# Patient Record
Sex: Female | Born: 1995 | Hispanic: No | Marital: Single | Smoking: Former smoker
Health system: Southern US, Community
[De-identification: ages and names within clinical notes are randomized; demographics above are authoritative.]

## PROBLEM LIST (undated history)

## (undated) DIAGNOSIS — M419 Scoliosis, unspecified: Secondary | ICD-10-CM

## (undated) DIAGNOSIS — J45909 Unspecified asthma, uncomplicated: Secondary | ICD-10-CM

## (undated) DIAGNOSIS — N390 Urinary tract infection, site not specified: Secondary | ICD-10-CM

## (undated) DIAGNOSIS — R569 Unspecified convulsions: Secondary | ICD-10-CM

## (undated) HISTORY — PX: ABDOMINAL SURGERY: SHX537

## (undated) HISTORY — DX: Scoliosis, unspecified: M41.9

---
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
## 2014-05-19 ENCOUNTER — Emergency Department (HOSPITAL_COMMUNITY): Payer: Medicaid Other

## 2014-05-19 ENCOUNTER — Encounter (HOSPITAL_COMMUNITY): Payer: Self-pay | Admitting: Emergency Medicine

## 2014-05-19 ENCOUNTER — Inpatient Hospital Stay (HOSPITAL_COMMUNITY)
Admission: EM | Admit: 2014-05-19 | Discharge: 2014-05-24 | DRG: 956 | Disposition: A | Discharge status: Rehabilitation Facility | Payer: Medicaid Other | Attending: Orthopaedic Surgery | Admitting: Orthopaedic Surgery

## 2014-05-19 DIAGNOSIS — Y92488 Other paved roadways as the place of occurrence of the external cause: Secondary | ICD-10-CM

## 2014-05-19 DIAGNOSIS — T148XXA Other injury of unspecified body region, initial encounter: Secondary | ICD-10-CM

## 2014-05-19 DIAGNOSIS — S72392A Other fracture of shaft of left femur, initial encounter for closed fracture: Secondary | ICD-10-CM | POA: Diagnosis present

## 2014-05-19 DIAGNOSIS — D62 Acute posthemorrhagic anemia: Secondary | ICD-10-CM | POA: Diagnosis not present

## 2014-05-19 DIAGNOSIS — R52 Pain, unspecified: Secondary | ICD-10-CM

## 2014-05-19 DIAGNOSIS — S299XXA Unspecified injury of thorax, initial encounter: Secondary | ICD-10-CM

## 2014-05-19 DIAGNOSIS — J9811 Atelectasis: Secondary | ICD-10-CM | POA: Diagnosis not present

## 2014-05-19 DIAGNOSIS — S060X9A Concussion with loss of consciousness of unspecified duration, initial encounter: Secondary | ICD-10-CM | POA: Diagnosis present

## 2014-05-19 DIAGNOSIS — I2699 Other pulmonary embolism without acute cor pulmonale: Secondary | ICD-10-CM

## 2014-05-19 DIAGNOSIS — J9600 Acute respiratory failure, unspecified whether with hypoxia or hypercapnia: Secondary | ICD-10-CM

## 2014-05-19 DIAGNOSIS — S7291XA Unspecified fracture of right femur, initial encounter for closed fracture: Secondary | ICD-10-CM | POA: Diagnosis present

## 2014-05-19 DIAGNOSIS — R339 Retention of urine, unspecified: Secondary | ICD-10-CM | POA: Diagnosis not present

## 2014-05-19 DIAGNOSIS — E872 Acidosis: Secondary | ICD-10-CM | POA: Diagnosis present

## 2014-05-19 DIAGNOSIS — S060XAA Concussion with loss of consciousness status unknown, initial encounter: Secondary | ICD-10-CM | POA: Diagnosis present

## 2014-05-19 DIAGNOSIS — S72391A Other fracture of shaft of right femur, initial encounter for closed fracture: Principal | ICD-10-CM | POA: Diagnosis present

## 2014-05-19 DIAGNOSIS — I959 Hypotension, unspecified: Secondary | ICD-10-CM | POA: Diagnosis present

## 2014-05-19 DIAGNOSIS — M419 Scoliosis, unspecified: Secondary | ICD-10-CM | POA: Diagnosis present

## 2014-05-19 DIAGNOSIS — R609 Edema, unspecified: Secondary | ICD-10-CM | POA: Diagnosis present

## 2014-05-19 DIAGNOSIS — S27322A Contusion of lung, bilateral, initial encounter: Secondary | ICD-10-CM | POA: Diagnosis present

## 2014-05-19 DIAGNOSIS — Z419 Encounter for procedure for purposes other than remedying health state, unspecified: Secondary | ICD-10-CM

## 2014-05-19 DIAGNOSIS — T07XXXA Unspecified multiple injuries, initial encounter: Secondary | ICD-10-CM

## 2014-05-19 DIAGNOSIS — S270XXA Traumatic pneumothorax, initial encounter: Secondary | ICD-10-CM | POA: Diagnosis present

## 2014-05-19 DIAGNOSIS — S7292XA Unspecified fracture of left femur, initial encounter for closed fracture: Secondary | ICD-10-CM

## 2014-05-19 LAB — I-STAT BETA HCG BLOOD, ED (MC, WL, AP ONLY)

## 2014-05-19 LAB — I-STAT CHEM 8, ED
BUN: 7 mg/dL (ref 6–20)
CALCIUM ION: 1.07 mmol/L — AB (ref 1.12–1.23)
CHLORIDE: 108 mmol/L (ref 101–111)
Creatinine, Ser: 0.4 mg/dL — ABNORMAL LOW (ref 0.44–1.00)
Glucose, Bld: 111 mg/dL — ABNORMAL HIGH (ref 65–99)
HEMATOCRIT: 34 % — AB (ref 36.0–46.0)
Hemoglobin: 11.6 g/dL — ABNORMAL LOW (ref 12.0–15.0)
Potassium: 3.5 mmol/L (ref 3.5–5.1)
Sodium: 142 mmol/L (ref 135–145)
TCO2: 16 mmol/L (ref 0–100)

## 2014-05-19 LAB — PROTIME-INR
INR: 1.26 (ref 0.00–1.49)
PROTHROMBIN TIME: 15.9 s — AB (ref 11.6–15.2)

## 2014-05-19 LAB — COMPREHENSIVE METABOLIC PANEL
ALK PHOS: 39 U/L (ref 38–126)
ALT: 30 U/L (ref 14–54)
ANION GAP: 12 (ref 5–15)
AST: 65 U/L — ABNORMAL HIGH (ref 15–41)
Albumin: 2.9 g/dL — ABNORMAL LOW (ref 3.5–5.0)
BUN: 7 mg/dL (ref 6–20)
CALCIUM: 7.4 mg/dL — AB (ref 8.9–10.3)
CO2: 18 mmol/L — ABNORMAL LOW (ref 22–32)
CREATININE: 0.56 mg/dL (ref 0.44–1.00)
Chloride: 108 mmol/L (ref 101–111)
GFR calc Af Amer: >60 mL/min (ref >60)
Glucose, Bld: 108 mg/dL — ABNORMAL HIGH (ref 65–99)
Potassium: 3.4 mmol/L — ABNORMAL LOW (ref 3.5–5.1)
SODIUM: 138 mmol/L (ref 135–145)
Total Bilirubin: 0.3 mg/dL (ref 0.3–1.2)
Total Protein: 5.9 g/dL — ABNORMAL LOW (ref 6.5–8.1)

## 2014-05-19 LAB — CBC
HEMATOCRIT: 32 % — AB (ref 36.0–46.0)
Hemoglobin: 10.5 g/dL — ABNORMAL LOW (ref 12.0–15.0)
MCH: 28.4 pg (ref 26.0–34.0)
MCHC: 32.8 g/dL (ref 30.0–36.0)
MCV: 86.5 fL (ref 78.0–100.0)
Platelets: 191 10*3/uL (ref 150–400)
RBC: 3.7 MIL/uL — ABNORMAL LOW (ref 3.87–5.11)
RDW: 11.6 % (ref 11.5–15.5)
WBC: 8.3 10*3/uL (ref 4.0–10.5)

## 2014-05-19 LAB — I-STAT CG4 LACTIC ACID, ED: Lactic Acid, Venous: 3.78 mmol/L (ref 0.5–2.0)

## 2014-05-19 LAB — CDS SEROLOGY

## 2014-05-19 LAB — ETHANOL: Alcohol, Ethyl (B): <5 mg/dL (ref <5)

## 2014-05-19 MED ORDER — ONDANSETRON HCL 4 MG/2ML IJ SOLN
4.0000 mg | Freq: Once | INTRAMUSCULAR | Status: AC
  Administered 2014-05-19: 4 mg via INTRAVENOUS

## 2014-05-19 MED ORDER — SODIUM CHLORIDE 0.9 % IV SOLN
Freq: Once | INTRAVENOUS | Status: AC
  Administered 2014-05-19: 23:00:00 via INTRAVENOUS

## 2014-05-19 MED ORDER — HYDROMORPHONE HCL 1 MG/ML IJ SOLN
1.0000 mg | Freq: Once | INTRAMUSCULAR | Status: AC
  Administered 2014-05-19: 1 mg via INTRAVENOUS

## 2014-05-19 MED ORDER — FENTANYL CITRATE (PF) 100 MCG/2ML IJ SOLN
50.0000 ug | Freq: Once | INTRAMUSCULAR | Status: AC
Start: 2014-05-19 — End: 2014-05-19
  Administered 2014-05-19: 50 ug via INTRAVENOUS

## 2014-05-19 MED ORDER — FENTANYL CITRATE (PF) 100 MCG/2ML IJ SOLN
50.0000 ug | Freq: Once | INTRAMUSCULAR | Status: AC
  Administered 2014-05-19: 50 ug via INTRAVENOUS

## 2014-05-19 MED ORDER — SODIUM CHLORIDE 0.9 % IV BOLUS (SEPSIS)
1000.0000 mL | Freq: Once | INTRAVENOUS | Status: AC
  Administered 2014-05-19: 1000 mL via INTRAVENOUS

## 2014-05-19 MED ORDER — HYDROMORPHONE HCL 1 MG/ML IJ SOLN
INTRAMUSCULAR | Status: AC
  Filled 2014-05-19: qty 1

## 2014-05-19 NOTE — ED Notes (Signed)
Duplicate orders discontinued.  
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
## 2014-05-19 NOTE — Progress Notes (Signed)
Orthopedic Tech Progress Note Patient Details:  Theophilus BonesLena Howdeshell 03/03/1995 [id number]  Patient ID: Theophilus BonesLena Buckel, female   DOB: 03/03/1995, 19 y.o.   MRN: [id number]   Shawnie PonsCammer, Nel Stoneking Carol 05/19/2014, 10:47 PMlevel 1 trauma

## 2014-05-19 NOTE — ED Notes (Signed)
Ortho tech paged again

## 2014-05-19 NOTE — ED Notes (Signed)
Ortho tech paged  
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
## 2014-05-19 NOTE — ED Notes (Signed)
Dr Rhunette CroftNanavati given a copy of lactic acid results 3.78

## 2014-05-20 ENCOUNTER — Inpatient Hospital Stay (HOSPITAL_COMMUNITY): Payer: Medicaid Other

## 2014-05-20 ENCOUNTER — Encounter (HOSPITAL_COMMUNITY): Payer: Self-pay | Admitting: Certified Registered Nurse Anesthetist

## 2014-05-20 ENCOUNTER — Inpatient Hospital Stay (HOSPITAL_COMMUNITY): Payer: Medicaid Other | Admitting: Anesthesiology

## 2014-05-20 ENCOUNTER — Encounter (HOSPITAL_COMMUNITY): Admission: EM | Disposition: A | Discharge status: Rehabilitation Facility | Payer: Self-pay | Source: Home / Self Care

## 2014-05-20 DIAGNOSIS — T07XXXA Unspecified multiple injuries, initial encounter: Secondary | ICD-10-CM

## 2014-05-20 DIAGNOSIS — S72301S Unspecified fracture of shaft of right femur, sequela: Secondary | ICD-10-CM | POA: Diagnosis not present

## 2014-05-20 DIAGNOSIS — S069X1S Unspecified intracranial injury with loss of consciousness of 30 minutes or less, sequela: Secondary | ICD-10-CM | POA: Diagnosis not present

## 2014-05-20 DIAGNOSIS — S72392A Other fracture of shaft of left femur, initial encounter for closed fracture: Secondary | ICD-10-CM | POA: Diagnosis present

## 2014-05-20 DIAGNOSIS — I959 Hypotension, unspecified: Secondary | ICD-10-CM | POA: Diagnosis present

## 2014-05-20 DIAGNOSIS — D62 Acute posthemorrhagic anemia: Secondary | ICD-10-CM | POA: Diagnosis not present

## 2014-05-20 DIAGNOSIS — R339 Retention of urine, unspecified: Secondary | ICD-10-CM | POA: Diagnosis not present

## 2014-05-20 DIAGNOSIS — S060X9A Concussion with loss of consciousness of unspecified duration, initial encounter: Secondary | ICD-10-CM | POA: Diagnosis present

## 2014-05-20 DIAGNOSIS — S270XXA Traumatic pneumothorax, initial encounter: Secondary | ICD-10-CM | POA: Diagnosis present

## 2014-05-20 DIAGNOSIS — S7292XA Unspecified fracture of left femur, initial encounter for closed fracture: Secondary | ICD-10-CM

## 2014-05-20 DIAGNOSIS — S7291XA Unspecified fracture of right femur, initial encounter for closed fracture: Secondary | ICD-10-CM | POA: Diagnosis present

## 2014-05-20 DIAGNOSIS — S7291XS Unspecified fracture of right femur, sequela: Secondary | ICD-10-CM | POA: Diagnosis not present

## 2014-05-20 DIAGNOSIS — M419 Scoliosis, unspecified: Secondary | ICD-10-CM | POA: Diagnosis present

## 2014-05-20 DIAGNOSIS — Y92488 Other paved roadways as the place of occurrence of the external cause: Secondary | ICD-10-CM | POA: Diagnosis not present

## 2014-05-20 DIAGNOSIS — J9811 Atelectasis: Secondary | ICD-10-CM | POA: Diagnosis not present

## 2014-05-20 DIAGNOSIS — S060XAA Concussion with loss of consciousness status unknown, initial encounter: Secondary | ICD-10-CM

## 2014-05-20 DIAGNOSIS — S72302S Unspecified fracture of shaft of left femur, sequela: Secondary | ICD-10-CM | POA: Diagnosis not present

## 2014-05-20 DIAGNOSIS — S7291XF Unspecified fracture of right femur, subsequent encounter for open fracture type IIIA, IIIB, or IIIC with routine healing: Secondary | ICD-10-CM | POA: Diagnosis not present

## 2014-05-20 DIAGNOSIS — S7292XF Unspecified fracture of left femur, subsequent encounter for open fracture type IIIA, IIIB, or IIIC with routine healing: Secondary | ICD-10-CM | POA: Diagnosis not present

## 2014-05-20 DIAGNOSIS — M79606 Pain in leg, unspecified: Secondary | ICD-10-CM | POA: Diagnosis present

## 2014-05-20 DIAGNOSIS — S27322A Contusion of lung, bilateral, initial encounter: Secondary | ICD-10-CM

## 2014-05-20 DIAGNOSIS — S060X1S Concussion with loss of consciousness of 30 minutes or less, sequela: Secondary | ICD-10-CM | POA: Diagnosis not present

## 2014-05-20 DIAGNOSIS — S7292XS Unspecified fracture of left femur, sequela: Secondary | ICD-10-CM | POA: Diagnosis not present

## 2014-05-20 DIAGNOSIS — S062X1S Diffuse traumatic brain injury with loss of consciousness of 30 minutes or less, sequela: Secondary | ICD-10-CM | POA: Diagnosis not present

## 2014-05-20 DIAGNOSIS — S062X1D Diffuse traumatic brain injury with loss of consciousness of 30 minutes or less, subsequent encounter: Secondary | ICD-10-CM | POA: Diagnosis not present

## 2014-05-20 DIAGNOSIS — S72391A Other fracture of shaft of right femur, initial encounter for closed fracture: Secondary | ICD-10-CM | POA: Diagnosis present

## 2014-05-20 DIAGNOSIS — E872 Acidosis: Secondary | ICD-10-CM | POA: Diagnosis present

## 2014-05-20 DIAGNOSIS — R609 Edema, unspecified: Secondary | ICD-10-CM | POA: Diagnosis present

## 2014-05-20 HISTORY — DX: Unspecified fracture of right femur, initial encounter for closed fracture: S72.91XA

## 2014-05-20 HISTORY — DX: Contusion of lung, bilateral, initial encounter: S27.322A

## 2014-05-20 HISTORY — PX: FEMUR IM NAIL: SHX1597

## 2014-05-20 HISTORY — DX: Rider (driver) (passenger) of other motorcycle injured in unspecified traffic accident, initial encounter: V29.99XA

## 2014-05-20 HISTORY — DX: Concussion with loss of consciousness status unknown, initial encounter: S06.0XAA

## 2014-05-20 LAB — PREPARE FRESH FROZEN PLASMA
UNIT DIVISION: 0
Unit division: 0

## 2014-05-20 LAB — CBC
HCT: 32.2 % — ABNORMAL LOW (ref 36.0–46.0)
HEMOGLOBIN: 10.4 g/dL — AB (ref 12.0–15.0)
MCH: 28.4 pg (ref 26.0–34.0)
MCHC: 32.3 g/dL (ref 30.0–36.0)
MCV: 88 fL (ref 78.0–100.0)
Platelets: 175 10*3/uL (ref 150–400)
RBC: 3.66 MIL/uL — AB (ref 3.87–5.11)
RDW: 11.9 % (ref 11.5–15.5)
WBC: 10 10*3/uL (ref 4.0–10.5)

## 2014-05-20 LAB — PROTIME-INR
INR: 1.19 (ref 0.00–1.49)
Prothrombin Time: 15.2 seconds (ref 11.6–15.2)

## 2014-05-20 LAB — BASIC METABOLIC PANEL
Anion gap: 7 (ref 5–15)
CHLORIDE: 112 mmol/L — AB (ref 101–111)
CO2: 20 mmol/L — ABNORMAL LOW (ref 22–32)
CREATININE: 0.61 mg/dL (ref 0.44–1.00)
Calcium: 7.2 mg/dL — ABNORMAL LOW (ref 8.9–10.3)
GFR calc Af Amer: >60 mL/min (ref >60)
GFR calc non Af Amer: >60 mL/min (ref >60)
Glucose, Bld: 111 mg/dL — ABNORMAL HIGH (ref 65–99)
Potassium: 4.1 mmol/L (ref 3.5–5.1)
Sodium: 139 mmol/L (ref 135–145)

## 2014-05-20 LAB — MRSA PCR SCREENING: MRSA BY PCR: NEGATIVE

## 2014-05-20 LAB — ABO/RH: ABO/RH(D): O POS

## 2014-05-20 LAB — TSH: TSH: 2.656 u[IU]/mL (ref 0.350–4.500)

## 2014-05-20 SURGERY — INSERTION, INTRAMEDULLARY ROD, FEMUR, RETROGRADE
Anesthesia: General | Site: Leg Upper | Laterality: Bilateral

## 2014-05-20 MED ORDER — LIDOCAINE HCL (CARDIAC) 20 MG/ML IV SOLN
INTRAVENOUS | Status: AC
  Filled 2014-05-20: qty 5

## 2014-05-20 MED ORDER — LIDOCAINE HCL (CARDIAC) 20 MG/ML IV SOLN
INTRAVENOUS | Status: DC | PRN
  Administered 2014-05-20: 40 mg via INTRAVENOUS

## 2014-05-20 MED ORDER — HYDROMORPHONE HCL 1 MG/ML IJ SOLN
1.0000 mg | Freq: Once | INTRAMUSCULAR | Status: AC
  Administered 2014-05-20: 1 mg via INTRAVENOUS

## 2014-05-20 MED ORDER — BUPIVACAINE HCL (PF) 0.25 % IJ SOLN
INTRAMUSCULAR | Status: AC
  Filled 2014-05-20: qty 60

## 2014-05-20 MED ORDER — ACETAMINOPHEN 10 MG/ML IV SOLN: 650.0000 mg | Freq: Once | INTRAVENOUS | Status: DC

## 2014-05-20 MED ORDER — POTASSIUM CHLORIDE IN NACL 20-0.9 MEQ/L-% IV SOLN
INTRAVENOUS | Status: DC
  Administered 2014-05-20: 03:00:00 via INTRAVENOUS
  Filled 2014-05-20 (×3): qty 1000

## 2014-05-20 MED ORDER — ONDANSETRON HCL 4 MG/2ML IJ SOLN
INTRAMUSCULAR | Status: DC | PRN
  Administered 2014-05-20: 4 mg via INTRAVENOUS

## 2014-05-20 MED ORDER — OXYCODONE HCL 5 MG PO TABS: 5.0000 mg | ORAL_TABLET | ORAL | Status: DC | PRN

## 2014-05-20 MED ORDER — METHOCARBAMOL 500 MG PO TABS
500.0000 mg | ORAL_TABLET | Freq: Four times a day (QID) | ORAL | Status: DC | PRN
  Administered 2014-05-20: 500 mg via ORAL
  Filled 2014-05-20 (×2): qty 1

## 2014-05-20 MED ORDER — OXYCODONE HCL 5 MG/5ML PO SOLN: 5.0000 mg | Freq: Once | ORAL | Status: AC | PRN

## 2014-05-20 MED ORDER — PHENYLEPHRINE HCL 10 MG/ML IJ SOLN
10.0000 mg | INTRAVENOUS | Status: DC | PRN
  Administered 2014-05-20: 10 ug/min via INTRAVENOUS

## 2014-05-20 MED ORDER — HYDROMORPHONE HCL 1 MG/ML IJ SOLN
0.5000 mg | INTRAMUSCULAR | Status: DC | PRN
  Administered 2014-05-20 – 2014-05-22 (×6): 0.5 mg via INTRAVENOUS
  Filled 2014-05-20 (×6): qty 1

## 2014-05-20 MED ORDER — ACETAMINOPHEN 650 MG RE SUPP: 650.0000 mg | Freq: Four times a day (QID) | RECTAL | Status: DC | PRN

## 2014-05-20 MED ORDER — HYDROCODONE-ACETAMINOPHEN 7.5-325 MG PO TABS: 1.0000 | ORAL_TABLET | Freq: Once | ORAL | Status: DC | PRN

## 2014-05-20 MED ORDER — ONDANSETRON HCL 4 MG PO TABS: 4.0000 mg | ORAL_TABLET | Freq: Four times a day (QID) | ORAL | Status: DC | PRN

## 2014-05-20 MED ORDER — ENOXAPARIN SODIUM 30 MG/0.3ML ~~LOC~~ SOLN
30.0000 mg | SUBCUTANEOUS | Status: DC
  Administered 2014-05-21 – 2014-05-24 (×4): 30 mg via SUBCUTANEOUS
  Filled 2014-05-20 (×5): qty 0.3

## 2014-05-20 MED ORDER — VECURONIUM BROMIDE 10 MG IV SOLR
INTRAVENOUS | Status: DC | PRN
  Administered 2014-05-20: 2 mg via INTRAVENOUS

## 2014-05-20 MED ORDER — OXYCODONE HCL 5 MG PO TABS
5.0000 mg | ORAL_TABLET | Freq: Once | ORAL | Status: AC | PRN
  Administered 2014-05-20: 5 mg via ORAL

## 2014-05-20 MED ORDER — HYDROMORPHONE HCL 1 MG/ML IJ SOLN
INTRAMUSCULAR | Status: AC
  Filled 2014-05-20: qty 1

## 2014-05-20 MED ORDER — CEFAZOLIN SODIUM-DEXTROSE 2-3 GM-% IV SOLR
INTRAVENOUS | Status: DC | PRN
  Administered 2014-05-20: 2 g via INTRAVENOUS

## 2014-05-20 MED ORDER — HYDROCODONE-ACETAMINOPHEN 10-325 MG PO TABS
0.5000 | ORAL_TABLET | ORAL | Status: DC | PRN
  Administered 2014-05-20 (×2): 2 via ORAL
  Administered 2014-05-21 – 2014-05-23 (×5): 1 via ORAL
  Administered 2014-05-24 (×2): 2 via ORAL
  Filled 2014-05-20 (×3): qty 1
  Filled 2014-05-20 (×2): qty 2
  Filled 2014-05-20: qty 1
  Filled 2014-05-20 (×3): qty 2

## 2014-05-20 MED ORDER — HEPARIN SODIUM (PORCINE) 5000 UNIT/ML IJ SOLN
5000.0000 [IU] | Freq: Once | INTRAMUSCULAR | Status: AC
  Administered 2014-05-20: 5000 [IU] via SUBCUTANEOUS
  Filled 2014-05-20: qty 1

## 2014-05-20 MED ORDER — PANTOPRAZOLE SODIUM 40 MG PO TBEC: 40.0000 mg | DELAYED_RELEASE_TABLET | Freq: Every day | ORAL | Status: DC

## 2014-05-20 MED ORDER — PROPOFOL 10 MG/ML IV BOLUS
INTRAVENOUS | Status: AC
  Filled 2014-05-20: qty 20

## 2014-05-20 MED ORDER — DIPHENHYDRAMINE HCL 12.5 MG/5ML PO ELIX
25.0000 mg | ORAL_SOLUTION | ORAL | Status: DC | PRN
  Filled 2014-05-20: qty 10

## 2014-05-20 MED ORDER — ALBUMIN HUMAN 5 % IV SOLN
INTRAVENOUS | Status: DC | PRN
  Administered 2014-05-20: 08:00:00 via INTRAVENOUS

## 2014-05-20 MED ORDER — DOCUSATE SODIUM 100 MG PO CAPS
100.0000 mg | ORAL_CAPSULE | Freq: Two times a day (BID) | ORAL | Status: DC
  Administered 2014-05-20 – 2014-05-24 (×6): 100 mg via ORAL
  Filled 2014-05-20 (×5): qty 1

## 2014-05-20 MED ORDER — 0.9 % SODIUM CHLORIDE (POUR BTL) OPTIME
TOPICAL | Status: DC | PRN
  Administered 2014-05-20: 1000 mL

## 2014-05-20 MED ORDER — PANTOPRAZOLE SODIUM 40 MG IV SOLR
40.0000 mg | Freq: Every day | INTRAVENOUS | Status: DC
  Filled 2014-05-20: qty 40

## 2014-05-20 MED ORDER — POLYETHYLENE GLYCOL 3350 17 G PO PACK
17.0000 g | PACK | Freq: Every day | ORAL | Status: DC
  Administered 2014-05-20 – 2014-05-24 (×3): 17 g via ORAL
  Filled 2014-05-20 (×5): qty 1

## 2014-05-20 MED ORDER — KETOROLAC TROMETHAMINE 30 MG/ML IJ SOLN
30.0000 mg | Freq: Once | INTRAMUSCULAR | Status: AC | PRN
  Administered 2014-05-20: 30 mg via INTRAVENOUS

## 2014-05-20 MED ORDER — WHITE PETROLATUM GEL
Status: AC
  Administered 2014-05-20: 02:00:00
  Filled 2014-05-20: qty 1

## 2014-05-20 MED ORDER — FENTANYL CITRATE (PF) 250 MCG/5ML IJ SOLN
INTRAMUSCULAR | Status: AC
  Filled 2014-05-20: qty 5

## 2014-05-20 MED ORDER — NEOSTIGMINE METHYLSULFATE 10 MG/10ML IV SOLN
INTRAVENOUS | Status: DC | PRN
  Administered 2014-05-20: 2 mg via INTRAVENOUS

## 2014-05-20 MED ORDER — ONDANSETRON HCL 4 MG/2ML IJ SOLN
4.0000 mg | Freq: Four times a day (QID) | INTRAMUSCULAR | Status: DC | PRN
  Administered 2014-05-20 – 2014-05-22 (×4): 4 mg via INTRAVENOUS
  Filled 2014-05-20 (×5): qty 2

## 2014-05-20 MED ORDER — PROPOFOL 10 MG/ML IV BOLUS
INTRAVENOUS | Status: DC | PRN
  Administered 2014-05-20: 100 mg via INTRAVENOUS

## 2014-05-20 MED ORDER — MEPERIDINE HCL 25 MG/ML IJ SOLN: 6.2500 mg | INTRAMUSCULAR | Status: DC | PRN

## 2014-05-20 MED ORDER — ENOXAPARIN SODIUM 30 MG/0.3ML ~~LOC~~ SOLN
30.0000 mg | SUBCUTANEOUS | Status: DC
  Filled 2014-05-20: qty 0.3

## 2014-05-20 MED ORDER — MIDAZOLAM HCL 5 MG/5ML IJ SOLN
INTRAMUSCULAR | Status: DC | PRN
  Administered 2014-05-20 (×2): 1 mg via INTRAVENOUS

## 2014-05-20 MED ORDER — OXYCODONE HCL 5 MG PO TABS
ORAL_TABLET | ORAL | Status: AC
  Filled 2014-05-20: qty 1

## 2014-05-20 MED ORDER — GLYCOPYRROLATE 0.2 MG/ML IJ SOLN
INTRAMUSCULAR | Status: DC | PRN
  Administered 2014-05-20: 0.2 mg via INTRAVENOUS

## 2014-05-20 MED ORDER — MIDAZOLAM HCL 2 MG/2ML IJ SOLN
INTRAMUSCULAR | Status: AC
  Filled 2014-05-20: qty 2

## 2014-05-20 MED ORDER — ACETAMINOPHEN 325 MG PO TABS
650.0000 mg | ORAL_TABLET | Freq: Once | ORAL | Status: AC
  Administered 2014-05-20: 650 mg via ORAL
  Filled 2014-05-20: qty 2

## 2014-05-20 MED ORDER — ONDANSETRON HCL 4 MG/2ML IJ SOLN: 4.0000 mg | Freq: Four times a day (QID) | INTRAMUSCULAR | Status: DC | PRN

## 2014-05-20 MED ORDER — PHENYLEPHRINE HCL 10 MG/ML IJ SOLN
INTRAMUSCULAR | Status: DC | PRN
  Administered 2014-05-20 (×2): 80 ug via INTRAVENOUS

## 2014-05-20 MED ORDER — ENOXAPARIN SODIUM 40 MG/0.4ML ~~LOC~~ SOLN
40.0000 mg | SUBCUTANEOUS | Status: DC
  Filled 2014-05-20: qty 0.4

## 2014-05-20 MED ORDER — HYDROMORPHONE HCL 1 MG/ML IJ SOLN
INTRAMUSCULAR | Status: AC
  Administered 2014-05-20: 02:00:00
  Filled 2014-05-20: qty 1

## 2014-05-20 MED ORDER — FENTANYL CITRATE (PF) 100 MCG/2ML IJ SOLN
INTRAMUSCULAR | Status: DC | PRN
  Administered 2014-05-20: 50 ug via INTRAVENOUS
  Administered 2014-05-20: 25 ug via INTRAVENOUS
  Administered 2014-05-20 (×2): 50 ug via INTRAVENOUS
  Administered 2014-05-20: 25 ug via INTRAVENOUS
  Administered 2014-05-20: 100 ug via INTRAVENOUS

## 2014-05-20 MED ORDER — KETOROLAC TROMETHAMINE 30 MG/ML IJ SOLN
INTRAMUSCULAR | Status: AC
  Filled 2014-05-20: qty 1

## 2014-05-20 MED ORDER — ROCURONIUM BROMIDE 100 MG/10ML IV SOLN
INTRAVENOUS | Status: DC | PRN
Start: 2014-05-20 — End: 2014-05-20
  Administered 2014-05-20: 40 mg via INTRAVENOUS
  Administered 2014-05-20: 10 mg via INTRAVENOUS

## 2014-05-20 MED ORDER — METOCLOPRAMIDE HCL 5 MG/ML IJ SOLN
5.0000 mg | Freq: Three times a day (TID) | INTRAMUSCULAR | Status: DC | PRN
  Administered 2014-05-21: 10 mg via INTRAVENOUS
  Filled 2014-05-20 (×2): qty 2

## 2014-05-20 MED ORDER — ACETAMINOPHEN 325 MG PO TABS
650.0000 mg | ORAL_TABLET | Freq: Four times a day (QID) | ORAL | Status: DC | PRN
  Administered 2014-05-21: 475 mg via ORAL
  Administered 2014-05-21 – 2014-05-22 (×2): 650 mg via ORAL
  Filled 2014-05-20 (×4): qty 2

## 2014-05-20 MED ORDER — ONDANSETRON HCL 4 MG/2ML IJ SOLN: 4.0000 mg | Freq: Once | INTRAMUSCULAR | Status: DC | PRN

## 2014-05-20 MED ORDER — ROCURONIUM BROMIDE 50 MG/5ML IV SOLN
INTRAVENOUS | Status: AC
  Filled 2014-05-20: qty 1

## 2014-05-20 MED ORDER — METHOCARBAMOL 500 MG PO TABS
ORAL_TABLET | ORAL | Status: AC
  Filled 2014-05-20: qty 1

## 2014-05-20 MED ORDER — METOCLOPRAMIDE HCL 5 MG PO TABS
5.0000 mg | ORAL_TABLET | Freq: Three times a day (TID) | ORAL | Status: DC | PRN
  Filled 2014-05-20: qty 2

## 2014-05-20 MED ORDER — HYDROMORPHONE HCL 1 MG/ML IJ SOLN
0.2500 mg | INTRAMUSCULAR | Status: DC | PRN
  Administered 2014-05-20: 0.5 mg via INTRAVENOUS

## 2014-05-20 MED ORDER — LACTATED RINGERS IV SOLN
INTRAVENOUS | Status: DC | PRN
  Administered 2014-05-20 (×2): via INTRAVENOUS

## 2014-05-20 MED ORDER — SODIUM CHLORIDE 0.9 % IV SOLN
INTRAVENOUS | Status: DC
  Administered 2014-05-20: 21:00:00 via INTRAVENOUS
  Administered 2014-05-21: 125 mL/h via INTRAVENOUS
  Administered 2014-05-22: 03:00:00 via INTRAVENOUS

## 2014-05-20 MED ORDER — ONDANSETRON HCL 4 MG/2ML IJ SOLN
INTRAMUSCULAR | Status: AC
  Filled 2014-05-20: qty 2

## 2014-05-20 MED ORDER — CEFAZOLIN SODIUM-DEXTROSE 2-3 GM-% IV SOLR
2.0000 g | Freq: Four times a day (QID) | INTRAVENOUS | Status: AC
  Administered 2014-05-20 – 2014-05-21 (×3): 2 g via INTRAVENOUS
  Filled 2014-05-20 (×3): qty 50

## 2014-05-20 MED ORDER — METHOCARBAMOL 1000 MG/10ML IJ SOLN
500.0000 mg | Freq: Four times a day (QID) | INTRAVENOUS | Status: DC | PRN
  Administered 2014-05-22 – 2014-05-23 (×2): 500 mg via INTRAVENOUS
  Filled 2014-05-20 (×4): qty 5

## 2014-05-20 MED ORDER — HYDROMORPHONE HCL 1 MG/ML IJ SOLN
1.0000 mg | INTRAMUSCULAR | Status: DC | PRN
  Administered 2014-05-20: 1 mg via INTRAVENOUS
  Filled 2014-05-20: qty 1

## 2014-05-20 SURGICAL SUPPLY — 68 items
BIT DRILL LONG 4.0 (BIT) ×1 IMPLANT
BIT DRILL SHORT 4.0 (BIT) ×3 IMPLANT
BLADE SURG 15 STRL LF DISP TIS (BLADE) ×1 IMPLANT
BLADE SURG 15 STRL SS (BLADE) ×1
BNDG COHESIVE 6X5 TAN STRL LF (GAUZE/BANDAGES/DRESSINGS) ×4 IMPLANT
COVER SURGICAL LIGHT HANDLE (MISCELLANEOUS) ×4 IMPLANT
DRAPE C-ARM 42X72 X-RAY (DRAPES) ×4 IMPLANT
DRAPE C-ARMOR (DRAPES) ×4 IMPLANT
DRAPE EXTREMITY BILATERAL (DRAPE) ×2 IMPLANT
DRAPE IMP U-DRAPE 54X76 (DRAPES) ×2 IMPLANT
DRAPE INCISE IOBAN 66X45 STRL (DRAPES) ×2 IMPLANT
DRAPE ORTHO SPLIT 77X108 STRL (DRAPES) ×2
DRAPE PROXIMA HALF (DRAPES) ×4 IMPLANT
DRAPE SURG ORHT 6 SPLT 77X108 (DRAPES) ×2 IMPLANT
DRAPE U-SHAPE 47X51 STRL (DRAPES) ×4 IMPLANT
DRILL BIT LONG 4.0 (BIT) ×2
DRILL BIT SHORT 4.0 (BIT) ×3
DRSG MEPILEX BORDER 4X4 (GAUZE/BANDAGES/DRESSINGS) ×6 IMPLANT
DRSG MEPILEX BORDER 4X8 (GAUZE/BANDAGES/DRESSINGS) ×6 IMPLANT
DURAPREP 26ML APPLICATOR (WOUND CARE) ×4 IMPLANT
ELECT REM PT RETURN 9FT ADLT (ELECTROSURGICAL) ×2
ELECTRODE REM PT RTRN 9FT ADLT (ELECTROSURGICAL) ×1 IMPLANT
FACESHIELD STD STERILE (MASK) ×6 IMPLANT
GAUZE XEROFORM 5X9 LF (GAUZE/BANDAGES/DRESSINGS) ×2 IMPLANT
GLOVE BIO SURGEON STRL SZ7 (GLOVE) ×2 IMPLANT
GLOVE BIOGEL PI IND STRL 6.5 (GLOVE) ×1 IMPLANT
GLOVE BIOGEL PI IND STRL 7.0 (GLOVE) ×2 IMPLANT
GLOVE BIOGEL PI INDICATOR 6.5 (GLOVE) ×1
GLOVE BIOGEL PI INDICATOR 7.0 (GLOVE) ×2
GLOVE ECLIPSE 6.5 STRL STRAW (GLOVE) ×2 IMPLANT
GLOVE NEODERM STRL 7.5 LF PF (GLOVE) ×1 IMPLANT
GLOVE SURG NEODERM 7.5  LF PF (GLOVE) ×1
GLOVE SURG SS PI 7.0 STRL IVOR (GLOVE) ×2 IMPLANT
GLOVE SURG SS PI 8.0 STRL IVOR (GLOVE) ×4 IMPLANT
GOWN STRL REIN XL XLG (GOWN DISPOSABLE) ×2 IMPLANT
GOWN STRL REUS W/ TWL LRG LVL3 (GOWN DISPOSABLE) ×2 IMPLANT
GOWN STRL REUS W/ TWL XL LVL3 (GOWN DISPOSABLE) ×2 IMPLANT
GOWN STRL REUS W/TWL LRG LVL3 (GOWN DISPOSABLE) ×2
GOWN STRL REUS W/TWL XL LVL3 (GOWN DISPOSABLE) ×2
GUIDE PIN 3.2MM (MISCELLANEOUS) ×1
GUIDE PIN ORTH 343X3.2XBRAD (MISCELLANEOUS) ×1 IMPLANT
GUIDE ROD 3.0 (MISCELLANEOUS) ×2
KIT BASIN OR (CUSTOM PROCEDURE TRAY) ×2 IMPLANT
KIT ROOM TURNOVER OR (KITS) ×2 IMPLANT
MANIFOLD NEPTUNE II (INSTRUMENTS) ×2 IMPLANT
NAIL RETROGRADE 10X34 (Nail) ×4 IMPLANT
NS IRRIG 1000ML POUR BTL (IV SOLUTION) ×4 IMPLANT
PACK GENERAL/GYN (CUSTOM PROCEDURE TRAY) ×2 IMPLANT
PAD ARMBOARD 7.5X6 YLW CONV (MISCELLANEOUS) ×4 IMPLANT
PADDING CAST COTTON 6X4 STRL (CAST SUPPLIES) ×2 IMPLANT
ROD GUIDE 3.0 (MISCELLANEOUS) ×1 IMPLANT
SCREW TRIGEN LOW PROF 5.0X30 (Screw) ×4 IMPLANT
SCREW TRIGEN LOW PROF 5.0X32.5 (Screw) ×4 IMPLANT
SCREW TRIGEN LOW PROF 5.0X42.5 (Screw) ×2 IMPLANT
SCREW TRIGEN LOW PROF 5.0X45 (Screw) ×2 IMPLANT
SCREW TRIGEN LOW PROF 5.0X60 (Screw) ×4 IMPLANT
STAPLER VISISTAT 35W (STAPLE) ×2 IMPLANT
STOCKINETTE 6  STRL (DRAPES) ×1
STOCKINETTE 6 STRL (DRAPES) ×1 IMPLANT
STOCKINETTE IMPERVIOUS LG (DRAPES) ×4 IMPLANT
SUT VIC AB 0 CTB1 27 (SUTURE) ×4 IMPLANT
SUT VIC AB 2-0 CT1 27 (SUTURE) ×2
SUT VIC AB 2-0 CT1 TAPERPNT 27 (SUTURE) ×2 IMPLANT
SUT VIC AB 2-0 CTB1 (SUTURE) ×2 IMPLANT
SYR 20ML ECCENTRIC (SYRINGE) ×2 IMPLANT
TOWEL OR 17X24 6PK STRL BLUE (TOWEL DISPOSABLE) ×2 IMPLANT
TOWEL OR 17X26 10 PK STRL BLUE (TOWEL DISPOSABLE) ×2 IMPLANT
WATER STERILE IRR 1000ML POUR (IV SOLUTION) ×2 IMPLANT

## 2014-05-20 NOTE — Progress Notes (Signed)
UR completed.  Await surgical repair to know exact d/c needs.  Carlyle LipaMichelle Trista Ciocca, RN BSN MHA CCM Trauma/Neuro ICU Case Manager [phone]

## 2014-05-20 NOTE — Transfer of Care (Signed)
Immediate Anesthesia Transfer of Care Note  Patient: Tracy BonesLena Sandiford  Procedure(s) Performed: Procedure(s): INTRAMEDULLARY (IM) RETROGRADE FEMORAL NAILING (Bilateral)  Patient Location: PACU  Anesthesia Type:General  Level of Consciousness: awake, alert  and oriented  Airway & Oxygen Therapy: Patient Spontanous Breathing and Patient connected to face mask oxygen  Post-op Assessment: Report given to RN and Post -op Vital signs reviewed and stable  Post vital signs: Reviewed and stable  Last Vitals:  Filed Vitals:   05/20/14 0504  BP:   Pulse:   Temp: 38 C  Resp:     Complications: No apparent anesthesia complications

## 2014-05-20 NOTE — Discharge Instructions (Signed)
Postoperative instructions:  Weightbearing: as tolerated  Keep your dressing and/or splint clean and dry at all times.  You can remove your dressing on post-operative day #3 and change with a dry/sterile dressing as needed thereafter.    Incision instructions:  Do not soak your incision for 3 weeks after surgery.  If the incision gets wet, pat dry and do not scrub the incision.  Pain control:  You have been given a prescription to be taken as directed for post-operative pain control.  In addition, elevate the operative extremity above the heart at all times to prevent swelling and throbbing pain.  Take over-the-counter Colace, 100mg  by mouth twice a day while taking narcotic pain medications to help prevent constipation.  Follow up appointments: 1) 10-14 days for suture removal and wound check. 2) Dr. Roda ShuttersXu as scheduled.   -------------------------------------------------------------------------------------------------------------  After Surgery Pain Control:  After your surgery, post-surgical discomfort or pain is likely. This discomfort can last several days to a few weeks. At certain times of the day your discomfort may be more intense.  Did you receive a nerve block?  A nerve block can provide pain relief for one hour to two days after your surgery. As long as the nerve block is working, you will experience little or no sensation in the area the surgeon operated on.  As the nerve block wears off, you will begin to experience pain or discomfort. It is very important that you begin taking your prescribed pain medication before the nerve block fully wears off. Treating your pain at the first sign of the block wearing off will ensure your pain is better controlled and more tolerable when full-sensation returns. Do not wait until the pain is intolerable, as the medicine will be less effective. It is better to treat pain in advance than to try and catch up.  General Anesthesia:  If you did not  receive a nerve block during your surgery, you will need to start taking your pain medication shortly after your surgery and should continue to do so as prescribed by your surgeon.  Pain Medication:  Most commonly we prescribe Vicodin and Percocet for post-operative pain. Both of these medications contain a combination of acetaminophen (Tylenol) and a narcotic to help control pain.   It takes between 30 and 45 minutes before pain medication starts to work. It is important to take your medication before your pain level gets too intense.   Nausea is a common side effect of many pain medications. You will want to eat something before taking your pain medicine to help prevent nausea.   If you are taking a prescription pain medication that contains acetaminophen, we recommend that you do not take additional over the counter acetaminophen (Tylenol).  Other pain relieving options:   Using a cold pack to ice the affected area a few times a day (15 to 20 minutes at a time) can help to relieve pain, reduce swelling and bruising.   Elevation of the affected area can also help to reduce pain and swelling.
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
## 2014-05-20 NOTE — ED Notes (Signed)
Attempted to give report 

## 2014-05-20 NOTE — Progress Notes (Signed)
Orthopedic Tech Progress Note Patient Details:  Tracy BonesLena Galan 05-29-1995 [id number]  Ortho Devices Ortho Device/Splint Location: trapeze bar patient helper Ortho Device/Splint Interventions: Application   Nikki Domrawford, Jolonda Gomm 05/20/2014, 5:30 PM

## 2014-05-20 NOTE — ED Notes (Signed)
3L 02 applied in CT to pt.
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
## 2014-05-20 NOTE — ED Notes (Signed)
Admitting MD paged 

## 2014-05-20 NOTE — Anesthesia Procedure Notes (Signed)
Procedure Name: Intubation Date/Time: 05/20/2014 7:43 AM Performed by: Dairl PonderJIANG, Hadriel Northup Pre-anesthesia Checklist: Patient identified, Timeout performed, Emergency Drugs available, Suction available and Patient being monitored Patient Re-evaluated:Patient Re-evaluated prior to inductionOxygen Delivery Method: Circle system utilized Preoxygenation: Pre-oxygenation with 100% oxygen Intubation Type: IV induction Laryngoscope Size: Mac and 3 Grade View: Grade I Tube type: Oral Tube size: 7.0 mm Number of attempts: 1 Placement Confirmation: ETT inserted through vocal cords under direct vision,  breath sounds checked- equal and bilateral and positive ETCO2 Secured at: 21 cm Tube secured with: Tape Dental Injury: Teeth and Oropharynx as per pre-operative assessment

## 2014-05-20 NOTE — ED Notes (Signed)
No neuro consult.

## 2014-05-20 NOTE — Anesthesia Preprocedure Evaluation (Addendum)
Anesthesia Evaluation  Patient identified by MRN, date of birth, ID band Patient awake    Reviewed: Allergy & Precautions, NPO status , Patient's Chart, lab work & pertinent test results  Airway Mallampati: II  TM Distance: >3 FB Neck ROM: Limited    Dental   Pulmonary neg pulmonary ROS,  breath sounds clear to auscultation        Cardiovascular Rhythm:Regular Rate:Tachycardia     Neuro/Psych    GI/Hepatic   Endo/Other    Renal/GU      Musculoskeletal   Abdominal   Peds  Hematology   Anesthesia Other Findings   Reproductive/Obstetrics                            Anesthesia Physical Anesthesia Plan  ASA: II  Anesthesia Plan: General   Post-op Pain Management:    Induction: Intravenous  Airway Management Planned: Oral ETT  Additional Equipment:   Intra-op Plan:   Post-operative Plan: Extubation in OR  Informed Consent: I have reviewed the patients History and Physical, chart, labs and discussed the procedure including the risks, benefits and alternatives for the proposed anesthesia with the patient or authorized representative who has indicated his/her understanding and acceptance.   Dental advisory given  Plan Discussed with: Anesthesiologist and Surgeon  Anesthesia Plan Comments:         Anesthesia Quick Evaluation

## 2014-05-20 NOTE — Progress Notes (Signed)
Lunch relief by MA Delicia Berens RN 
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
## 2014-05-20 NOTE — ED Notes (Signed)
Ortho at BS

## 2014-05-20 NOTE — Anesthesia Postprocedure Evaluation (Signed)
  Anesthesia Post-op Note  Patient: Tracy Foley  Procedure(s) Performed: Procedure(s): INTRAMEDULLARY (IM) RETROGRADE FEMORAL NAILING (Bilateral)  Patient Location: PACU  Anesthesia Type:General  Level of Consciousness: awake, alert  and oriented  Airway and Oxygen Therapy: Patient Spontanous Breathing and Patient connected to nasal cannula oxygen  Post-op Pain: mild  Post-op Assessment: Post-op Vital signs reviewed, Patient's Cardiovascular Status Stable, Respiratory Function Stable, Patent Airway and Pain level controlled  Post-op Vital Signs: stable  Last Vitals:  Filed Vitals:   05/20/14 1441  BP: 100/73  Pulse: 100  Temp: 37.9 C  Resp: 22    Complications: No apparent anesthesia complications
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
## 2014-05-20 NOTE — Progress Notes (Signed)
Pt has not voided since foley removal @ 1330 pt denies urge to void pt bladder scanned  And showed no more than 360 cc of urine per protocol will monitor and recheck in 2 hours

## 2014-05-21 ENCOUNTER — Inpatient Hospital Stay (HOSPITAL_COMMUNITY): Payer: Medicaid Other

## 2014-05-21 ENCOUNTER — Encounter (HOSPITAL_COMMUNITY): Payer: Self-pay | Admitting: Radiology

## 2014-05-21 DIAGNOSIS — D62 Acute posthemorrhagic anemia: Secondary | ICD-10-CM

## 2014-05-21 HISTORY — DX: Acute posthemorrhagic anemia: D62

## 2014-05-21 LAB — CBC WITH DIFFERENTIAL/PLATELET
BASOS PCT: 0 % (ref 0–1)
Basophils Absolute: 0 10*3/uL (ref 0.0–0.1)
EOS ABS: 0.1 10*3/uL (ref 0.0–0.7)
EOS PCT: 1 % (ref 0–5)
HEMATOCRIT: 27.4 % — AB (ref 36.0–46.0)
HEMOGLOBIN: 9.5 g/dL — AB (ref 12.0–15.0)
Lymphocytes Relative: 12 % (ref 12–46)
Lymphs Abs: 1.3 10*3/uL (ref 0.7–4.0)
MCH: 29 pg (ref 26.0–34.0)
MCHC: 34.7 g/dL (ref 30.0–36.0)
MCV: 83.5 fL (ref 78.0–100.0)
MONOS PCT: 7 % (ref 3–12)
Monocytes Absolute: 0.8 10*3/uL (ref 0.1–1.0)
NEUTROS PCT: 80 % — AB (ref 43–77)
Neutro Abs: 9 10*3/uL — ABNORMAL HIGH (ref 1.7–7.7)
PLATELETS: 120 10*3/uL — AB (ref 150–400)
RBC: 3.28 MIL/uL — ABNORMAL LOW (ref 3.87–5.11)
RDW: 12.3 % (ref 11.5–15.5)
WBC: 11.2 10*3/uL — ABNORMAL HIGH (ref 4.0–10.5)

## 2014-05-21 LAB — CBC
HEMATOCRIT: 20.2 % — AB (ref 36.0–46.0)
HEMATOCRIT: 20.8 % — AB (ref 36.0–46.0)
HEMOGLOBIN: 6.9 g/dL — AB (ref 12.0–15.0)
HEMOGLOBIN: 7 g/dL — AB (ref 12.0–15.0)
MCH: 28.7 pg (ref 26.0–34.0)
MCH: 29.2 pg (ref 26.0–34.0)
MCHC: 33.7 g/dL (ref 30.0–36.0)
MCHC: 34.2 g/dL (ref 30.0–36.0)
MCV: 85.6 fL (ref 78.0–100.0)
MCV: 85.2 fL (ref 78.0–100.0)
Platelets: 103 10*3/uL — ABNORMAL LOW (ref 150–400)
Platelets: 106 10*3/uL — ABNORMAL LOW (ref 150–400)
RBC: 2.36 MIL/uL — AB (ref 3.87–5.11)
RBC: 2.44 MIL/uL — AB (ref 3.87–5.11)
RDW: 11.7 % (ref 11.5–15.5)
RDW: 11.6 % (ref 11.5–15.5)
WBC: 8.7 10*3/uL (ref 4.0–10.5)
WBC: 7.8 10*3/uL (ref 4.0–10.5)

## 2014-05-21 LAB — PREPARE RBC (CROSSMATCH)

## 2014-05-21 MED ORDER — SODIUM CHLORIDE 0.9 % IV SOLN: Freq: Once | INTRAVENOUS | Status: DC

## 2014-05-21 MED ORDER — IOHEXOL 350 MG/ML SOLN
75.0000 mL | Freq: Once | INTRAVENOUS | Status: AC | PRN
  Administered 2014-05-21: 75 mL via INTRAVENOUS

## 2014-05-21 MED ORDER — CHLORHEXIDINE GLUCONATE 0.12 % MT SOLN
15.0000 mL | Freq: Two times a day (BID) | OROMUCOSAL | Status: DC
  Administered 2014-05-21 – 2014-05-22 (×3): 15 mL via OROMUCOSAL
  Filled 2014-05-21 (×6): qty 15

## 2014-05-21 MED ORDER — CETYLPYRIDINIUM CHLORIDE 0.05 % MT LIQD
7.0000 mL | Freq: Two times a day (BID) | OROMUCOSAL | Status: DC
  Administered 2014-05-21 – 2014-05-22 (×3): 7 mL via OROMUCOSAL

## 2014-05-21 NOTE — Progress Notes (Signed)
PT Cancellation Note  Patient Details Name: Theophilus BonesLena Harbeson MRN: [id number] DOB: August 30, 1995   Cancelled Treatment:    Reason Eval/Treat Not Completed: Medical issues which prohibited therapy.  Pt's resting HR tachy up to the 150's.  Will see 5/18 as able. 05/21/2014  [place] BingKen Gentle Hoge, PT [phone]  (pager)    Exie Chrismer, Eliseo GumKenneth V 05/21/2014, 5:39 PM

## 2014-05-21 NOTE — Progress Notes (Signed)
OT Cancellation Note  Patient Details Name: Theophilus BonesLena Police MRN: [id number] DOB: 1995/06/08   Cancelled Treatment:    Reason Eval/Treat Not Completed: Patient not medically ready HR in 140s. Nsg plans to give pt blood. Will attempt to see later this pm if appropriate.  [facility] Washington[facility],HILLARY  Chloe Flis, OTR/L  [phone] 05/21/2014 05/21/2014, 10:52 AM

## 2014-05-21 NOTE — Progress Notes (Signed)
Pt has not voided since removal of foley bladder scan revealed highest amount of 427 per protocol will recheck at 0200 pt denies discomfort or need to void

## 2014-05-21 NOTE — Progress Notes (Signed)
Made MD aware of pt's HR of 130-140's ST.  Chest Xray ordered. Will continue to monitor.
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
## 2014-05-21 NOTE — Progress Notes (Signed)
Lab tried twice  to get blood from patient, 3rd person came pt refused stick attempted to educate will continue to monitor.
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
## 2014-05-21 NOTE — Progress Notes (Signed)
Lab called with critical HGB of 6.9 which is a 3.5  Drop  Called for a stat redraw  Labs obtained awainting results

## 2014-05-21 NOTE — Progress Notes (Signed)
Pt had not voided since 1330 removal of foley, bladder scan showed 470, I$O cath performed per protocol 450 of yellow urine returned will continue to monitor.

## 2014-05-21 NOTE — Progress Notes (Signed)
Physician notified: Charma IgoMichael Jeffery, PA At: [phone]  Regarding: Pt Hbg dropped 7.0 this AM. Night shift stated pt had small amount coffee ground emesis. No obvious bleeding from surgical sites.  Awaiting return response.   Returned Response at: 0748  Order(s): Will address during rounding.

## 2014-05-21 NOTE — Progress Notes (Signed)
   Subjective:  Patient reports pain as moderate.    Objective:   VITALS:   Filed Vitals:   05/21/14 0800 05/21/14 1043 05/21/14 1124 05/21/14 1303  BP: 99/57 104/58 95/55 97/61   Pulse: 138 147 160 143  Temp: 98.6 F (37 C) 101.1 F (38.4 C) 99.5 F (37.5 C) 99.9 F (37.7 C)  TempSrc: Oral Oral Oral Oral  Resp: 20 23 28 23   Height:      Weight:      SpO2: 97% 91% 95% 93%    Neurologically intact Neurovascular intact Sensation intact distally Intact pulses distally Dorsiflexion/Plantar flexion intact Incision: dressing C/D/I and no drainage No cellulitis present Compartment soft   Lab Results  Component Value Date   WBC 7.8 05/21/2014   HGB 7.0* 05/21/2014   HCT 20.8* 05/21/2014   MCV 85.2 05/21/2014   PLT 103* 05/21/2014     Assessment/Plan:  1 Day Post-Op   - Expected postop acute blood loss anemia - will monitor for symptoms - Up with PT/OT - DVT ppx - SCDs, ambulation, lovenox - WBAT bilateral lower extremity - Pain control - Discharge planning  Cheral AlmasXu, Natividad Schlosser Michael 05/21/2014, 3:05 PM [phone]

## 2014-05-21 NOTE — Progress Notes (Signed)
Patient ID: Tracy BonesLena Foley, female   DOB: 09/26/1995, 19 y.o.   MRN: [id number] She appears comfortable but when I can get her to speak she states it hurts with deep breath. She is tachypneic and tachycardic.  This may very well from contusions but due to small right ptx and possibilty of vte.  I will get ct tonight.

## 2014-05-22 ENCOUNTER — Encounter (HOSPITAL_COMMUNITY): Payer: Self-pay | Admitting: Orthopaedic Surgery

## 2014-05-22 LAB — TYPE AND SCREEN
ABO/RH(D): O POS
Antibody Screen: NEGATIVE
UNIT DIVISION: 0
UNIT DIVISION: 0
Unit division: 0

## 2014-05-22 LAB — CBC WITH DIFFERENTIAL/PLATELET
Basophils Absolute: 0 10*3/uL (ref 0.0–0.1)
Basophils Relative: 0 % (ref 0–1)
Eosinophils Absolute: 0.1 10*3/uL (ref 0.0–0.7)
Eosinophils Relative: 1 % (ref 0–5)
HCT: 24.2 % — ABNORMAL LOW (ref 36.0–46.0)
HEMOGLOBIN: 8.5 g/dL — AB (ref 12.0–15.0)
LYMPHS ABS: 1.2 10*3/uL (ref 0.7–4.0)
LYMPHS PCT: 11 % — AB (ref 12–46)
MCH: 29 pg (ref 26.0–34.0)
MCHC: 35.1 g/dL (ref 30.0–36.0)
MCV: 82.6 fL (ref 78.0–100.0)
MONOS PCT: 7 % (ref 3–12)
Monocytes Absolute: 0.8 10*3/uL (ref 0.1–1.0)
NEUTROS PCT: 80 % — AB (ref 43–77)
Neutro Abs: 8.4 10*3/uL — ABNORMAL HIGH (ref 1.7–7.7)
Platelets: 113 10*3/uL — ABNORMAL LOW (ref 150–400)
RBC: 2.93 MIL/uL — ABNORMAL LOW (ref 3.87–5.11)
RDW: 12.7 % (ref 11.5–15.5)
WBC: 10.4 10*3/uL (ref 4.0–10.5)

## 2014-05-22 MED ORDER — FUROSEMIDE 10 MG/ML IJ SOLN
20.0000 mg | Freq: Once | INTRAMUSCULAR | Status: AC
  Administered 2014-05-22: 20 mg via INTRAVENOUS
  Filled 2014-05-22: qty 2

## 2014-05-22 NOTE — Progress Notes (Signed)
CSW Intern Shane CrutchJenyea McLean's assessment note reviewed.  CSW agrees with its content.
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
## 2014-05-22 NOTE — Clinical Social Work Note (Cosign Needed)
Clinical Social Work Assessment  Patient Details  Name: Tracy Foley MRN: [id number] Date of Birth: [birth year]  Date of referral:  05/22/14               Reason for consult:  Rule-out Psychosocial                Permission sought to share information with:    Permission granted to share information::     Name::        Agency::     Relationship::     Contact Information:     Housing/Transportation Living arrangements for the past 2 months:  Apartment Source of Information:  Patient Patient Interpreter Needed:  None Criminal Activity/Legal Involvement Pertinent to Current Situation/Hospitalization:  No - Comment as needed Significant Relationships:  Siblings Emergency planning/management officer(Mark Digeronimo) Lives with:  Siblings Do you feel safe going back to the place where you live?  Yes Need for family participation in patient care:  Yes (Comment)  Care giving concerns:  Patient will discharge to CIR.    Social Worker assessment / plan:  CSW intern discussed with patient plan for discharge. Patient states she lives with her brother and sister-in-law in an apartment. Patient states sister-in-law is available for 24 hours support if needed. Patient is in agreement with going to CIR.  Employment status:  Unemployed Health and safety inspectornsurance information:  Self Pay (Medicaid Pending) PT Recommendations:  Inpatient Rehab Consult Information / Referral to community resources:  SBIRT  Patient/Family's Response to care:  Patient alert and oriented x4, in bed watching television. Patient very quiet and soft-spoken.   Patient/Family's Understanding of and Emotional Response to Diagnosis, Current Treatment, and Prognosis:  No family currently at bedside, but patient stated family does visit her regularly. Patient's sister-in-law is available for 24 hour support in the home, if needed. Patient understanding of Social Worker's role and appreciative of support.   Emotional Assessment Appearance:  Appears stated  age Attitude/Demeanor/Rapport:   (Quiet and soft-spoken) Affect (typically observed):  Sad, Quiet Orientation:  Oriented to Self, Oriented to Place, Oriented to  Time, Oriented to Situation Alcohol / Substance use:  Never Used Psych involvement (Current and /or in the community):  No (Comment)  Discharge Needs  Concerns to be addressed:  No discharge needs identified Readmission within the last 30 days:  No Current discharge risk:  None Barriers to Discharge:  No Barriers Identified   Dominic PeaMcLean, Maribella Kuna G, Student-SW 05/22/2014, 10:16 PM

## 2014-05-22 NOTE — Progress Notes (Signed)
SBIRT assessment completed. Patient denies alcohol use. No further intervention needed.  
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
## 2014-05-22 NOTE — Progress Notes (Signed)
Rehab Admissions Coordinator Note:  Patient was screened by [name], [name] for appropriateness for an Inpatient Acute Rehab Consult per OT recommendation. At this time, we are recommending Inpatient Rehab consult. I will contact Trauma PA for order.  [name], [name] 05/22/2014, 4:18 PM  I can be reached at [phone].

## 2014-05-23 ENCOUNTER — Inpatient Hospital Stay (HOSPITAL_COMMUNITY): Payer: Medicaid Other

## 2014-05-23 DIAGNOSIS — S7291XS Unspecified fracture of right femur, sequela: Secondary | ICD-10-CM

## 2014-05-23 DIAGNOSIS — S060X1S Concussion with loss of consciousness of 30 minutes or less, sequela: Secondary | ICD-10-CM

## 2014-05-23 DIAGNOSIS — S7292XS Unspecified fracture of left femur, sequela: Secondary | ICD-10-CM

## 2014-05-23 LAB — CBC WITH DIFFERENTIAL/PLATELET
Basophils Absolute: 0 10*3/uL (ref 0.0–0.1)
Basophils Relative: 0 % (ref 0–1)
Eosinophils Absolute: 0.3 10*3/uL (ref 0.0–0.7)
Eosinophils Relative: 3 % (ref 0–5)
HCT: 24.4 % — ABNORMAL LOW (ref 36.0–46.0)
Hemoglobin: 8.3 g/dL — ABNORMAL LOW (ref 12.0–15.0)
Lymphocytes Relative: 13 % (ref 12–46)
Lymphs Abs: 1.2 10*3/uL (ref 0.7–4.0)
MCH: 28.3 pg (ref 26.0–34.0)
MCHC: 34 g/dL (ref 30.0–36.0)
MCV: 83.3 fL (ref 78.0–100.0)
Monocytes Absolute: 0.6 10*3/uL (ref 0.1–1.0)
Monocytes Relative: 7 % (ref 3–12)
Neutro Abs: 7.1 10*3/uL (ref 1.7–7.7)
Neutrophils Relative %: 77 % (ref 43–77)
Platelets: 154 10*3/uL (ref 150–400)
RBC: 2.93 MIL/uL — ABNORMAL LOW (ref 3.87–5.11)
RDW: 12.7 % (ref 11.5–15.5)
WBC: 9.2 10*3/uL (ref 4.0–10.5)

## 2014-05-23 LAB — BASIC METABOLIC PANEL
Anion gap: 7 (ref 5–15)
CHLORIDE: 103 mmol/L (ref 101–111)
CO2: 26 mmol/L (ref 22–32)
CREATININE: 0.45 mg/dL (ref 0.44–1.00)
Calcium: 7.9 mg/dL — ABNORMAL LOW (ref 8.9–10.3)
GFR calc non Af Amer: >60 mL/min (ref >60)
GLUCOSE: 97 mg/dL (ref 65–99)
Potassium: 3.5 mmol/L (ref 3.5–5.1)
Sodium: 136 mmol/L (ref 135–145)
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
## 2014-05-23 NOTE — Care Management Note (Signed)
Case Management Note  Patient Details  Name: Theophilus BonesLena Barman MRN: [id number] Date of Birth: [birth year]  Subjective/Objective:                    Action/Plan:  PT recommending CIR; noted poss dc to inpt rehab on 5/20.  Will follow progress.     Expected Discharge Date:                  Expected Discharge Plan:  IP Rehab Facility  In-House Referral:  Clinical Social Work  Discharge planning Services     Post Acute Care Choice:    Choice offered to:     DME Arranged:    DME Agency:     HH Arranged:    HH Agency:     Status of Service:  In process, will continue to follow  Medicare Important Message Given:  No Date Medicare IM Given:    Medicare IM give by:    Date Additional Medicare IM Given:    Additional Medicare Important Message give by:     If discussed at Long Length of Stay Meetings, dates discussed:    Additional Comments:  Glennon Macmerson, Caylie Sandquist M, RN 05/23/2014, 3:52 PM Phone #[phone]

## 2014-05-23 NOTE — Progress Notes (Signed)
I was contacted by pt's brother and sister in law. They prefer pt to receive inpt rehab prior to d/c home. Pt had spoken to them about our previous discussion. Noted pt ambulated well with therapy this afternoon. I will follow up tomorrow to possibly admit to inpt rehab tomorrow. [phone]

## 2014-05-23 NOTE — Progress Notes (Signed)
   Subjective:  Patient does not move around much.  CT scan - atelectasis, possibly pneumonia.  Objective:   VITALS:   Filed Vitals:   05/22/14 1935 05/22/14 2345 05/22/14 2350 05/23/14 0335  BP: 101/47  99/59 105/63  Pulse: 135  126 117  Temp: 98.4 F (36.9 C) 98.4 F (36.9 C)  98.2 F (36.8 C)  TempSrc: Oral Oral  Oral  Resp: 31  23 28   Height:      Weight:      SpO2: 98%  98% 100%    Incisions c/d/i Compartments soft   Lab Results  Component Value Date   WBC 9.2 05/23/2014   HGB 8.3* 05/23/2014   HCT 24.4* 05/23/2014   MCV 83.3 05/23/2014   PLT 154 05/23/2014     Assessment/Plan:  3 Days Post-Op   - WBAT BLE - Hgb stable - encouraged patient to be more mobile - patient has no motivation to get out of bed  Cheral AlmasXu, Naiping Michael 05/23/2014, 7:39 AM [phone]
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
## 2014-05-23 NOTE — Progress Notes (Signed)
Pt to TX to 5N-30, called report. Family present and aware of TX.

## 2014-05-23 NOTE — Progress Notes (Signed)
I met with pt at bedside. I discussed  A possible inpt rehab admission depending on her increased participation with therapy. Other rehab options would be home with [facility] and family or short term SNF. She has given me permission to contact her brother and discuss rehab. I will do today and alert team to plan for rehab. [phone]

## 2014-05-23 NOTE — Progress Notes (Signed)
[place] received from [place], [place] 5N30
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
## 2014-05-24 ENCOUNTER — Encounter (HOSPITAL_COMMUNITY): Payer: Self-pay | Admitting: Emergency Medicine

## 2014-05-24 ENCOUNTER — Inpatient Hospital Stay (HOSPITAL_COMMUNITY)
Admission: RE | Admit: 2014-05-24 | Discharge: 2014-05-31 | DRG: 560 | Disposition: A | Discharge status: Home Health | Payer: Medicaid Other | Source: Other Acute Inpatient Hospital | Attending: Physical Medicine & Rehabilitation | Admitting: Physical Medicine & Rehabilitation

## 2014-05-24 DIAGNOSIS — S062X1A Diffuse traumatic brain injury with loss of consciousness of 30 minutes or less, initial encounter: Secondary | ICD-10-CM | POA: Diagnosis present

## 2014-05-24 DIAGNOSIS — B9620 Unspecified Escherichia coli [E. coli] as the cause of diseases classified elsewhere: Secondary | ICD-10-CM | POA: Diagnosis not present

## 2014-05-24 DIAGNOSIS — S72301S Unspecified fracture of shaft of right femur, sequela: Secondary | ICD-10-CM

## 2014-05-24 DIAGNOSIS — K5900 Constipation, unspecified: Secondary | ICD-10-CM | POA: Diagnosis present

## 2014-05-24 DIAGNOSIS — S72401D Unspecified fracture of lower end of right femur, subsequent encounter for closed fracture with routine healing: Secondary | ICD-10-CM | POA: Diagnosis not present

## 2014-05-24 DIAGNOSIS — S72302S Unspecified fracture of shaft of left femur, sequela: Secondary | ICD-10-CM

## 2014-05-24 DIAGNOSIS — N390 Urinary tract infection, site not specified: Secondary | ICD-10-CM | POA: Diagnosis not present

## 2014-05-24 DIAGNOSIS — S069X9D Unspecified intracranial injury with loss of consciousness of unspecified duration, subsequent encounter: Secondary | ICD-10-CM | POA: Diagnosis not present

## 2014-05-24 DIAGNOSIS — S72402D Unspecified fracture of lower end of left femur, subsequent encounter for closed fracture with routine healing: Principal | ICD-10-CM

## 2014-05-24 DIAGNOSIS — S7292XA Unspecified fracture of left femur, initial encounter for closed fracture: Secondary | ICD-10-CM

## 2014-05-24 DIAGNOSIS — S069X1S Unspecified intracranial injury with loss of consciousness of 30 minutes or less, sequela: Secondary | ICD-10-CM

## 2014-05-24 DIAGNOSIS — D62 Acute posthemorrhagic anemia: Secondary | ICD-10-CM | POA: Diagnosis present

## 2014-05-24 DIAGNOSIS — S062X1D Diffuse traumatic brain injury with loss of consciousness of 30 minutes or less, subsequent encounter: Secondary | ICD-10-CM | POA: Diagnosis not present

## 2014-05-24 DIAGNOSIS — S7292XS Unspecified fracture of left femur, sequela: Secondary | ICD-10-CM | POA: Diagnosis not present

## 2014-05-24 DIAGNOSIS — S062X1S Diffuse traumatic brain injury with loss of consciousness of 30 minutes or less, sequela: Secondary | ICD-10-CM | POA: Diagnosis not present

## 2014-05-24 DIAGNOSIS — S7291XA Unspecified fracture of right femur, initial encounter for closed fracture: Secondary | ICD-10-CM | POA: Diagnosis present

## 2014-05-24 DIAGNOSIS — S7291XF Unspecified fracture of right femur, subsequent encounter for open fracture type IIIA, IIIB, or IIIC with routine healing: Secondary | ICD-10-CM | POA: Diagnosis not present

## 2014-05-24 DIAGNOSIS — S7291XS Unspecified fracture of right femur, sequela: Secondary | ICD-10-CM | POA: Diagnosis not present

## 2014-05-24 DIAGNOSIS — S7292XF Unspecified fracture of left femur, subsequent encounter for open fracture type IIIA, IIIB, or IIIC with routine healing: Secondary | ICD-10-CM | POA: Diagnosis not present

## 2014-05-24 DIAGNOSIS — M7989 Other specified soft tissue disorders: Secondary | ICD-10-CM | POA: Diagnosis not present

## 2014-05-24 HISTORY — DX: Diffuse traumatic brain injury with loss of consciousness of 30 minutes or less, initial encounter: S06.2X1A

## 2014-05-24 LAB — CREATININE, SERUM
CREATININE: 0.49 mg/dL (ref 0.44–1.00)
GFR calc non Af Amer: >60 mL/min (ref >60)

## 2014-05-24 LAB — CBC
HEMATOCRIT: 22.3 % — AB (ref 36.0–46.0)
HEMOGLOBIN: 7.6 g/dL — AB (ref 12.0–15.0)
MCH: 29.3 pg (ref 26.0–34.0)
MCHC: 34.1 g/dL (ref 30.0–36.0)
MCV: 86.1 fL (ref 78.0–100.0)
Platelets: 281 10*3/uL (ref 150–400)
RBC: 2.59 MIL/uL — ABNORMAL LOW (ref 3.87–5.11)
RDW: 12.9 % (ref 11.5–15.5)
WBC: 8.3 10*3/uL (ref 4.0–10.5)

## 2014-05-24 LAB — BLOOD PRODUCT ORDER (VERBAL) VERIFICATION

## 2014-05-24 MED ORDER — METHOCARBAMOL 500 MG PO TABS
500.0000 mg | ORAL_TABLET | Freq: Four times a day (QID) | ORAL | Status: DC | PRN
  Administered 2014-05-24 – 2014-05-29 (×5): 500 mg via ORAL
  Filled 2014-05-24 (×5): qty 1

## 2014-05-24 MED ORDER — POLYETHYLENE GLYCOL 3350 17 G PO PACK
17.0000 g | PACK | Freq: Every day | ORAL | Status: DC
  Administered 2014-05-25 – 2014-05-26 (×2): 17 g via ORAL
  Filled 2014-05-24 (×8): qty 1

## 2014-05-24 MED ORDER — HYDROCODONE-ACETAMINOPHEN 10-325 MG PO TABS
0.5000 | ORAL_TABLET | ORAL | Status: DC | PRN
  Administered 2014-05-24: 2 via ORAL
  Administered 2014-05-25: 1 via ORAL
  Administered 2014-05-25: 2 via ORAL
  Administered 2014-05-26 – 2014-05-27 (×7): 1 via ORAL
  Administered 2014-05-28: 2 via ORAL
  Administered 2014-05-28: 1 via ORAL
  Administered 2014-05-28 – 2014-05-29 (×4): 2 via ORAL
  Administered 2014-05-29: 1 via ORAL
  Administered 2014-05-30 – 2014-05-31 (×4): 2 via ORAL
  Filled 2014-05-24 (×3): qty 1
  Filled 2014-05-24: qty 2
  Filled 2014-05-24: qty 1
  Filled 2014-05-24 (×9): qty 2
  Filled 2014-05-24: qty 1
  Filled 2014-05-24: qty 2
  Filled 2014-05-24: qty 1
  Filled 2014-05-24: qty 2
  Filled 2014-05-24: qty 1
  Filled 2014-05-24: qty 2
  Filled 2014-05-24: qty 1

## 2014-05-24 MED ORDER — SORBITOL 70 % SOLN: 30.0000 mL | Freq: Every day | Status: DC | PRN

## 2014-05-24 MED ORDER — ONDANSETRON HCL 4 MG PO TABS: 4.0000 mg | ORAL_TABLET | Freq: Four times a day (QID) | ORAL | Status: DC | PRN

## 2014-05-24 MED ORDER — ONDANSETRON HCL 4 MG/2ML IJ SOLN: 4.0000 mg | Freq: Four times a day (QID) | INTRAMUSCULAR | Status: DC | PRN

## 2014-05-24 MED ORDER — ENOXAPARIN SODIUM 30 MG/0.3ML ~~LOC~~ SOLN
30.0000 mg | SUBCUTANEOUS | Status: DC
  Administered 2014-05-25 – 2014-05-29 (×5): 30 mg via SUBCUTANEOUS
  Filled 2014-05-24 (×8): qty 0.3

## 2014-05-24 MED ORDER — ENOXAPARIN SODIUM 30 MG/0.3ML ~~LOC~~ SOLN
30.0000 mg | SUBCUTANEOUS | Status: DC
  Administered 2014-05-25: 30 mg via SUBCUTANEOUS
  Filled 2014-05-24 (×3): qty 0.3

## 2014-05-24 MED ORDER — ACETAMINOPHEN 650 MG RE SUPP: 650.0000 mg | Freq: Four times a day (QID) | RECTAL | Status: DC | PRN

## 2014-05-24 MED ORDER — ACETAMINOPHEN 325 MG PO TABS: 650.0000 mg | ORAL_TABLET | Freq: Four times a day (QID) | ORAL | Status: DC | PRN

## 2014-05-24 MED ORDER — DOCUSATE SODIUM 100 MG PO CAPS
100.0000 mg | ORAL_CAPSULE | Freq: Two times a day (BID) | ORAL | Status: DC
  Administered 2014-05-24 – 2014-05-31 (×13): 100 mg via ORAL
  Filled 2014-05-24 (×16): qty 1

## 2014-05-24 NOTE — Progress Notes (Signed)
OT Cancellation Note  Patient Details Name: Tracy Foley MRN: [id number] DOB: [birth year]   Cancelled Treatment:    Reason Eval/Treat Not Completed: Other (comment)attempted skilled OT this am and pt. Politely declined stating "i just dont feel like it right now".  Requested i try to come back later.  Explained we would try to come back if time allowed.  Reviewed importance of getting oob, verbalized understanding but still declined at this time.  Robet LeuMorris, Verlee Pope Lorraine, COTA/L 05/24/2014, 10:12 AM
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
## 2014-05-24 NOTE — Progress Notes (Signed)
I met with pt at bedside alone and discussed events of overnight. She is in agreement to admission to inpt rehab today. I will contact Security to clarify issues surrounding XXX status. Pt has given me permission to again contact her brother and sister in law to discuss her admission to inpt rehab today. I will make the arrangements. I will contact Trauma PA, RN CM and SW. [phone]
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
## 2014-05-24 NOTE — Progress Notes (Signed)
Pt admitted to Rehab from [facility]. Alert and orientated x 4. Orientated to Rehab therapy and unit expectation. Diagnostic specific information provided. Safety concerns discussed. Pt to begin therapy in the a.m.
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
## 2014-05-24 NOTE — Progress Notes (Signed)
I contacted Merchant navy officer and Security, Ernie. He arrived to unit and met separately with pt to discuss XXX safety issues. I will discuss with rehab staff prior to admission to [facility] rehab. [phone]

## 2014-05-25 ENCOUNTER — Inpatient Hospital Stay (HOSPITAL_COMMUNITY): Payer: Medicaid Other | Admitting: Speech Pathology

## 2014-05-25 ENCOUNTER — Inpatient Hospital Stay (HOSPITAL_COMMUNITY): Payer: Medicaid Other

## 2014-05-25 ENCOUNTER — Inpatient Hospital Stay (HOSPITAL_COMMUNITY): Payer: Medicaid Other | Admitting: Physical Therapy

## 2014-05-25 DIAGNOSIS — S062X1D Diffuse traumatic brain injury with loss of consciousness of 30 minutes or less, subsequent encounter: Secondary | ICD-10-CM

## 2014-05-25 DIAGNOSIS — S7292XF Unspecified fracture of left femur, subsequent encounter for open fracture type IIIA, IIIB, or IIIC with routine healing: Secondary | ICD-10-CM

## 2014-05-25 DIAGNOSIS — S7291XF Unspecified fracture of right femur, subsequent encounter for open fracture type IIIA, IIIB, or IIIC with routine healing: Secondary | ICD-10-CM

## 2014-05-25 LAB — URINALYSIS, ROUTINE W REFLEX MICROSCOPIC
BILIRUBIN URINE: NEGATIVE
Bilirubin Urine: NEGATIVE
GLUCOSE, UA: NEGATIVE mg/dL
Glucose, UA: NEGATIVE mg/dL
HGB URINE DIPSTICK: NEGATIVE
Hgb urine dipstick: NEGATIVE
KETONES UR: NEGATIVE mg/dL
Ketones, ur: NEGATIVE mg/dL
Nitrite: POSITIVE — AB
Nitrite: NEGATIVE
PROTEIN: NEGATIVE mg/dL
PROTEIN: NEGATIVE mg/dL
Specific Gravity, Urine: 1.007 (ref 1.005–1.030)
Specific Gravity, Urine: 1.014 (ref 1.005–1.030)
UROBILINOGEN UA: 1 mg/dL (ref 0.0–1.0)
UROBILINOGEN UA: 1 mg/dL (ref 0.0–1.0)
pH: 7.5 (ref 5.0–8.0)
pH: 6.5 (ref 5.0–8.0)

## 2014-05-25 LAB — URINE MICROSCOPIC-ADD ON
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
## 2014-05-26 ENCOUNTER — Encounter (HOSPITAL_COMMUNITY): Payer: Self-pay

## 2014-05-26 ENCOUNTER — Inpatient Hospital Stay (HOSPITAL_COMMUNITY): Payer: Medicaid Other | Admitting: *Deleted

## 2014-05-26 NOTE — Progress Notes (Signed)
Physical Therapy Session Note  Patient Details  Name: Tracy Foley XXXSamreth MRN: [id number] Date of Birth: 10-01-1995  Today's Date: 05/26/2014 PT Individual Time: 1000-1100 PT Individual Time Calculation (min): 60 min   Short Term Goals: Week 1:  PT Short Term Goal 1 (Week 1): Pt will transfer supine to edge of bed, edge of bed to supine with min A.  PT Short Term Goal 2 (Week 1): Pt will transfer bed to chair, chair to bed with min A.  PT Short Term Goal 3 (Week 1): Pt will ambulate with LRAD about 50 feet with min A.  PT Short Term Goal 4 (Week 1): Pt will propel w/c about 150 feet with S.  PT Short Term Goal 5 (Week 1): Pt will ascend/descend 1 step with no rails and assistive device with min A.   Skilled Therapeutic Interventions/Progress Updates:    Patient received sitting in recliner. Session focused on functional transfers, wheelchair mobility, and increasing standing tolerance. Patient on room air entire session with SpO2 99-100% with continuous monitoring with activity. Wheelchair mobility >150' x2 with B UEs and supervision, verbal cues for improved efficiency. Repeated sit<>stands with prolonged standing between x10", 15", 60", minA overall for anterior weight shift as patient with posterior lean and reliant on UEs during sit>stand.   Gait training 3932' x1 with RW and minA, +2 for w/c follow due to patient anxiety. Patient demonstrates step through pattern, decreased step/stride length, and trunk flexion. Patient requires verbal cues for increased weight bearing through B UEs to decreased B LE pain, upright posture, and increased step/stride length. Patient returned to room and left sitting in recliner with B LEs elevated and all needs within reach.  SpO2 remains 99-100% on room air, HR fluctuating 122-148 resting and with activity. Patient requires significantly increased time for all activities due to pain and anxiety, but does very well when given time. RN notified of elevated  HRs.  Therapy Documentation Precautions:  Precautions Precautions: Fall Restrictions Weight Bearing Restrictions: Yes RUE Weight Bearing: Weight bearing as tolerated RLE Weight Bearing: Weight bearing as tolerated LLE Weight Bearing: Weight bearing as tolerated Vital Signs: Therapy Vitals Pulse Rate: (!) 128 Patient Position (if appropriate): Sitting Oxygen Therapy SpO2: 100 % O2 Device: Not Delivered Pain: Pain Assessment Pain Assessment: 0-10 Pain Score: 5  Pain Type: Acute pain Pain Location: Leg Pain Orientation: Right;Left Pain Descriptors / Indicators: Aching;Sore;Stabbing;Sharp Pain Frequency: Constant Pain Onset: With Activity Pain Intervention(s): Repositioned;RN made aware;Ambulation/increased activity;Rest;Therapeutic touch Multiple Pain Sites: No Locomotion : Ambulation Ambulation/Gait Assistance: 4: Min assist Wheelchair Mobility Distance: 150   See FIM for current functional status  Therapy/Group: Individual Therapy  Chipper HerbBridget S Izaha Shughart S. Kenslie Abbruzzese, PT, DPT 05/26/2014, 12:07 PM
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
## 2014-05-27 ENCOUNTER — Inpatient Hospital Stay (HOSPITAL_COMMUNITY): Payer: Medicaid Other

## 2014-05-27 ENCOUNTER — Inpatient Hospital Stay (HOSPITAL_COMMUNITY): Payer: Medicaid Other | Admitting: Physical Therapy

## 2014-05-27 ENCOUNTER — Inpatient Hospital Stay (HOSPITAL_COMMUNITY): Payer: Medicaid Other | Admitting: Speech Pathology

## 2014-05-27 DIAGNOSIS — M7989 Other specified soft tissue disorders: Secondary | ICD-10-CM

## 2014-05-27 DIAGNOSIS — S7291XS Unspecified fracture of right femur, sequela: Secondary | ICD-10-CM

## 2014-05-27 DIAGNOSIS — S7292XS Unspecified fracture of left femur, sequela: Secondary | ICD-10-CM

## 2014-05-27 DIAGNOSIS — S062X1S Diffuse traumatic brain injury with loss of consciousness of 30 minutes or less, sequela: Secondary | ICD-10-CM

## 2014-05-27 LAB — COMPREHENSIVE METABOLIC PANEL
ALBUMIN: 2.8 g/dL — AB (ref 3.5–5.0)
ALK PHOS: 61 U/L (ref 38–126)
ALT: 46 U/L (ref 14–54)
AST: 46 U/L — ABNORMAL HIGH (ref 15–41)
Anion gap: 6 (ref 5–15)
BILIRUBIN TOTAL: 0.8 mg/dL (ref 0.3–1.2)
CHLORIDE: 102 mmol/L (ref 101–111)
CO2: 27 mmol/L (ref 22–32)
CREATININE: 0.45 mg/dL (ref 0.44–1.00)
Calcium: 8.7 mg/dL — ABNORMAL LOW (ref 8.9–10.3)
GFR calc Af Amer: >60 mL/min (ref >60)
GLUCOSE: 96 mg/dL (ref 65–99)
Potassium: 4.1 mmol/L (ref 3.5–5.1)
SODIUM: 135 mmol/L (ref 135–145)
Total Protein: 5.9 g/dL — ABNORMAL LOW (ref 6.5–8.1)

## 2014-05-27 LAB — URINE CULTURE: Special Requests: NORMAL

## 2014-05-27 LAB — CBC WITH DIFFERENTIAL/PLATELET
Basophils Absolute: 0.1 10*3/uL (ref 0.0–0.1)
Basophils Relative: 1 % (ref 0–1)
EOS PCT: 3 % (ref 0–5)
Eosinophils Absolute: 0.3 10*3/uL (ref 0.0–0.7)
HCT: 22.9 % — ABNORMAL LOW (ref 36.0–46.0)
HEMOGLOBIN: 7.2 g/dL — AB (ref 12.0–15.0)
LYMPHS PCT: 19 % (ref 12–46)
Lymphs Abs: 1.9 10*3/uL (ref 0.7–4.0)
MCH: 28.6 pg (ref 26.0–34.0)
MCHC: 31.4 g/dL (ref 30.0–36.0)
MCV: 90.9 fL (ref 78.0–100.0)
MONOS PCT: 7 % (ref 3–12)
Monocytes Absolute: 0.7 10*3/uL (ref 0.1–1.0)
Neutro Abs: 7.2 10*3/uL (ref 1.7–7.7)
Neutrophils Relative %: 70 % (ref 43–77)
Platelets: 459 10*3/uL — ABNORMAL HIGH (ref 150–400)
RBC: 2.52 MIL/uL — ABNORMAL LOW (ref 3.87–5.11)
RDW: 15.4 % (ref 11.5–15.5)
WBC: 10.2 10*3/uL (ref 4.0–10.5)

## 2014-05-27 MED ORDER — FERROUS SULFATE 325 (65 FE) MG PO TABS
325.0000 mg | ORAL_TABLET | Freq: Two times a day (BID) | ORAL | Status: DC
  Administered 2014-05-27 – 2014-05-31 (×9): 325 mg via ORAL
  Filled 2014-05-27 (×14): qty 1
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
## 2014-05-27 NOTE — Care Management Note (Signed)
[facility]patient [facility] Individual Statement of Services  Patient Name:  Tracy Foley  Date:  05/27/2014  Welcome to the Inpatient Rehabilitation [facility].  Our goal is to provide you with an individualized program based on your diagnosis and situation, designed to meet your specific needs.  With this comprehensive rehabilitation program, you will be expected to participate in at least 3 hours of rehabilitation therapies Monday-Friday, with modified therapy programming on the weekends.  Your rehabilitation program will include the following services:  Physical Therapy (PT), Occupational Therapy (OT), Speech Therapy (ST), 24 hour per day rehabilitation nursing, Therapeutic Recreaction (TR), Neuropsychology, Case Management (Social Worker), Rehabilitation Medicine, Nutrition Services and Pharmacy Services  Weekly team conferences will be held on Tuesdays to discuss your progress.  Your Social Worker will talk with you frequently to get your input and to update you on team discussions.  Team conferences with you and your family in attendance may also be held.  Expected length of stay: 7-10 days  Overall anticipated outcome: modified independent  Depending on your progress and recovery, your program may change. Your Social Worker will coordinate services and will keep you informed of any changes. Your Social Worker's name and contact numbers are listed  below.  The following services may also be recommended but are not provided by the Inpatient Rehabilitation Center:   Driving Evaluations  Home Health Rehabiltiation Services  Outpatient Rehabilitation Services  Vocational Rehabilitation   Arrangements will be made to provide these services after discharge if needed.  Arrangements include referral to agencies that provide these services.  Your insurance has been verified to be:  Medicaid Your primary doctor is:  None Merchant navy officer(Social Worker will assist with finding primary  physician)  Pertinent information will be shared with your doctor and your insurance company.  Social Worker:  [place], [place] [phone] or ([phone]) [phone]   Information discussed with and copy given to patient by: Amada JupiterHOYLE, Saniyyah Elster, 05/27/2014, 4:04 PM

## 2014-05-27 NOTE — Progress Notes (Signed)
Bilateral lower extremity venous duplex completed:  No evidence of DVT, superficial thrombosis, or Baker's cyst.   
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
## 2014-05-27 NOTE — Progress Notes (Signed)
Physical Therapy Session Note  Patient Details  Name: Tracy Foley MRN: [id number] Date of Birth: 24-May-1995  Today's Date: 05/27/2014 PT Individual Time: 1000-1100 PT Individual Time Calculation (min): 60 min   Short Term Goals: Week 1:  PT Short Term Goal 1 (Week 1): Pt will transfer supine to edge of bed, edge of bed to supine with min Foley.  PT Short Term Goal 2 (Week 1): Pt will transfer bed to chair, chair to bed with min Foley.  PT Short Term Goal 3 (Week 1): Pt will ambulate with LRAD about 50 feet with min Foley.  PT Short Term Goal 4 (Week 1): Pt will propel w/c about 150 feet with S.  PT Short Term Goal 5 (Week 1): Pt will ascend/descend 1 step with no rails and assistive device with min Foley.   Skilled Therapeutic Interventions/Progress Updates:   Session focused on functional mobility training and activity tolerance. Patient's sister present for first half of session. Patient transferred to edge of bed with min Foley for LLE and performed stand pivot transfer using RW with supervision. Patient propelled wheelchair room <> therapy gym with mod I and efficient technique with supervision cues for leg rest management. Seated HR at rest = 127. Gait using RW x 150 ft + 130 ft with supervision and increased time, increased dependence on BUE with elbows locked out and min verbal cues for upright posture and forward gaze. Patient reported feeling like she was going to "black out," seated BP 112/65. Anticipate patient also with increased anxiety due to visualizing staples on B knees, encouraged patient to wear pants instead of shorts due to poor tolerance for seeing wounds/staples. Performed car transfer to sedan height using RW with supervision and extra time, patient picking up/lowering each LE in/out of car with BUE. Stair training up eight 3" steps and down four 6" steps using 2 rails with supervision and min verbal cues for step-to sequencing leading with RLE ascending and LLE descending due to  increased L > R knee pain. Patient with increased pain after stairs and began to cry. Discussed with RN switching patient from PRN to scheduled medication for improved pain management to allow increased participation in therapeutic tasks. Patient requesting to return to bed, required mod Foley to lift BLE onto bed. Patient left semi reclined in bed with bed alarm on and call bell within reach, CSW in room. Heart rate monitored throughout session between 127-160 and Sp02 >97% on room air.   Therapy Documentation Precautions:  Precautions Precautions: Fall Restrictions Weight Bearing Restrictions: Yes RUE Weight Bearing: Weight bearing as tolerated RLE Weight Bearing: Weight bearing as tolerated LLE Weight Bearing: Weight bearing as tolerated Vital Signs: Therapy Vitals Pulse Rate: (!) 160 BP: 112/65 mmHg Patient Position (if appropriate): Sitting (after ambulation) Oxygen Therapy SpO2: 97 % O2 Device: Not Delivered Pain: Pain Assessment Pain Assessment: 0-10 Pain Score: 6  Pain Type: Acute pain Pain Location: Knee Pain Orientation: Right;Left Pain Descriptors / Indicators: Aching Pain Frequency: Constant Pain Onset: On-going Patients Stated Pain Goal: 2 Pain Intervention(s): Repositioned;Ambulation/increased activity Locomotion : Ambulation Ambulation/Gait Assistance: 5: Supervision   See FIM for current functional status  Therapy/Group: Individual Therapy  Kerney ElbeVarner, Tracy Foley 05/27/2014, 12:29 PM

## 2014-05-27 NOTE — Progress Notes (Signed)
Patient information reviewed and entered into eRehab system by Victoriya Pol, RN, CRRN, PPS Coordinator.  Information including medical coding and functional independence measure will be reviewed and updated through discharge.    
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
## 2014-05-27 NOTE — Progress Notes (Signed)
Occupational Therapy Session Note  Patient Details  Name: Nori RiisChandanee XXXSamreth MRN: [id number] Date of Birth: 05-19-1995  Today's Date: 05/27/2014 OT Individual Time: 0700-0800 and 1300-1355 OT Individual Time Calculation (min): 60 min and 55 min     Short Term Goals: Week 1:  OT Short Term Goal 1 (Week 1): STG=LTG d/t short LOS  Skilled Therapeutic Interventions/Progress Updates:    Session 1: Pt seen for ADL retraining with focus on functional transfers, standing balance, functional mobility, and activity tolerance. Pt received supine in bed agreeable to shower. Therapist covered all dressings on BLEs. Completed supine>sit with min A for managing LLE due to pain. Pt ambulated bed>bathroom at CGA-SBA using RW with heavy reliance on BUEs. Completed bathing at shower level with min A sit<>stand and mod A to complete as she was fearful of releasing grab bar for buttocks hygiene. Completed dressing with increased time and min A for sit<>stand and standing balance. Therapist provided more assist with dressing due to time restrictions. Pt left sitting in w/c with all needs in reach. Pt continues to required increased time and cues of encouragement due to anxiety.  Session 2: Pt seen for 1:1 OT session with focus on functional mobility, dynamic standing balance, functional transfers, safety awareness, and activity tolerance. Pt received supine in bed requiring cues of encouragement for participation due to pain and fatigue. Pt completed supine>sit with min A and increased time for BLE management. Ambulated approx 5' bed>w/c with CGA using RW. Engaged in home management tasks of vacuuming and putting dishes away at supervision level. Pt demonstrated good safety with management of RW and vacuum. Pt reporting she will be sleeping on air mattress at home. Discussed concerns with this and recommendation for sleeping in regular bed or on couch. Pt reported she will speak to family. Pt also reporting bathroom is  on second level of home, therefore will like require BSC for downstairs. Engaged in dynamic standing balance task without UE support and pt requiring min A for balance. Engaged in functional mobility back to room with supervision while ambulating over carpet to simulate home environment. Pt left supine in bed with all needs in reach.   Therapy Documentation Precautions:  Precautions Precautions: Fall Restrictions Weight Bearing Restrictions: Yes RUE Weight Bearing: Weight bearing as tolerated RLE Weight Bearing: Weight bearing as tolerated LLE Weight Bearing: Weight bearing as tolerated General:   Vital Signs: Therapy Vitals Temp: 98.6 F (37 C) Temp Source: Oral Pulse Rate: 100 Resp: 18 BP: 98/60 mmHg Patient Position (if appropriate): Lying Oxygen Therapy SpO2: 98 % O2 Device: Not Delivered Pain: 7/10 pain B knees  See FIM for current functional status  Therapy/Group: Individual Therapy  Daneil Danerkinson, Leisa Gault N 05/27/2014, 7:47 AM

## 2014-05-28 ENCOUNTER — Inpatient Hospital Stay (HOSPITAL_COMMUNITY): Payer: Medicaid Other

## 2014-05-28 ENCOUNTER — Inpatient Hospital Stay (HOSPITAL_COMMUNITY): Payer: Medicaid Other | Admitting: Physical Therapy

## 2014-05-28 ENCOUNTER — Inpatient Hospital Stay (HOSPITAL_COMMUNITY): Payer: Medicaid Other | Admitting: Speech Pathology

## 2014-05-28 LAB — CBC
HCT: 26.7 % — ABNORMAL LOW (ref 36.0–46.0)
Hemoglobin: 8.5 g/dL — ABNORMAL LOW (ref 12.0–15.0)
MCH: 29.6 pg (ref 26.0–34.0)
MCHC: 31.8 g/dL (ref 30.0–36.0)
MCV: 93 fL (ref 78.0–100.0)
PLATELETS: 574 10*3/uL — AB (ref 150–400)
RBC: 2.87 MIL/uL — ABNORMAL LOW (ref 3.87–5.11)
RDW: 17.3 % — ABNORMAL HIGH (ref 11.5–15.5)
WBC: 10.5 10*3/uL (ref 4.0–10.5)

## 2014-05-28 MED ORDER — CIPROFLOXACIN HCL 250 MG PO TABS
250.0000 mg | ORAL_TABLET | Freq: Two times a day (BID) | ORAL | Status: DC
  Administered 2014-05-28 – 2014-05-31 (×7): 250 mg via ORAL
  Filled 2014-05-28 (×9): qty 1

## 2014-05-28 NOTE — Progress Notes (Signed)
Physical Therapy Session Note  Patient Details  Name: Tracy Foley XXXSamreth MRN: [id number] Date of Birth: 1995/01/27  Today's Date: 05/28/2014 PT Individual Time: 1130-1200 PT Individual Time Calculation (min): 30 min   Short Term Goals: Week 1:  PT Short Term Goal 1 (Week 1): Pt will transfer supine to edge of bed, edge of bed to supine with min A.  PT Short Term Goal 2 (Week 1): Pt will transfer bed to chair, chair to bed with min A.  PT Short Term Goal 3 (Week 1): Pt will ambulate with LRAD about 50 feet with min A.  PT Short Term Goal 4 (Week 1): Pt will propel w/c about 150 feet with S.  PT Short Term Goal 5 (Week 1): Pt will ascend/descend 1 step with no rails and assistive device with min A.   Skilled Therapeutic Interventions/Progress Updates:  Patient sitting in recliner upon entering room. Patient required coaxing to participate in therapy stating "I don't want to move." Patient agreed to try LE exercises in sitting. Patient reported pain in left > right. Patient performed the following: ankle dorsiflex/plantarflexion and ankle circles bilaterally; LAQ's through about 1/2 ROM due to pain; marching x 10 reps on right and active assistive x 10 reps on left; towel squeezes for hip adduction x 20 reps; and quad sets x 7 before patient said she could not do any more due to pain. Nurse notified that patient requesting pain medication. Patient left in recliner with LE's elevated and all objects in reach.  Therapy Documentation Precautions:  Precautions Precautions: Fall Restrictions Weight Bearing Restrictions: No RUE Weight Bearing: Weight bearing as tolerated RLE Weight Bearing: Weight bearing as tolerated LLE Weight Bearing: Weight bearing as tolerated  Pain: Pain Assessment Pain Assessment: 0-10 Pain Score: 8  Pain Type: Acute pain Pain Location: Knee Pain Orientation: Left Pain Descriptors / Indicators:  (pulling) Pain Onset: On-going Pain Intervention(s):  Repositioned;Emotional support (premedicated per RN)  See FIM for current functional status  Therapy/Group: Individual Therapy  Arelia LongestWindsor, Ziyan Hillmer M 05/28/2014, 12:11 PM

## 2014-05-28 NOTE — Progress Notes (Signed)
Occupational Therapy Session Note  Patient Details  Name: Tracy Foley MRN: [id number] Date of Birth: 1995/10/21  Today's Date: 05/28/2014 OT Individual Time: [id number] and 13:30-14:45 OT Individual Time Calculation (min): 48 min and 75 min   Short Term Goals: Week 1:  OT Short Term Goal 1 (Week 1): STG=LTG d/t short LOS  Skilled Therapeutic Interventions/Progress Updates:   Session 1: Pt seen for ADL retraining with a focus on functional transfers, functional mobility, standing balance, and activity tolerance.  Pt received supine in bed requesting to toilet. Pt hesitant to complete routine with therapist due to UTI/painful urination, but able to complete routine with encouragement cues. Pt ambulated bed>bathroom at CGA-SBA using RW to transfer to toilet. Pt completed dressing with increased time and encouragement cues for task completion at chair level with min A for sit<>stand and standing balance while dressing. Pt required max cues for reasoning/problem solving to wash dirty clothing as pt did not have clean clothing. Therapist suggested laundry completion. Pt ambulated approx 30'-40' with CGA using RW, requiring one rest break in w/c. Pt able to complete laundry routine while standing with CGA-SBA. Pt continues to need increased time for task completion and encouragement cues due to anxious/hesitant demeanor. Pt left reclined in chair with all needs within reach.   Session 2: Pt seen for 1:1 OT session with a focus on functional mobility, functional transfers, dynamic standing balance, money management, safety awareness, and activity tolerance. Pt received sitting in recliner requiring encouragement cues for participation in therapy due to pain and volition. Pt completed sit>stand with supervision and ambulated with RW approx 6675' with supervision. Pt engaged in money management cognitive task in standing for 10 min with supervision level. Pt required to plan, sequence, categorize, and  budget within components of task. Pt then ambulated approx. 100' to ADL apt to complete cooking task. Pt able to stand at supervision level for 15 min while completing simple 3-4 step meal prep task. Pt then engaged in functional mobility with supervision approx 125'. Noted increased engagement and affect from pt throughout session. Pt left in recliner with all needs in reach.   Therapy Documentation Precautions:  Precautions Precautions: Fall Restrictions Weight Bearing Restrictions: Yes RUE Weight Bearing: Weight bearing as tolerated RLE Weight Bearing: Weight bearing as tolerated LLE Weight Bearing: Weight bearing as tolerated General:   Vital Signs:   Pain: Pain Assessment Pain Score: 4  ADL: ADL ADL Comments: see FIM (refused female therapist) Exercises:   Other Treatments:    See FIM for current functional status  Therapy/Group: Individual Therapy  Alger Memosmily Nishka Heide 05/28/2014, 10:17 AM

## 2014-05-28 NOTE — Progress Notes (Signed)
Urine culture 05/25/2014 Escherichia coli sensitive to Cipro

## 2014-05-28 NOTE — Plan of Care (Signed)
Problem: RH PAIN MANAGEMENT Goal: RH STG PAIN MANAGED AT OR BELOW PT'S PAIN GOAL <5 on 0-10 pain scale  Outcome: Not Progressing Rates pain at a 6/10

## 2014-05-28 NOTE — Plan of Care (Signed)
Problem: RH Balance Goal: LTG Patient will maintain dynamic standing with ADLs (OT) LTG: Patient will maintain dynamic standing balance with assist during activities of daily living (OT)  Upgraded 5/24 to Mod I due to progress in therapy  Problem: RH Dressing Goal: LTG Patient will perform lower body dressing w/assist (OT) LTG: Patient will perform lower body dressing with assist, with/without cues in positioning using equipment (OT)  Upgraded 5/24 to Mod I due to progress in therapy  Problem: RH Toilet Transfers Goal: LTG Patient will perform toilet transfers w/assist (OT) LTG: Patient will perform toilet transfers with assist, with/without cues using equipment (OT)  Upgraded 5/24 to Mod I due to progress in therapy  Problem: RH Tub/Shower Transfers Goal: LTG Patient will perform tub/shower transfers w/assist (OT) LTG: Patient will perform tub/shower transfers with assist, with/without cues using equipment (OT)  Upgraded 5/24 to Mod I due to progress in therapy

## 2014-05-28 NOTE — Progress Notes (Signed)
Physical Therapy Session Note  Patient Details  Name: Tracy Foley MRN: [id number] Date of Birth: 1995/04/04  Today's Date: 05/28/2014 PT Individual Time: 1000-1100 PT Individual Time Calculation (min): 60 min   Short Term Goals: Week 1:  PT Short Term Goal 1 (Week 1): Pt will transfer supine to edge of bed, edge of bed to supine with min A.  PT Short Term Goal 2 (Week 1): Pt will transfer bed to chair, chair to bed with min A.  PT Short Term Goal 3 (Week 1): Pt will ambulate with LRAD about 50 feet with min A.  PT Short Term Goal 4 (Week 1): Pt will propel w/c about 150 feet with S.  PT Short Term Goal 5 (Week 1): Pt will ascend/descend 1 step with no rails and assistive device with min A.   Skilled Therapeutic Interventions/Progress Updates:   Session focused on functional ambulation, transfers, therapeutic tasks, strengthening, and activity tolerance. Patient ambulated using RW x 100 ft with close supervision, heavy dependence on BUE due to c/o increased pain in BLE. Patient propelled wheelchair remainder of distance to room with mod I. Patient performed sit <> stand using RW with supervision throughout session. Patient ambulated up/down ramp and up/down curb step using RW with supervision and verbal cues for technique and sequencing. Stair training up/down four 6" steps using 2 rails with step-to sequencing and close supervision. For BUE strengthening and overall activity tolerance, performed UBE at level 10 x 6 min total, alternating between pushing forward and backward every 2 min with 1 rest break. Patient stood using RW to retrieve clothes from dryer with supervision. Patient ambulated back to room using RW with supervision and left sitting in recliner with BLE elevated and needs within reach. Patient required increased time to complete all functional mobility and mod coaxing for participation due to patient stating, "It hurts," and wanting to sleep.     Therapy  Documentation Precautions:  Precautions Precautions: Fall Restrictions Weight Bearing Restrictions: No RUE Weight Bearing: Weight bearing as tolerated RLE Weight Bearing: Weight bearing as tolerated LLE Weight Bearing: Weight bearing as tolerated Vital Signs: Therapy Vitals Pulse Rate: (!) 114 BP: (!) 93/56 mmHg Patient Position (if appropriate): Sitting Oxygen Therapy SpO2: 100 % O2 Device: Not Delivered Pain: Pain Assessment Pain Assessment: 0-10 Pain Score: 7  Pain Type: Acute pain Pain Location: Knee Pain Orientation: Right;Left Pain Descriptors / Indicators:  (pulling) Pain Onset: On-going Pain Intervention(s): Repositioned;Emotional support (premedicated per RN) Locomotion : Ambulation Ambulation/Gait Assistance: 5: Supervision   See FIM for current functional status  Therapy/Group: Individual Therapy  Kerney ElbeVarner, Kareem Cathey A 05/28/2014, 10:49 AM
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
## 2014-05-28 NOTE — Plan of Care (Signed)
Problem: RH PAIN MANAGEMENT Goal: RH STG PAIN MANAGED AT OR BELOW PT'S PAIN GOAL <5 on 0-10 pain scale  Outcome: Not Progressing Pt reports pain as 7.5

## 2014-05-28 NOTE — Plan of Care (Signed)
Problem: RH PAIN MANAGEMENT Goal: RH STG PAIN MANAGED AT OR BELOW PT'S PAIN GOAL <5 on 0-10 pain scale  Outcome: Not Progressing Rates pain at 6/10
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
## 2014-05-29 ENCOUNTER — Inpatient Hospital Stay (HOSPITAL_COMMUNITY): Payer: Medicaid Other

## 2014-05-29 ENCOUNTER — Inpatient Hospital Stay (HOSPITAL_COMMUNITY): Payer: Self-pay | Admitting: Occupational Therapy

## 2014-05-29 ENCOUNTER — Inpatient Hospital Stay (HOSPITAL_COMMUNITY): Payer: Medicaid Other | Admitting: Physical Therapy

## 2014-05-29 ENCOUNTER — Inpatient Hospital Stay (HOSPITAL_COMMUNITY): Payer: Self-pay | Admitting: Physical Therapy

## 2014-05-29 MED ORDER — POLYETHYLENE GLYCOL 3350 17 G PO PACK: 17.0000 g | PACK | Freq: Every day | ORAL | Status: DC

## 2014-05-29 MED ORDER — METHOCARBAMOL 500 MG PO TABS: 500.0000 mg | ORAL_TABLET | Freq: Four times a day (QID) | ORAL | Status: DC | PRN

## 2014-05-29 MED ORDER — HYDROCODONE-ACETAMINOPHEN 10-325 MG PO TABS: 0.5000 | ORAL_TABLET | ORAL | Status: DC | PRN

## 2014-05-29 MED ORDER — CIPROFLOXACIN HCL 250 MG PO TABS: 250.0000 mg | ORAL_TABLET | Freq: Two times a day (BID) | ORAL | Status: DC

## 2014-05-29 MED ORDER — DOCUSATE SODIUM 100 MG PO CAPS: 100.0000 mg | ORAL_CAPSULE | Freq: Two times a day (BID) | ORAL | Status: DC

## 2014-05-29 MED ORDER — FERROUS SULFATE 325 (65 FE) MG PO TABS: 325.0000 mg | ORAL_TABLET | Freq: Two times a day (BID) | ORAL | Status: DC
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
## 2014-05-29 NOTE — Progress Notes (Signed)
Physical Therapy Session Note  Patient Details  Name: Tracy Foley XXXSamreth MRN: [id number] Date of Birth: April 07, 1995  Today's Date: 05/29/2014 PT Individual Time: 0900-1000 PT Individual Time Calculation (min): 60 min   Short Term Goals: Week 1:  PT Short Term Goal 1 (Week 1): Pt will transfer supine to edge of bed, edge of bed to supine with min A.  PT Short Term Goal 2 (Week 1): Pt will transfer bed to chair, chair to bed with min A.  PT Short Term Goal 3 (Week 1): Pt will ambulate with LRAD about 50 feet with min A.  PT Short Term Goal 4 (Week 1): Pt will propel w/c about 150 feet with S.  PT Short Term Goal 5 (Week 1): Pt will ascend/descend 1 step with no rails and assistive device with min A.   Skilled Therapeutic Interventions/Progress Updates:   Session focused on increasing independence with bed mobility and community reintegration with focus on community/outdoor ambulation. Patient ambulated using RW x 100 ft with mod I to day room to practice bed mobility to simulate home environment (patient plans to sleep on couch). Patient transferred supine <> sit to R and L with mod I due to increased time and used BUE to bring BLE on and off mat table. Security guard present to escort patient off rehab unit. Patient propelled wheelchair using BUE throughout hospital including on/off elevators and over thresholds with mod I. Patient ambulated using RW x 150 ft outdoors on brick and concrete, uneven surfaces, inclines/declines, over thresholds, and up/down 5 bricks steps using R rail ascending with supervision. Patient performed transfers to variety of surfaces using RW with mod I. Patient's sister present for second half of session and discussed discharge planning, f/u therapies, and DME needs. Patient expressing concern about going home and "not doing anything," engaged in discussion regarding patient's interests and safe activities patient can participate in after discharge, both verbalized  understanding. Patient propelled wheelchair back to room with mod I and left sitting in wheelchair with family present.   Therapy Documentation Precautions:  Precautions Precautions: Fall Restrictions Weight Bearing Restrictions: No RUE Weight Bearing: Weight bearing as tolerated RLE Weight Bearing: Weight bearing as tolerated LLE Weight Bearing: Weight bearing as tolerated Pain: Pain Assessment Pain Assessment: 0-10 Pain Score: 6  Pain Type: Acute pain Pain Location: Knee Pain Orientation: Right;Left Pain Descriptors / Indicators: Aching Pain Onset: On-going Pain Intervention(s): Emotional support;Ambulation/increased activity Locomotion : Ambulation Ambulation/Gait Assistance: 5: Supervision   See FIM for current functional status  Therapy/Group: Individual Therapy  Kerney ElbeVarner, Romell Cavanah A 05/29/2014, 12:22 PM

## 2014-05-29 NOTE — Progress Notes (Signed)
Occupational Therapy Session Note  Patient Details  Name: Tracy Foley MRN: [id number] Date of Birth: 01-06-1995  Today's Date: 05/29/2014 OT Individual Time: 0700-0800 and 1300-1400      Short Term Goals: Week 1:  OT Short Term Goal 1 (Week 1): STG=LTG d/t short LOS  Skilled Therapeutic Interventions/Progress Updates:    Session 1: Pt seen for ADL retraining with a focus on functional transfers, functional mobility, standing balance, and activity tolerance. Pt received supine in bed, requiring encouragement cues to get OOB and complete morning routine. Pt then agreeable to shower and dress. Therapist covered dressings on LE. Pt completed supine>sit with min A for managing her LLE due to pain/stiffness. Pt ambulated from her bed>bathroom at supervision level using RW to transfer to shower seat. Pt completed bathing at shower level with supervision from sit<>stand. Pt able to complete all steps of bathing process and shave BLE around dressing area. Pt then completed dressing w/ setup assist and increased time for donning clothing. Pt completed grooming at sink level standing w/ RW at supervision level. Pt left sitting in recliner with all needs in reach.   Session 2: Pt seen for 1:1 OT session with a focus on functional mobility, dynamic standing balance, functional transfers, activity tolerance, cognitive training, and safety awareness. Pt received sitting in recliner. Pt completed sit>stand with supervision and ambulated apprx 120' with RW requiring supervision. Pt engaged in dual task activity of requiring pt to use skills of motor ball toss and cognitive skills of categorization and alphabetizing. Noted good standing balance with motor task as pt was able to maintain dynamic balance without UE support. Pt then ambulated to small room and completed cognitive task of completing daily schedule list. Pt asked to compile a list of activities that she would like to complete daily, to be included  in home program. Pt then completed copy design task while standing for apprx. 5 min. Pt then engaged in functional mobility back to room at supervision level apprx 75'. Pt left in recliner with all needs within reach.   Therapy Documentation Precautions:  Precautions Precautions: Fall Restrictions Weight Bearing Restrictions: No RUE Weight Bearing: Weight bearing as tolerated RLE Weight Bearing: Weight bearing as tolerated LLE Weight Bearing: Weight bearing as tolerated General:   Vital Signs:  Pain:    See FIM for current functional status  Therapy/Group: Individual Therapy  Alger Memosmily Mischele Detter 05/29/2014, 12:05 PM

## 2014-05-29 NOTE — Progress Notes (Signed)
Social Work Patient ID: Tracy Foley XXXSamreth, female   DOB: 1995/04/23, 19 y.o.   MRN: [id number]  Have reviewed team conference with pt, brother and sister-in-law.  All aware and agreeable with targeted d/c for 5/27.  Review of d/c referrals I will make and follow up medical care.  Sister-in-law to go ahead and fill prescriptions today to ensure they will run through on pt's Medicaid (there has been conflicting reports about whether her MA case is still active).  Will continue to follow for d/c planning and support.  Lien Lyman, LCSW
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
## 2014-05-29 NOTE — Progress Notes (Signed)
Physical Therapy Session Note  Patient Details  Name: Tracy Foley MRN: [id number] Date of Birth: 03/22/1995  Today's Date: 05/29/2014 PT Individual Time: 0815-0850 PT Individual Time Calculation (min): 35 min   Short Term Goals: Week 1:  PT Short Term Goal 1 (Week 1): Pt will transfer supine to edge of bed, edge of bed to supine with min A.  PT Short Term Goal 2 (Week 1): Pt will transfer bed to chair, chair to bed with min A.  PT Short Term Goal 3 (Week 1): Pt will ambulate with LRAD about 50 feet with min A.  PT Short Term Goal 4 (Week 1): Pt will propel w/c about 150 feet with S.  PT Short Term Goal 5 (Week 1): Pt will ascend/descend 1 step with no rails and assistive device with min A.  Therapy Documentation Precautions:  Precautions Precautions: Fall Restrictions Weight Bearing Restrictions: No RUE Weight Bearing: Weight bearing as tolerated RLE Weight Bearing: Weight bearing as tolerated LLE Weight Bearing: Weight bearing as tolerated Pain: Pain Assessment Pain Score: 7  Pain Location: Leg (right and left ) Pain Descriptors / Indicators: Shooting;Throbbing   Patient received seated in chair bedside. Patient agreeable to participate in therapy with moderate encouragement.  Sit to and from stand transfer with RW WBAT bilateral lower extremity mod I Ambulatory transfer with RW WBAT bilateral lower extremity mod I  Patient ambulated 250 feet and 300 feet mod I with RW WBAT bilateral lower extremity on smooth level tile surfaces in a controlled environment.  B heel raises 30x with RW B hip flexion 3x10 initially AAROM progressed to AROM B LAQ 3x10 initially AAROM progressed to AROM  Patient ascended and descend 12 steps with bilateral handrails initially min assist secondary to anxiety, however progressed to close supervision with verbal cues for sequence after first four steps. Patient declined to attempt with bilateral upper extremity support on unilateral  handrail.   Patient returned to bed at end of session with all needs met call bell and phone within reach. Bed alarm engaged.     Therapy/Group: Individual Therapy  Retta Diones 05/29/2014, 3:36 PM

## 2014-05-29 NOTE — Progress Notes (Signed)
Occupational Therapy Session Note  Patient Details  Name: Tracy Foley MRN: [id number] Date of Birth: 04/13/1995  Today's Date: 05/29/2014 OT Individual Time: 1400-1500 OT Individual Time Calculation (min): 60 min    Short Term Goals: Week 1:  OT Short Term Goal 1 (Week 1): STG=LTG d/t short LOS  Skilled Therapeutic Interventions/Progress Updates:  Patient received seated in w/c in room with no complaints of pain, but complaints of being "sleepy". Min encouragement required for participation this session. Patient propelled w/c from room > therapy gym. Patient transferred onto yoga/therapy ball to focus on dynamic sitting balance/tolerance/endurance and trunk control/strengthening. Pt with anxiety during beginning of this exercise, stating "I'm scared I'm going to fall off". Therapist provided hands on support and emotional support. Pt able to sit on therapy ball ~10 minutes without UE support, completing activity on table top. Then focused on functional ambulation throughout hallway, in ADL apartment, on various surfaces while using RW. Pt completed simple meal prep in kitchen while using RW. Min verbal cues for safety and sequencing during functional mobility (turns, side steps, backward stepping). Throughout session, pt required min encouragement to participate. At end of session, left patient supine in bed with all needs within reach.   Therapy Documentation Precautions:  Precautions Precautions: Fall Restrictions Weight Bearing Restrictions: No RUE Weight Bearing: Weight bearing as tolerated RLE Weight Bearing: Weight bearing as tolerated LLE Weight Bearing: Weight bearing as tolerated  Vital Signs: Therapy Vitals Temp: 98.4 F (36.9 C) Temp Source: Oral Pulse Rate: 100 Resp: 16 BP: 104/63 mmHg Patient Position (if appropriate): Lying Oxygen Therapy SpO2: 99 % O2 Device: Not Delivered  ADL: ADL ADL Comments: see FIM (refused female therapist)  See FIM for current  functional status  Therapy/Group: Individual Therapy  Arta Stump , MS, OTR/L, CLT Pager: [phone]  05/29/2014, 3:31 PM

## 2014-05-30 ENCOUNTER — Inpatient Hospital Stay (HOSPITAL_COMMUNITY): Payer: Medicaid Other

## 2014-05-30 ENCOUNTER — Inpatient Hospital Stay (HOSPITAL_COMMUNITY): Payer: Self-pay

## 2014-05-30 ENCOUNTER — Inpatient Hospital Stay (HOSPITAL_COMMUNITY): Payer: Medicaid Other | Admitting: Physical Therapy

## 2014-05-30 ENCOUNTER — Inpatient Hospital Stay (HOSPITAL_COMMUNITY): Payer: Medicaid Other | Admitting: Speech Pathology
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
## 2014-05-30 NOTE — Progress Notes (Signed)
Occupational Therapy Discharge Summary  Patient Details  Name: Tracy Foley MRN: [id number] Date of Birth: 1995/06/20  Today's Date: 05/30/2014 OT Individual Time: 0700-0800 and 1300-1400 OT Individual Time Calculation (min): 60 min and 60 min   Patient has met 11 of 11 long term goals due to improved activity tolerance, improved balance, postural control and improved awareness.  Patient to discharge at overall Modified Independent level.  Patient's care partner is independent to provide the necessary physical and cognitive assistance at discharge.    Reasons goals not met: N/A. Pt met all LTG assigned.   Recommendation:  No skilled occupational therapy recommended.   Equipment: TTB  Reasons for discharge: treatment goals met and discharge from hospital  Patient/family agrees with progress made and goals achieved: Yes  Skilled Therapeutic Intervention  Session 1: Pt seen for ADL retraining with a focus on functional mobility, functional transfers, activity tolerance, and dynamic standing balance. Pt received supine asleep in bed, requiring moderate encouragement to begin therapy session morning routine due to pain in LE. Pt agreeable to shower and therapist assisted to cover all dressings. Completed supine>sit with Mod I using RW. Pt ambulated from EOB>bathroom Mod I using RW. Pt completed bathing at shower seat level Mod I. Pt able to select all clothing and dress EOB at Mod I level for increased time. Pt completed sit>stand using RW and completed grooming standing at sink. Pt left supine in bed per patient request, with all needs in reach.   Session 2: Pt seen for 1:1 OT session with a focus on functional mobility, functional transfers, dynamic standing balance, activity tolerance, and safety awareness. Pt received supine in bed and required min encouragement cues to get OOB and complete therapy. Pt completed supine>sit with Mod I. Pt completed sit>stand and ambulated apprx 150'  with RW at Mod I level. Pt engaged in 6-step cooking task in ADL apt kitchen. Pt able to stand at Mod I level for apprx 40 minutes with one rest break. Discussed home program activities and daily schedule. Therapist provided theraband home exercise plan for pt to complete during sedentary daily activities. Pt then engaged in functional mobility apprx 150' at Mod I level. Pt left supine in bed with all needs within reach.   OT Discharge Precautions/Restrictions  Precautions Precautions: Fall Restrictions Weight Bearing Restrictions: No RLE Weight Bearing: Weight bearing as tolerated LLE Weight Bearing: Weight bearing as tolerated General   Vital Signs Therapy Vitals Temp: 98.2 F (36.8 C) Temp Source: Oral Pulse Rate: 86 Resp: 18 BP: 103/66 mmHg Patient Position (if appropriate): Lying Oxygen Therapy SpO2: 100 % O2 Device: Not Delivered Pain Pain Assessment Pain Assessment: 0-10 Pain Score: 7  Pain Location: Leg Pain Orientation: Right;Left ADL ADL ADL Comments: see FIM (refused female therapist) Vision/Perception  Vision- History Baseline Vision/History: No visual deficits Patient Visual Report: No change from baseline Vision- Assessment Vision Assessment?: No apparent visual deficits  Cognition Overall Cognitive Status: Within Functional Limits for tasks assessed Arousal/Alertness: Awake/alert Orientation Level: Oriented X4 Attention: Alternating Alternating Attention: Appears intact Memory: Appears intact Awareness: Appears intact Problem Solving: Appears intact Safety/Judgment: Appears intact Sensation Sensation Light Touch: Appears Intact Stereognosis: Appears Intact Hot/Cold: Appears Intact Proprioception: Appears Intact Coordination Gross Motor Movements are Fluid and Coordinated: No Fine Motor Movements are Fluid and Coordinated: Yes Coordination and Movement Description: gross motor limited by pain  Motor  Motor Motor: Within Functional  Limits Mobility  Bed Mobility Bed Mobility: Supine to Sit Supine to Sit: 6: Modified independent (Device/Increase  time) Sit to Supine: 6: Modified independent (Device/Increase time) Transfers Transfers: Sit to Stand;Stand to Sit Sit to Stand: 6: Modified independent (Device/Increase time) Stand to Sit: 6: Modified independent (Device/Increase time)  Trunk/Postural Assessment  Cervical Assessment Cervical Assessment: Within Functional Limits Thoracic Assessment Thoracic Assessment: Within Functional Limits Lumbar Assessment Lumbar Assessment: Exceptions to [facility] (scoliosis PTA) Postural Control Postural Control: Within Functional Limits  Balance Balance Balance Assessed: Yes Static Sitting Balance Static Sitting - Balance Support: Feet supported Static Sitting - Level of Assistance: 7: Independent Static Standing Balance Static Standing - Balance Support: During functional activity Static Standing - Level of Assistance: 6: Modified independent (Device/Increase time) Dynamic Standing Balance Dynamic Standing - Balance Support: During functional activity Dynamic Standing - Level of Assistance: 6: Modified independent (Device/Increase time) Extremity/Trunk Assessment RUE Assessment RUE Assessment: Within Functional Limits RUE Strength RUE Overall Strength: Within Functional Limits for tasks performed LUE Assessment LUE Assessment: Within Functional Limits  See FIM for current functional status  Dorann Ou 05/30/2014, 7:31 AM

## 2014-05-30 NOTE — Discharge Summary (Signed)
Discharge summary job [id number]

## 2014-05-30 NOTE — Progress Notes (Addendum)
Speech Language Pathology Session Note & Discharge Summary  Patient Details  Name: Tracy Foley MRN: [id number] Date of Birth: July 27, 1995  Today's Date: 05/30/2014 SLP Individual Time: 1500-1530 SLP Individual Time Calculation (min): 30 min   Skilled Therapeutic Interventions:  Skilled treatment session focused on cognitive goals. SLP facilitated session by facilitating a functional conversation with focus on anticipatory awareness in regards to tasks patient can complete at home in order to stay cognitively and physically active. Patient generated a list of tasks with Mod I. Patient left supine in bed with all needs within reach.   Patient has met 3 of 3 long term goals.  Patient to discharge at overall Modified Independent level.   Reasons goals not met: N/A   Clinical Impression/Discharge Summary: Patient has made functional gains and has met 3 of 3 LTG's this admission due to increased complex problem solving, awareness and recall of new information. Currently, patient is demonstrating behaviors consistent with a Rancho Level VIII and is at her baseline level of cognitive function and is Mod I to complete functional tasks safely.  Patient will discharge home with assistance from family. Due to patient being at her baseline level of cognitive functioning, skilled SLP intervention is not warranted at this time.   Care Partner:  Caregiver Able to Provide Assistance: Yes     Recommendation:  None      Equipment: N/A   Reasons for discharge: Treatment goals met;Discharged from hospital   Patient/Family Agrees with Progress Made and Goals Achieved: Yes   See FIM for current functional status  Amoree Newlon 05/30/2014, 3:46 PM

## 2014-05-30 NOTE — Progress Notes (Addendum)
Physical Therapy Discharge Summary  Patient Details  Name: Tracy Foley MRN: [id number] Date of Birth: [birth year]  Today's Date: 05/30/2014 PT Individual Time: 1005-1100 PT Individual Time Calculation (min): 55 min    Patient has met 9 of 9 long term goals due to improved activity tolerance, improved balance, improved postural control, increased strength, increased range of motion, decreased pain, ability to compensate for deficits and functional use of  right lower extremity and left lower extremity.  Patient to discharge at an ambulatory level Modified Independent.   Patient's care partner is independent to provide the necessary physical assistance at discharge. Patient is demonstrating behaviors consistent with a Rancho Level VIII and is at her baseline level of cognitive function.  Reasons goals not met: NA  Recommendation:  Patient will benefit from ongoing skilled PT services in outpatient setting to continue to advance safe functional mobility, address ongoing impairments in pain management, weightbearing tolerance, strength, ROM, balance, activity tolerance, and minimize fall risk.  Equipment: RW  Reasons for discharge: treatment goals met and discharge from hospital  Patient/family agrees with progress made and goals achieved: Yes  Skilled Therapeutic Intervention Patient currently modified independent using RW and supervision to negotiate 12 stairs using 1 rail to reach second floor of apartment with increased time for functional mobility. Seated/standing BLE therex: LAQ 3 x 10 each LE, standing heel raises without RW x 30, standing alt knee flexion using RW x 30, standing hip abduction using RW x 5 each LE. Patient provided with HEP handout. See below for further discharge details. Patient with no further questions regarding discharge. Patient mod I in room with use of RW.   PT Discharge Precautions/Restrictions Precautions Precautions: Fall Restrictions Weight  Bearing Restrictions: No RLE Weight Bearing: Weight bearing as tolerated LLE Weight Bearing: Weight bearing as tolerated Pain Pain Assessment Pain Assessment: 0-10 Pain Score: 6  Pain Type: Acute pain Pain Location: Knee Pain Orientation: Right;Left Pain Descriptors / Indicators: Sore Pain Onset: On-going Pain Intervention(s): Emotional support;Ambulation/increased activity;Repositioned Vision/Perception   No changes from baseline  Cognition Overall Cognitive Status: Within Functional Limits for tasks assessed Arousal/Alertness: Awake/alert Orientation Level: Oriented X4 Attention: Alternating Alternating Attention: Appears intact Memory: Appears intact Awareness: Appears intact Problem Solving: Appears intact Safety/Judgment: Appears intact Sensation Sensation Light Touch: Impaired Detail Light Touch Impaired Details: Impaired RLE;Impaired LLE Stereognosis: Not tested Hot/Cold: Appears Intact Proprioception: Appears Intact Additional Comments: Reports numbness B thighs  Coordination Gross Motor Movements are Fluid and Coordinated: No Fine Motor Movements are Fluid and Coordinated: Yes Coordination and Movement Description: gross motor limited by pain and premorbid scoliosis Motor  Motor Motor: Within Functional Limits  Mobility Bed Mobility Bed Mobility: Supine to Sit;Sit to Supine Supine to Sit: 6: Modified independent (Device/Increase time) Sit to Supine: 6: Modified independent (Device/Increase time) Transfers Transfers: Yes Sit to Stand: 6: Modified independent (Device/Increase time) Stand to Sit: 6: Modified independent (Device/Increase time) Locomotion  Ambulation Ambulation: Yes Ambulation/Gait Assistance: 6: Modified independent (Device/Increase time) Ambulation Distance (Feet): 200 Feet Assistive device: Rolling walker Gait Gait: Yes Gait Pattern: Impaired Gait Pattern: Step-through pattern;Decreased stance time - left;Decreased stance time -  right;Antalgic (heavy dependence on BUE) Gait velocity: 10 MWT using RW = 0.3 m/s Stairs / Additional Locomotion Stairs: Yes Stairs Assistance: 5: Supervision Stair Management Technique: One rail Right;Step to pattern Number of Stairs: 12 Height of Stairs: 6 Ramp: 6: Modified independent (Device) Curb: 6: Modified independent (Device/increase time) Product manager Mobility: Yes Wheelchair Assistance: 6: Modified independent (Device/Increase time) Wheelchair  Propulsion: Both upper extremities Wheelchair Parts Management: Independent Distance: 300 ft  Trunk/Postural Assessment  Cervical Assessment Cervical Assessment: Within Functional Limits Thoracic Assessment Thoracic Assessment: Within Functional Limits Lumbar Assessment Lumbar Assessment: Exceptions to WFL (scoliosis PTA) Postural Control Postural Control: Within Functional Limits  Balance Balance Balance Assessed: Yes Static Sitting Balance Static Sitting - Balance Support: Feet supported Static Sitting - Level of Assistance: 7: Independent Static Standing Balance Static Standing - Balance Support: During functional activity Static Standing - Level of Assistance: 6: Modified independent (Device/Increase time) Dynamic Standing Balance Dynamic Standing - Balance Support: During functional activity Dynamic Standing - Level of Assistance: 6: Modified independent (Device/Increase time) Extremity Assessment  RUE Assessment RUE Assessment: Within Functional Limits RUE Strength RUE Overall Strength: Within Functional Limits for tasks performed LUE Assessment LUE Assessment: Within Functional Limits RLE Assessment RLE Assessment: Exceptions to [facility] RLE AROM (degrees) Overall AROM Right Lower Extremity: Deficits;Due to pain;Due to decreased strength RLE Overall AROM Comments: Full hip/ankle AROM, decreased knee flexion/extension  RLE Strength RLE Overall Strength: Deficits;Due to pain RLE Overall Strength  Comments: unable to formally assess due to pain/anxiety LLE Assessment LLE Assessment: Exceptions to WFL LLE AROM (degrees) Overall AROM Left Lower Extremity: Deficits;Due to precautions;Due to pain;Due to decreased strength LLE Overall AROM Comments: Full hip/ankle ROM, decreased knee flexion/extension  LLE Strength LLE Overall Strength: Deficits;Due to pain LLE Overall Strength Comments: unable to formally assess due to pain/anxiety  See FIM for current functional status  Laretta Alstrom 05/30/2014, 7:56 AM

## 2014-05-30 NOTE — Progress Notes (Signed)
Staples removed without complications, patient tolerated well. One steri-strip used on incision at right upper thigh.
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
## 2014-05-30 NOTE — Progress Notes (Signed)
Occupational Therapy Note  Patient Details  Name: Nori RiisChandanee XXXSamreth MRN: [id number] Date of Birth: [birth year]  Today's Date: 05/30/2014 OT Individual Time: 1100-1200 OT Individual Time Calculation (min): 60 min  Pt denied pain Individual Therapy  Pt resting on EOB upon arrival and required min encouragement to participate.  Pt engaged in functional amb with RW and dynamic standing tasks with focus on activity tolerance, dynamic standing balance, and safety awareness.  Pt engaged in game of Bocce ball requiring BUE use while standing and score keeping.  Pt did not require any assistance throughout session.    Lavone NeriLanier, [facility] 05/30/2014, 12:01 PM

## 2014-05-31 NOTE — Progress Notes (Signed)
Pt discharged home with sister. Discharge information given by Deatra Inaan Angiulli, PA. Patient verbalized understanding and sent home with belongings.

## 2014-05-31 NOTE — Discharge Instructions (Signed)
Inpatient Rehab Discharge Instructions  Tracy Foley Discharge date and time: No discharge date for patient encounter.   Activities/Precautions/ Functional Status: Activity: activity as tolerated Diet: regular diet Wound Care: keep wound clean and dry Functional status:  ___ No restrictions     ___ Walk up steps independently ___ 24/7 supervision/assistance   ___ Walk up steps with assistance ___ Intermittent supervision/assistance  ___ Bathe/dress independently ___ Walk with walker     ___ Bathe/dress with assistance ___ Walk Independently    ___ Shower independently _x__ Walk with assistance    ___ Shower with assistance ___ No alcohol     ___ Return to work/school ________     COMMUNITY REFERRALS UPON DISCHARGE:    Home Health:   PT                     Agency: [facility] Phone: [phone]   Medical Equipment/Items Ordered: rolling walker, tub bench                                                     Agency/Supplier: [facility] @ [phone]     Special Instructions:    My questions have been answered and I understand these instructions. I will adhere to these goals and the provided educational materials after my discharge from the hospital.  Patient/Caregiver Signature _______________________________ Date __________  Clinician Signature _______________________________________ Date __________  Please bring this form and your medication list with you to all your follow-up doctor's appointments.

## 2014-05-31 NOTE — Progress Notes (Signed)
Social Work  Discharge Note  The overall goal for the admission was met for:   Discharge location: Yes - home with brother and sister-in-law  Length of Stay: Yes - 7 days  Discharge activity level: Yes - modified independent  Home/community participation: Yes  Services provided included: MD, RD, PT, OT, RN, TR, Pharmacy and [place]: Medicaid  Follow-up services arranged: Home Health: PT via [place], DME: youth rolling walker, tub bench via [place] and Patient/Family has no preference for HH/DME agencies  Comments (or additional information):  Patient/Family verbalized understanding of follow-up arrangements: Yes  Individual responsible for coordination of the follow-up plan: pt  Confirmed correct DME delivered: Munachimso Rigdon 05/31/2014    Orvel Cutsforth
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
## 2014-06-12 ENCOUNTER — Inpatient Hospital Stay: Payer: Self-pay | Admitting: Family Medicine

## 2014-06-24 DIAGNOSIS — S72401D Unspecified fracture of lower end of right femur, subsequent encounter for closed fracture with routine healing: Secondary | ICD-10-CM | POA: Diagnosis not present

## 2014-06-24 DIAGNOSIS — S72402D Unspecified fracture of lower end of left femur, subsequent encounter for closed fracture with routine healing: Secondary | ICD-10-CM | POA: Diagnosis not present

## 2014-06-24 DIAGNOSIS — Z87440 Personal history of urinary (tract) infections: Secondary | ICD-10-CM | POA: Diagnosis not present

## 2015-03-25 ENCOUNTER — Encounter (HOSPITAL_COMMUNITY): Payer: Self-pay | Admitting: *Deleted

## 2015-03-25 ENCOUNTER — Emergency Department (HOSPITAL_COMMUNITY)
Admission: EM | Admit: 2015-03-25 | Discharge: 2015-03-25 | Disposition: A | Discharge status: Home / Self Care | Payer: No Typology Code available for payment source | Attending: Emergency Medicine | Admitting: Emergency Medicine

## 2015-03-25 DIAGNOSIS — F17210 Nicotine dependence, cigarettes, uncomplicated: Secondary | ICD-10-CM | POA: Insufficient documentation

## 2015-03-25 DIAGNOSIS — J45909 Unspecified asthma, uncomplicated: Secondary | ICD-10-CM | POA: Insufficient documentation

## 2015-03-25 DIAGNOSIS — N644 Mastodynia: Secondary | ICD-10-CM | POA: Insufficient documentation

## 2015-03-25 NOTE — ED Notes (Signed)
No answer from patient when called from lobby x3. 

## 2015-03-25 NOTE — ED Notes (Signed)
Pt states that she began having left breast pain last pm; pt states that she feels a "ball" to her breast and states "It is burning"; pt denies redness or swelling to area

## 2015-03-25 NOTE — ED Notes (Signed)
No answer from patient when called from lobby x 1.  

## 2015-03-25 NOTE — ED Notes (Signed)
No answer from patient when called from lobby x2 

## 2016-01-05 DIAGNOSIS — O1490 Unspecified pre-eclampsia, unspecified trimester: Secondary | ICD-10-CM

## 2019-01-05 DIAGNOSIS — O1410 Severe pre-eclampsia, unspecified trimester: Secondary | ICD-10-CM

## 2020-06-11 ENCOUNTER — Ambulatory Visit (HOSPITAL_COMMUNITY): Payer: Medicaid Other | Admitting: Licensed Clinical Social Worker

## 2020-06-11 ENCOUNTER — Other Ambulatory Visit: Payer: Self-pay

## 2020-07-24 DIAGNOSIS — G40901 Epilepsy, unspecified, not intractable, with status epilepticus: Secondary | ICD-10-CM | POA: Insufficient documentation

## 2020-09-19 ENCOUNTER — Ambulatory Visit (HOSPITAL_COMMUNITY)
Admission: EM | Admit: 2020-09-19 | Discharge: 2020-09-19 | Disposition: A | Discharge status: Home / Self Care | Payer: Medicaid Other | Attending: Emergency Medicine | Admitting: Emergency Medicine

## 2020-09-19 ENCOUNTER — Emergency Department (HOSPITAL_COMMUNITY)
Admission: EM | Admit: 2020-09-19 | Discharge: 2020-09-19 | Disposition: A | Discharge status: Home / Self Care | Payer: Medicaid Other | Attending: Student | Admitting: Student

## 2020-09-19 ENCOUNTER — Encounter (HOSPITAL_COMMUNITY): Payer: Self-pay | Admitting: Emergency Medicine

## 2020-09-19 ENCOUNTER — Encounter (HOSPITAL_COMMUNITY): Payer: Self-pay

## 2020-09-19 ENCOUNTER — Other Ambulatory Visit: Payer: Self-pay

## 2020-09-19 DIAGNOSIS — Z3201 Encounter for pregnancy test, result positive: Secondary | ICD-10-CM | POA: Diagnosis not present

## 2020-09-19 DIAGNOSIS — N939 Abnormal uterine and vaginal bleeding, unspecified: Secondary | ICD-10-CM | POA: Diagnosis not present

## 2020-09-19 DIAGNOSIS — Z5321 Procedure and treatment not carried out due to patient leaving prior to being seen by health care provider: Secondary | ICD-10-CM | POA: Insufficient documentation

## 2020-09-19 DIAGNOSIS — O4690 Antepartum hemorrhage, unspecified, unspecified trimester: Secondary | ICD-10-CM | POA: Diagnosis not present

## 2020-09-19 LAB — COMPREHENSIVE METABOLIC PANEL
ALT: 8 U/L (ref 0–44)
AST: 16 U/L (ref 15–41)
Albumin: 4.1 g/dL (ref 3.5–5.0)
Alkaline Phosphatase: 71 U/L (ref 38–126)
Anion gap: 7 (ref 5–15)
BUN: <5 mg/dL — ABNORMAL LOW (ref 6–20)
CO2: 25 mmol/L (ref 22–32)
Calcium: 9.6 mg/dL (ref 8.9–10.3)
Chloride: 106 mmol/L (ref 98–111)
Creatinine, Ser: 0.59 mg/dL (ref 0.44–1.00)
GFR, Estimated: >60 mL/min (ref >60)
Glucose, Bld: 97 mg/dL (ref 70–99)
Potassium: 3.9 mmol/L (ref 3.5–5.1)
Sodium: 138 mmol/L (ref 135–145)
Total Bilirubin: 0.7 mg/dL (ref 0.3–1.2)
Total Protein: 7.4 g/dL (ref 6.5–8.1)

## 2020-09-19 LAB — CBC WITH DIFFERENTIAL/PLATELET
Abs Immature Granulocytes: 0.02 10*3/uL (ref 0.00–0.07)
Basophils Absolute: 0 10*3/uL (ref 0.0–0.1)
Basophils Relative: 1 %
Eosinophils Absolute: 0.5 10*3/uL (ref 0.0–0.5)
Eosinophils Relative: 6 %
HCT: 36.1 % (ref 36.0–46.0)
Hemoglobin: 11.2 g/dL — ABNORMAL LOW (ref 12.0–15.0)
Immature Granulocytes: 0 %
Lymphocytes Relative: 25 %
Lymphs Abs: 1.9 10*3/uL (ref 0.7–4.0)
MCH: 25.5 pg — ABNORMAL LOW (ref 26.0–34.0)
MCHC: 31 g/dL (ref 30.0–36.0)
MCV: 82 fL (ref 80.0–100.0)
Monocytes Absolute: 0.5 10*3/uL (ref 0.1–1.0)
Monocytes Relative: 7 %
Neutro Abs: 4.4 10*3/uL (ref 1.7–7.7)
Neutrophils Relative %: 61 %
Platelets: 364 10*3/uL (ref 150–400)
RBC: 4.4 MIL/uL (ref 3.87–5.11)
RDW: 15.4 % (ref 11.5–15.5)
WBC: 7.3 10*3/uL (ref 4.0–10.5)
nRBC: 0 % (ref 0.0–0.2)

## 2020-09-19 LAB — POCT URINALYSIS DIPSTICK, ED / UC
Bilirubin Urine: NEGATIVE
Glucose, UA: NEGATIVE mg/dL
Nitrite: NEGATIVE
Protein, ur: NEGATIVE mg/dL
Specific Gravity, Urine: 1.025 (ref 1.005–1.030)
Urobilinogen, UA: 1 mg/dL (ref 0.0–1.0)
pH: 7 (ref 5.0–8.0)

## 2020-09-19 LAB — POC URINE PREG, ED: Preg Test, Ur: POSITIVE — AB

## 2020-09-19 NOTE — ED Provider Notes (Signed)
Emergency Medicine Provider Triage Evaluation Note  Tracy Foley , a 25 y.o. female  was evaluated in triage.  Pt complains of vaginal bleeding x3 weeks.  Patient had a miscarriage 3 weeks ago and has not started this, bleeding has continued since then and has not been improving.  Patient denies any feeling of lightheadedness or dizziness.  No history of clotting or bleeding disorder..  Review of Systems  Positive: Vaginal bleeding Negative: Abdominal pain, dizziness, lightheadedness  Physical Exam  There were no vitals taken for this visit. Gen:   Awake, no distress   Resp:  Normal effort  MSK:   Moves extremities without difficulty  Other:  Abdomen is soft, nontender to palpation  Medical Decision Making  Medically screening exam initiated at 3:06 PM.  Appropriate orders placed.  Tracy Foley was informed that the remainder of the evaluation will be completed by another provider, this initial triage assessment does not replace that evaluation, and the importance of remaining in the ED until their evaluation is complete.  Will check blood counts and beta-hCG.     Theron Arista, PA-C 09/19/20 1507    Wynetta Fines, MD 09/20/20 [phone]

## 2020-09-19 NOTE — ED Triage Notes (Signed)
Pt presents with vaginal bleeding xs 3 weeks. States had miscarriage 3-4 weeks ago.

## 2020-09-19 NOTE — ED Notes (Signed)
Patient is being discharged from the Urgent Care and sent to the Emergency Department via POV . Per Helaine Chess, PA, patient is in need of higher level of care due to Vaginal bleeding xs 3 week with positive pregnancy test. Patient is aware and verbalizes understanding of plan of care.  Vitals:   09/19/20 1116  BP: 109/74  Pulse: 92  Resp: 16  Temp: 98.4 F (36.9 C)  SpO2: 98%

## 2020-09-19 NOTE — ED Triage Notes (Signed)
Pt reports miscarriage 3 weeks ago and is still having heavy bleeding since, changing her pad at least once every hour, denies abd pain.

## 2020-09-19 NOTE — ED Notes (Signed)
Pt gave armband and stickers to [facility] staff. Pt gone at this time.

## 2020-09-19 NOTE — ED Triage Notes (Signed)
Patient given water

## 2020-09-19 NOTE — ED Notes (Signed)
Called [facility] to advise pt was coming to be evaluated.

## 2020-10-02 ENCOUNTER — Emergency Department (HOSPITAL_COMMUNITY)
Admission: EM | Admit: 2020-10-02 | Discharge: 2020-10-02 | Disposition: A | Discharge status: Home / Self Care | Payer: Medicaid Other | Attending: Emergency Medicine | Admitting: Emergency Medicine

## 2020-10-02 ENCOUNTER — Encounter (HOSPITAL_COMMUNITY): Payer: Self-pay | Admitting: Emergency Medicine

## 2020-10-02 ENCOUNTER — Emergency Department (HOSPITAL_COMMUNITY): Payer: Medicaid Other

## 2020-10-02 DIAGNOSIS — F10920 Alcohol use, unspecified with intoxication, uncomplicated: Secondary | ICD-10-CM | POA: Diagnosis not present

## 2020-10-02 DIAGNOSIS — R569 Unspecified convulsions: Secondary | ICD-10-CM | POA: Insufficient documentation

## 2020-10-02 DIAGNOSIS — F17210 Nicotine dependence, cigarettes, uncomplicated: Secondary | ICD-10-CM | POA: Diagnosis not present

## 2020-10-02 DIAGNOSIS — R4182 Altered mental status, unspecified: Secondary | ICD-10-CM | POA: Diagnosis not present

## 2020-10-02 DIAGNOSIS — Z20822 Contact with and (suspected) exposure to covid-19: Secondary | ICD-10-CM | POA: Diagnosis not present

## 2020-10-02 DIAGNOSIS — J45909 Unspecified asthma, uncomplicated: Secondary | ICD-10-CM | POA: Diagnosis not present

## 2020-10-02 DIAGNOSIS — Y906 Blood alcohol level of 120-199 mg/100 ml: Secondary | ICD-10-CM | POA: Insufficient documentation

## 2020-10-02 LAB — I-STAT CHEM 8, ED
BUN: 4 mg/dL — ABNORMAL LOW (ref 6–20)
Calcium, Ion: 1.19 mmol/L (ref 1.15–1.40)
Chloride: 107 mmol/L (ref 98–111)
Creatinine, Ser: 0.6 mg/dL (ref 0.44–1.00)
Glucose, Bld: 83 mg/dL (ref 70–99)
HCT: 37 % (ref 36.0–46.0)
Hemoglobin: 12.6 g/dL (ref 12.0–15.0)
Potassium: 3.6 mmol/L (ref 3.5–5.1)
Sodium: 145 mmol/L (ref 135–145)
TCO2: 26 mmol/L (ref 22–32)

## 2020-10-02 LAB — COMPREHENSIVE METABOLIC PANEL
ALT: 11 U/L (ref 0–44)
AST: 17 U/L (ref 15–41)
Albumin: 5 g/dL (ref 3.5–5.0)
Alkaline Phosphatase: 79 U/L (ref 38–126)
Anion gap: 8 (ref 5–15)
BUN: 7 mg/dL (ref 6–20)
CO2: 27 mmol/L (ref 22–32)
Calcium: 9.6 mg/dL (ref 8.9–10.3)
Chloride: 110 mmol/L (ref 98–111)
Creatinine, Ser: 0.48 mg/dL (ref 0.44–1.00)
GFR, Estimated: >60 mL/min (ref >60)
Glucose, Bld: 95 mg/dL (ref 70–99)
Potassium: 3.7 mmol/L (ref 3.5–5.1)
Sodium: 145 mmol/L (ref 135–145)
Total Bilirubin: 0.5 mg/dL (ref 0.3–1.2)
Total Protein: 9.1 g/dL — ABNORMAL HIGH (ref 6.5–8.1)

## 2020-10-02 LAB — RESP PANEL BY RT-PCR (FLU A&B, COVID) ARPGX2
Influenza A by PCR: NEGATIVE
Influenza B by PCR: NEGATIVE
SARS Coronavirus 2 by RT PCR: NEGATIVE

## 2020-10-02 LAB — CBC WITH DIFFERENTIAL/PLATELET
Abs Immature Granulocytes: 0.02 10*3/uL (ref 0.00–0.07)
Basophils Absolute: 0.1 10*3/uL (ref 0.0–0.1)
Basophils Relative: 1 %
Eosinophils Absolute: 0.3 10*3/uL (ref 0.0–0.5)
Eosinophils Relative: 4 %
HCT: 41 % (ref 36.0–46.0)
Hemoglobin: 12.3 g/dL (ref 12.0–15.0)
Immature Granulocytes: 0 %
Lymphocytes Relative: 24 %
Lymphs Abs: 2.1 10*3/uL (ref 0.7–4.0)
MCH: 24.9 pg — ABNORMAL LOW (ref 26.0–34.0)
MCHC: 30 g/dL (ref 30.0–36.0)
MCV: 83.2 fL (ref 80.0–100.0)
Monocytes Absolute: 0.5 10*3/uL (ref 0.1–1.0)
Monocytes Relative: 6 %
Neutro Abs: 5.7 10*3/uL (ref 1.7–7.7)
Neutrophils Relative %: 65 %
Platelets: 361 10*3/uL (ref 150–400)
RBC: 4.93 MIL/uL (ref 3.87–5.11)
RDW: 15.5 % (ref 11.5–15.5)
WBC: 8.7 10*3/uL (ref 4.0–10.5)
nRBC: 0 % (ref 0.0–0.2)

## 2020-10-02 LAB — SALICYLATE LEVEL: Salicylate Lvl: <7 mg/dL — ABNORMAL LOW (ref 7.0–30.0)

## 2020-10-02 LAB — MAGNESIUM: Magnesium: 2.2 mg/dL (ref 1.7–2.4)

## 2020-10-02 LAB — ETHANOL: Alcohol, Ethyl (B): 196 mg/dL — ABNORMAL HIGH (ref <10)

## 2020-10-02 LAB — ACETAMINOPHEN LEVEL: Acetaminophen (Tylenol), Serum: <10 ug/mL — ABNORMAL LOW (ref 10–30)

## 2020-10-02 MED ORDER — LEVETIRACETAM 750 MG PO TABS: 750.0000 mg | ORAL_TABLET | Freq: Two times a day (BID) | ORAL | 0 refills | Status: DC

## 2020-10-02 MED ORDER — LEVETIRACETAM IN NACL 1500 MG/100ML IV SOLN
1500.0000 mg | Freq: Once | INTRAVENOUS | Status: AC
  Administered 2020-10-02: 1500 mg via INTRAVENOUS
  Filled 2020-10-02: qty 100

## 2020-10-02 MED ORDER — AMMONIA AROMATIC IN INHA
RESPIRATORY_TRACT | Status: AC
  Filled 2020-10-02: qty 10

## 2020-10-02 MED ORDER — SODIUM CHLORIDE 0.9 % IV BOLUS
1000.0000 mL | Freq: Once | INTRAVENOUS | Status: AC
  Administered 2020-10-02: 1000 mL via INTRAVENOUS

## 2020-10-02 MED ORDER — LORAZEPAM 2 MG/ML IJ SOLN
2.0000 mg | Freq: Once | INTRAMUSCULAR | Status: AC
  Administered 2020-10-02: 2 mg via INTRAVENOUS

## 2020-10-02 MED ORDER — LORAZEPAM 2 MG/ML IJ SOLN
1.0000 mg | Freq: Once | INTRAMUSCULAR | Status: DC
  Filled 2020-10-02: qty 1

## 2020-10-02 NOTE — ED Provider Notes (Addendum)
Emergency Medicine Provider Triage Evaluation Note  Tracy Foley , a 25 y.o. female  was evaluated in triage.  Pt complains of seizure like activity per EMS however they denied seizure. Pt is not responding to verbal or painful stimuli at this time. Laying in triage chair staring off at the wall to her right. Symmetric chest ride. Apparently responded to ammonia.   Review of Systems  Positive: + seizure?  Negative: Unable to perform ROM as pt will not speak to me  Physical Exam  BP 133/88 (BP Location: Left Arm)   Pulse 84   Temp (!) 97.3 F (36.3 C) (Axillary)   Resp 18   SpO2 100%  Gen:   Awake, no distress   Resp:  Normal effort  MSK:   Moves extremities without difficulty  Other:  Moves arm away from face when dropped  Medical Decision Making  Medically screening exam initiated at 6:08 PM.  Appropriate orders placed.  Tracy Foley was informed that the remainder of the evaluation will be completed by another provider, this initial triage assessment does not replace that evaluation, and the importance of remaining in the ED until their evaluation is complete.  6:52 PM Mom at bedside - reports hx of seizures that started this summer while being pregnant? She is unsure if she takes medicine for seizures.   While in room pt clutching her chest and begins shaking - only upper extremity shaking. She was able to say "no" and retracked when IV team at bedside however not responding to any other stimuli. Ativan ordered IM as pt has hx of seizures and difficult to assess if these are true vs pseudoseizures. Pt to be taken to res A or B per stacy charge.     Tanda Rockers, PA-C 10/02/20 1853    Virgina Norfolk, DO 10/02/20 2353

## 2020-10-02 NOTE — ED Notes (Signed)
Pt Godmother is aware that she is up for discharged.

## 2020-10-02 NOTE — Discharge Instructions (Addendum)
Please take Keppra as prescribed  Do NOT drive until cleared by neurology   Avoid drinking alcohol  See neurology for follow-up  Return to ER if you have another seizure, lethargy
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
## 2020-10-02 NOTE — ED Notes (Signed)
Patient still nonverbal, eyes rolling-upper body tense-arms-control of upper extremities-

## 2020-10-02 NOTE — ED Notes (Signed)
Pts family member  Financial controller would like to be called for updates on pt's condition whenever RN is available. [phone]

## 2020-10-02 NOTE — ED Triage Notes (Signed)
Per EMS-states patient was at work when she stated she was going to have a SZ-EMS-states no seizure activity noted just rolling her eyes with some eye fluttering-patient is non responsive to verbal stimulation-appears to choose not to respond-reacted to ammonia

## 2020-10-12 ENCOUNTER — Emergency Department (HOSPITAL_COMMUNITY)
Admission: EM | Admit: 2020-10-12 | Discharge: 2020-10-13 | Disposition: A | Discharge status: Home / Self Care | Payer: Medicaid Other | Attending: Emergency Medicine | Admitting: Emergency Medicine

## 2020-10-12 DIAGNOSIS — F17210 Nicotine dependence, cigarettes, uncomplicated: Secondary | ICD-10-CM | POA: Insufficient documentation

## 2020-10-12 DIAGNOSIS — F10220 Alcohol dependence with intoxication, uncomplicated: Secondary | ICD-10-CM | POA: Diagnosis not present

## 2020-10-12 DIAGNOSIS — Y907 Blood alcohol level of 200-239 mg/100 ml: Secondary | ICD-10-CM | POA: Insufficient documentation

## 2020-10-12 DIAGNOSIS — Z7951 Long term (current) use of inhaled steroids: Secondary | ICD-10-CM | POA: Diagnosis not present

## 2020-10-12 DIAGNOSIS — R569 Unspecified convulsions: Secondary | ICD-10-CM | POA: Insufficient documentation

## 2020-10-12 DIAGNOSIS — J45909 Unspecified asthma, uncomplicated: Secondary | ICD-10-CM | POA: Insufficient documentation

## 2020-10-12 DIAGNOSIS — R55 Syncope and collapse: Secondary | ICD-10-CM | POA: Insufficient documentation

## 2020-10-12 DIAGNOSIS — Y990 Civilian activity done for income or pay: Secondary | ICD-10-CM | POA: Diagnosis not present

## 2020-10-12 DIAGNOSIS — F10920 Alcohol use, unspecified with intoxication, uncomplicated: Secondary | ICD-10-CM

## 2020-10-12 LAB — CBC WITH DIFFERENTIAL/PLATELET
Abs Immature Granulocytes: 0.02 10*3/uL (ref 0.00–0.07)
Basophils Absolute: 0.1 10*3/uL (ref 0.0–0.1)
Basophils Relative: 1 %
Eosinophils Absolute: 0.3 10*3/uL (ref 0.0–0.5)
Eosinophils Relative: 5 %
HCT: 41 % (ref 36.0–46.0)
Hemoglobin: 12.7 g/dL (ref 12.0–15.0)
Immature Granulocytes: 0 %
Lymphocytes Relative: 25 %
Lymphs Abs: 1.5 10*3/uL (ref 0.7–4.0)
MCH: 24.9 pg — ABNORMAL LOW (ref 26.0–34.0)
MCHC: 31 g/dL (ref 30.0–36.0)
MCV: 80.4 fL (ref 80.0–100.0)
Monocytes Absolute: 0.3 10*3/uL (ref 0.1–1.0)
Monocytes Relative: 5 %
Neutro Abs: 3.9 10*3/uL (ref 1.7–7.7)
Neutrophils Relative %: 64 %
Platelets: 386 10*3/uL (ref 150–400)
RBC: 5.1 MIL/uL (ref 3.87–5.11)
RDW: 15.5 % (ref 11.5–15.5)
WBC: 6.1 10*3/uL (ref 4.0–10.5)
nRBC: 0 % (ref 0.0–0.2)

## 2020-10-12 LAB — BASIC METABOLIC PANEL
Anion gap: 12 (ref 5–15)
BUN: 6 mg/dL (ref 6–20)
CO2: 22 mmol/L (ref 22–32)
Calcium: 9.3 mg/dL (ref 8.9–10.3)
Chloride: 107 mmol/L (ref 98–111)
Creatinine, Ser: 0.51 mg/dL (ref 0.44–1.00)
GFR, Estimated: >60 mL/min (ref >60)
Glucose, Bld: 96 mg/dL (ref 70–99)
Potassium: 4.2 mmol/L (ref 3.5–5.1)
Sodium: 141 mmol/L (ref 135–145)

## 2020-10-12 LAB — I-STAT BETA HCG BLOOD, ED (MC, WL, AP ONLY): I-stat hCG, quantitative: <5 m[IU]/mL (ref <5)

## 2020-10-12 LAB — CBG MONITORING, ED: Glucose-Capillary: 94 mg/dL (ref 70–99)

## 2020-10-12 LAB — ETHANOL: Alcohol, Ethyl (B): 217 mg/dL — ABNORMAL HIGH (ref <10)

## 2020-10-12 MED ORDER — SODIUM CHLORIDE 0.9 % IV SOLN
1000.0000 mL | INTRAVENOUS | Status: DC
  Administered 2020-10-12: 1000 mL via INTRAVENOUS

## 2020-10-12 MED ORDER — SODIUM CHLORIDE 0.9 % IV BOLUS (SEPSIS)
1000.0000 mL | Freq: Once | INTRAVENOUS | Status: AC
  Administered 2020-10-12: 1000 mL via INTRAVENOUS

## 2020-10-12 MED ORDER — LORAZEPAM 2 MG/ML IJ SOLN
1.0000 mg | Freq: Once | INTRAMUSCULAR | Status: AC
  Administered 2020-10-12: 1 mg via INTRAMUSCULAR
  Filled 2020-10-12: qty 1

## 2020-10-12 MED ORDER — SODIUM CHLORIDE 0.9 % IV BOLUS
1000.0000 mL | Freq: Once | INTRAVENOUS | Status: AC
  Administered 2020-10-12: 1000 mL via INTRAVENOUS

## 2020-10-12 NOTE — ED Triage Notes (Signed)
Pt brought into triage by charge nurse.  Pt brought in by family with ? Seizure activity.  Pt unresponsive at triage.  Does not respond to painful stimuli.  Taken to treatment room.

## 2020-10-12 NOTE — ED Notes (Signed)
NT attempted to get EKG on pt, however pt will not let NT touch pt. RN aware.
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
## 2020-10-12 NOTE — ED Notes (Signed)
Security at bedside for patient refusing blood draw. She remains tearful, but has given information for contacting parents, mother was called and stated she would be here in 30 minutes.

## 2020-10-12 NOTE — ED Notes (Signed)
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 Ativan given IM and lab at bedside for blood draw.

## 2020-10-12 NOTE — ED Notes (Signed)
Ray coworker [phone] requesting to speak to the patient

## 2020-10-13 MED ORDER — LEVETIRACETAM 750 MG PO TABS: 750.0000 mg | ORAL_TABLET | Freq: Two times a day (BID) | ORAL | 0 refills | Status: DC

## 2020-10-13 NOTE — ED Provider Notes (Signed)
I assumed care of this patient.  Please see previous provider note for further details of Hx, PE.  Briefly patient is a 25 y.o. female who presented for possible seizure-like activity.  Positive EtOH on board.  Has been out of her Keppra for 1 month.  Plan is to monitor and reassess.  12:56 AM Patient awake and alert x4. Patient's boyfriend at bedside.  The patient appears reasonably screened and/or stabilized for discharge and I doubt any other medical condition or other [facility] requiring further screening, evaluation, or treatment in the ED at this time prior to discharge. Safe for discharge with strict return precautions.  Disposition: Discharge  Condition: Good  I have discussed the results, Dx and Tx plan with the patient/family who expressed understanding and agree(s) with the plan. Discharge instructions discussed at length. The patient/family was given strict return precautions who verbalized understanding of the instructions. No further questions at time of discharge.    ED Discharge Orders          Ordered    levETIRAcetam (KEPPRA) 750 MG tablet  2 times daily        10/13/20 0055             Follow Up: Neurology           Nira Conn, MD 10/13/20 [phone]

## 2020-11-21 ENCOUNTER — Encounter (HOSPITAL_COMMUNITY): Payer: Self-pay | Admitting: Emergency Medicine

## 2020-11-21 ENCOUNTER — Emergency Department (HOSPITAL_COMMUNITY)
Admission: EM | Admit: 2020-11-21 | Discharge: 2020-11-21 | Disposition: A | Discharge status: Home / Self Care | Payer: Medicaid Other | Attending: Emergency Medicine | Admitting: Emergency Medicine

## 2020-11-21 ENCOUNTER — Other Ambulatory Visit: Payer: Self-pay

## 2020-11-21 DIAGNOSIS — G40909 Epilepsy, unspecified, not intractable, without status epilepticus: Secondary | ICD-10-CM | POA: Diagnosis present

## 2020-11-21 DIAGNOSIS — J45909 Unspecified asthma, uncomplicated: Secondary | ICD-10-CM | POA: Diagnosis not present

## 2020-11-21 DIAGNOSIS — Z79899 Other long term (current) drug therapy: Secondary | ICD-10-CM | POA: Diagnosis not present

## 2020-11-21 DIAGNOSIS — F17210 Nicotine dependence, cigarettes, uncomplicated: Secondary | ICD-10-CM | POA: Insufficient documentation

## 2020-11-21 LAB — CBG MONITORING, ED: Glucose-Capillary: 99 mg/dL (ref 70–99)

## 2020-11-21 MED ORDER — SODIUM CHLORIDE 0.9 % IV SOLN: INTRAVENOUS | Status: DC

## 2020-11-21 MED ORDER — LORAZEPAM 2 MG/ML IJ SOLN
1.0000 mg | Freq: Once | INTRAMUSCULAR | Status: DC
  Filled 2020-11-21: qty 1

## 2020-11-21 NOTE — ED Notes (Signed)
Patient refused to sign MSE and refused IV/labs

## 2020-11-21 NOTE — ED Triage Notes (Signed)
Patient BIBA d/t witnessed seizure, pt's boyfriend reports seizure "lasted 10 minutes". Upon EMS arrival pt had another seizure which lasted approx 1 min. VS WDL. Patient refused IV per EMS, no meds given.   BP 104/70 HR 72 RR 16 97% RA CBG 109

## 2020-11-21 NOTE — Discharge Instructions (Signed)
Return to the ED for evaluation if you change your mind.
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
## 2021-01-02 ENCOUNTER — Emergency Department (HOSPITAL_COMMUNITY): Payer: Medicaid Other

## 2021-01-02 ENCOUNTER — Emergency Department (HOSPITAL_COMMUNITY)
Admission: EM | Admit: 2021-01-02 | Discharge: 2021-01-03 | Disposition: A | Discharge status: Home / Self Care | Payer: Medicaid Other | Attending: Emergency Medicine | Admitting: Emergency Medicine

## 2021-01-02 DIAGNOSIS — F17210 Nicotine dependence, cigarettes, uncomplicated: Secondary | ICD-10-CM | POA: Insufficient documentation

## 2021-01-02 DIAGNOSIS — J45909 Unspecified asthma, uncomplicated: Secondary | ICD-10-CM | POA: Insufficient documentation

## 2021-01-02 DIAGNOSIS — G40911 Epilepsy, unspecified, intractable, with status epilepticus: Secondary | ICD-10-CM | POA: Diagnosis present

## 2021-01-02 DIAGNOSIS — Z79899 Other long term (current) drug therapy: Secondary | ICD-10-CM | POA: Insufficient documentation

## 2021-01-02 DIAGNOSIS — R251 Tremor, unspecified: Secondary | ICD-10-CM

## 2021-01-02 LAB — URINALYSIS, ROUTINE W REFLEX MICROSCOPIC
Bilirubin Urine: NEGATIVE
Glucose, UA: NEGATIVE mg/dL
Ketones, ur: 20 mg/dL — AB
Nitrite: POSITIVE — AB
Protein, ur: NEGATIVE mg/dL
Specific Gravity, Urine: 1.013 (ref 1.005–1.030)
pH: 5 (ref 5.0–8.0)

## 2021-01-02 LAB — COMPREHENSIVE METABOLIC PANEL
ALT: 11 U/L (ref 0–44)
AST: 25 U/L (ref 15–41)
Albumin: 4 g/dL (ref 3.5–5.0)
Alkaline Phosphatase: 59 U/L (ref 38–126)
Anion gap: 8 (ref 5–15)
BUN: 9 mg/dL (ref 6–20)
CO2: 20 mmol/L — ABNORMAL LOW (ref 22–32)
Calcium: 8.9 mg/dL (ref 8.9–10.3)
Chloride: 104 mmol/L (ref 98–111)
Creatinine, Ser: 0.57 mg/dL (ref 0.44–1.00)
GFR, Estimated: >60 mL/min (ref >60)
Glucose, Bld: 90 mg/dL (ref 70–99)
Potassium: 4 mmol/L (ref 3.5–5.1)
Sodium: 132 mmol/L — ABNORMAL LOW (ref 135–145)
Total Bilirubin: 0.6 mg/dL (ref 0.3–1.2)
Total Protein: 7.4 g/dL (ref 6.5–8.1)

## 2021-01-02 LAB — RAPID URINE DRUG SCREEN, HOSP PERFORMED
Amphetamines: NOT DETECTED
Barbiturates: NOT DETECTED
Benzodiazepines: NOT DETECTED
Cocaine: NOT DETECTED
Opiates: NOT DETECTED
Tetrahydrocannabinol: NOT DETECTED

## 2021-01-02 LAB — CBC WITH DIFFERENTIAL/PLATELET
Abs Immature Granulocytes: 0.03 10*3/uL (ref 0.00–0.07)
Basophils Absolute: 0.1 10*3/uL (ref 0.0–0.1)
Basophils Relative: 1 %
Eosinophils Absolute: 0.5 10*3/uL (ref 0.0–0.5)
Eosinophils Relative: 5 %
HCT: 38.8 % (ref 36.0–46.0)
Hemoglobin: 12.3 g/dL (ref 12.0–15.0)
Immature Granulocytes: 0 %
Lymphocytes Relative: 22 %
Lymphs Abs: 2.2 10*3/uL (ref 0.7–4.0)
MCH: 23.6 pg — ABNORMAL LOW (ref 26.0–34.0)
MCHC: 31.7 g/dL (ref 30.0–36.0)
MCV: 74.3 fL — ABNORMAL LOW (ref 80.0–100.0)
Monocytes Absolute: 0.7 10*3/uL (ref 0.1–1.0)
Monocytes Relative: 7 %
Neutro Abs: 6.6 10*3/uL (ref 1.7–7.7)
Neutrophils Relative %: 65 %
Platelets: 351 10*3/uL (ref 150–400)
RBC: 5.22 MIL/uL — ABNORMAL HIGH (ref 3.87–5.11)
RDW: 18 % — ABNORMAL HIGH (ref 11.5–15.5)
WBC: 9.9 10*3/uL (ref 4.0–10.5)
nRBC: 0 % (ref 0.0–0.2)

## 2021-01-02 LAB — ETHANOL: Alcohol, Ethyl (B): <10 mg/dL (ref <10)

## 2021-01-02 LAB — PREGNANCY, URINE: Preg Test, Ur: NEGATIVE

## 2021-01-02 MED ORDER — LEVETIRACETAM 500 MG PO TABS
1000.0000 mg | ORAL_TABLET | Freq: Once | ORAL | Status: AC
  Administered 2021-01-02: 23:00:00 1000 mg via ORAL
  Filled 2021-01-02: qty 2

## 2021-01-02 MED ORDER — LEVETIRACETAM 750 MG PO TABS: 750.0000 mg | ORAL_TABLET | Freq: Two times a day (BID) | ORAL | 0 refills | Status: DC

## 2021-01-02 NOTE — Discharge Instructions (Signed)
You have been seen and discharged from the emergency department.  Your blood work is normal, head CT was unremarkable.  It is possible he had seizure-like activity secondary to noncompliance with your Keppra.  You have been given a dose here in the department and a new prescription has been sent for you.  It is extremely important that you are compliant with this medication to avoid breakthrough seizure/seizure-like activity.  Follow-up with your primary provider for reevaluation and further care. Take home medications as prescribed. If you have any worsening symptoms or further concerns for your health please return to an emergency department for further evaluation.
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
## 2021-01-06 LAB — LEVETIRACETAM LEVEL: Levetiracetam Lvl: <2 ug/mL — ABNORMAL LOW (ref 10.0–40.0)

## 2021-01-25 ENCOUNTER — Other Ambulatory Visit: Payer: Self-pay

## 2021-01-25 ENCOUNTER — Ambulatory Visit (HOSPITAL_COMMUNITY)
Admission: EM | Admit: 2021-01-25 | Discharge: 2021-01-25 | Discharge status: Against Medical Advice | Payer: Medicaid Other

## 2021-01-25 NOTE — ED Triage Notes (Signed)
Pt called once in lobby and twice on phone with no answer.

## 2021-03-29 ENCOUNTER — Ambulatory Visit (HOSPITAL_COMMUNITY)
Admission: EM | Admit: 2021-03-29 | Discharge: 2021-03-29 | Disposition: A | Discharge status: Home / Self Care | Payer: Medicaid Other | Attending: Internal Medicine | Admitting: Internal Medicine

## 2021-03-29 ENCOUNTER — Encounter (HOSPITAL_COMMUNITY): Payer: Self-pay

## 2021-03-29 ENCOUNTER — Other Ambulatory Visit: Payer: Self-pay

## 2021-03-29 DIAGNOSIS — L509 Urticaria, unspecified: Secondary | ICD-10-CM | POA: Diagnosis not present

## 2021-03-29 DIAGNOSIS — R21 Rash and other nonspecific skin eruption: Secondary | ICD-10-CM

## 2021-03-29 MED ORDER — PREDNISONE 20 MG PO TABS: 20.0000 mg | ORAL_TABLET | Freq: Every day | ORAL | 0 refills | Status: AC

## 2021-03-29 NOTE — ED Triage Notes (Signed)
Pt presents with rash all over her body. She reports this started in December. She is also expecting,but does not know how many weeks.  ?

## 2021-03-29 NOTE — Discharge Instructions (Signed)
You are being treated with prednisone steroid to help alleviate rash.  Please follow-up with OB/GYN for further evaluation and management. ?
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
## 2021-04-22 ENCOUNTER — Ambulatory Visit: Payer: Medicaid Other | Admitting: Family Medicine

## 2021-07-02 ENCOUNTER — Encounter: Payer: Self-pay | Admitting: Family Medicine

## 2021-07-02 ENCOUNTER — Ambulatory Visit: Payer: Medicaid Other | Admitting: Family Medicine

## 2021-07-02 VITALS — BP 82/60 | HR 120 | Temp 97.7°F | Ht 59.0 in | Wt 111.0 lb

## 2021-07-02 DIAGNOSIS — Z8759 Personal history of other complications of pregnancy, childbirth and the puerperium: Secondary | ICD-10-CM

## 2021-07-02 DIAGNOSIS — O99354 Diseases of the nervous system complicating childbirth: Secondary | ICD-10-CM

## 2021-07-02 DIAGNOSIS — Z7689 Persons encountering health services in other specified circumstances: Secondary | ICD-10-CM

## 2021-07-02 DIAGNOSIS — R8290 Unspecified abnormal findings in urine: Secondary | ICD-10-CM | POA: Diagnosis not present

## 2021-07-02 DIAGNOSIS — O0933 Supervision of pregnancy with insufficient antenatal care, third trimester: Secondary | ICD-10-CM

## 2021-07-02 DIAGNOSIS — Z3A28 28 weeks gestation of pregnancy: Secondary | ICD-10-CM

## 2021-07-02 DIAGNOSIS — F32A Depression, unspecified: Secondary | ICD-10-CM

## 2021-07-02 DIAGNOSIS — R300 Dysuria: Secondary | ICD-10-CM | POA: Diagnosis not present

## 2021-07-02 DIAGNOSIS — G40909 Epilepsy, unspecified, not intractable, without status epilepticus: Secondary | ICD-10-CM

## 2021-07-02 LAB — POCT URINALYSIS DIPSTICK
Bilirubin, UA: NEGATIVE
Blood, UA: NEGATIVE
Glucose, UA: NEGATIVE
Ketones, UA: NEGATIVE
Leukocytes, UA: NEGATIVE
Nitrite, UA: NEGATIVE
Protein, UA: NEGATIVE
Spec Grav, UA: 1.015 (ref 1.010–1.025)
Urobilinogen, UA: 0.2 E.U./dL
pH, UA: 6 (ref 5.0–8.0)

## 2021-07-02 LAB — CBC WITH DIFFERENTIAL/PLATELET
Basophils Absolute: 0.1 10*3/uL (ref 0.0–0.1)
Basophils Relative: 0.5 % (ref 0.0–3.0)
Eosinophils Absolute: 0.3 10*3/uL (ref 0.0–0.7)
Eosinophils Relative: 2.4 % (ref 0.0–5.0)
HCT: 38.4 % (ref 36.0–46.0)
Hemoglobin: 12.2 g/dL (ref 12.0–15.0)
Lymphocytes Relative: 16.4 % (ref 12.0–46.0)
Lymphs Abs: 2 10*3/uL (ref 0.7–4.0)
MCHC: 31.8 g/dL (ref 30.0–36.0)
MCV: 83 fl (ref 78.0–100.0)
Monocytes Absolute: 0.7 10*3/uL (ref 0.1–1.0)
Monocytes Relative: 5.8 % (ref 3.0–12.0)
Neutro Abs: 9 10*3/uL — ABNORMAL HIGH (ref 1.4–7.7)
Neutrophils Relative %: 74.9 % (ref 43.0–77.0)
Platelets: 322 10*3/uL (ref 150.0–400.0)
RBC: 4.63 Mil/uL (ref 3.87–5.11)
RDW: 14.6 % (ref 11.5–15.5)
WBC: 12 10*3/uL — ABNORMAL HIGH (ref 4.0–10.5)

## 2021-07-02 LAB — COMPREHENSIVE METABOLIC PANEL
ALT: 10 U/L (ref 0–35)
AST: 15 U/L (ref 0–37)
Albumin: 3.9 g/dL (ref 3.5–5.2)
Alkaline Phosphatase: 103 U/L (ref 39–117)
BUN: 5 mg/dL — ABNORMAL LOW (ref 6–23)
CO2: 22 mEq/L (ref 19–32)
Calcium: 9.4 mg/dL (ref 8.4–10.5)
Chloride: 102 mEq/L (ref 96–112)
Creatinine, Ser: 0.45 mg/dL (ref 0.40–1.20)
GFR: 131.92 mL/min (ref >60.00)
Glucose, Bld: 64 mg/dL — ABNORMAL LOW (ref 70–99)
Potassium: 4 mEq/L (ref 3.5–5.1)
Sodium: 134 mEq/L — ABNORMAL LOW (ref 135–145)
Total Bilirubin: 0.3 mg/dL (ref 0.2–1.2)
Total Protein: 7.7 g/dL (ref 6.0–8.3)

## 2021-07-02 LAB — TSH: TSH: 1.78 u[IU]/mL (ref 0.35–5.50)

## 2021-07-02 NOTE — Patient Instructions (Addendum)
Please go downstairs for labs before you leave today.    You will hear from [facility] to schedule a visit.   Commonly Asked Questions During Pregnancy  Cats: A parasite can be excreted in cat feces.  To avoid exposure you need to have another person empty the little box.  If you must empty the litter box you will need to wear gloves.  Wash your hands after handling your cat.  This parasite can also be found in raw or undercooked meat so this should also be avoided.  Colds, Sore Throats, Flu: Please check your medication sheet to see what you can take for symptoms.  If your symptoms are unrelieved by these medications please call the office.  Dental Work: Most any dental work Agricultural consultant recommends is permitted.  X-rays should only be taken during the first trimester if absolutely necessary.  Your abdomen should be shielded with a lead apron during all x-rays.  Please notify your provider prior to receiving any x-rays.  Novocaine is fine; gas is not recommended.  If your dentist requires a note from [place] prior to dental work please call the office and we will provide one for you.  Exercise: Exercise is an important part of staying healthy during your pregnancy.  You may continue most exercises you were accustomed to prior to pregnancy.  Later in your pregnancy you will most likely notice you have difficulty with activities requiring balance like riding a bicycle.  It is important that you listen to your body and avoid activities that put you at a higher risk of falling.  Adequate rest and staying well hydrated are a must!  If you have questions about the safety of specific activities ask your provider.    Exposure to Children with illness: Try to avoid obvious exposure; report any symptoms to [place] when noted,  If you have chicken pos, red measles or mumps, you should be immune to these diseases.   Please do not take any vaccines while pregnant unless you have checked with your OB  provider.  Fetal Movement: After 28 weeks we recommend you do "kick counts" twice daily.  Lie or sit down in a calm quiet environment and count your baby movements "kicks".  You should feel your baby at least 10 times per hour.  If you have not felt 10 kicks within the first hour get up, walk around and have something sweet to eat or drink then repeat for an additional hour.  If count remains less than 10 per hour notify your provider.  Fumigating: Follow your pest control agent's advice as to how long to stay out of your home.  Ventilate the area well before re-entering.  Hemorrhoids:   Most over-the-counter preparations can be used during pregnancy.  Check your medication to see what is safe to use.  It is important to use a stool softener or fiber in your diet and to drink lots of liquids.  If hemorrhoids seem to be getting worse please call the office.   Hot Tubs:  Hot tubs Jacuzzis and saunas are not recommended while pregnant.  These increase your internal body temperature and should be avoided.  Intercourse:  Sexual intercourse is safe during pregnancy as long as you are comfortable, unless otherwise advised by your provider.  Spotting may occur after intercourse; report any bright red bleeding that is heavier than spotting.  Labor:  If you know that you are in labor, please go to the hospital.  If you are  unsure, please call the office and let us help you decide what to do.  Lifting, straining, etc:  If your job requires heavy lifting or straining please check with your provider for any limitations.  Generally, you should not lift items heavier than that you can lift simply with your hands and arms (no back muscles)  Painting:  Paint fumes do not harm your pregnancy, but may make you ill and should be avoided if possible.  Latex or water based paints have less odor than oils.  Use adequate ventilation while painting.  Permanents & Hair Color:  Chemicals in hair dyes are not recommended as  they cause increase hair dryness which can increase hair loss during pregnancy.  " Highlighting" and permanents are allowed.  Dye may be absorbed differently and permanents may not hold as well during pregnancy.  Sunbathing:  Use a sunscreen, as skin burns easily during pregnancy.  Drink plenty of fluids; avoid over heating.  Tanning Beds:  Because their possible side effects are still unknown, tanning beds are not recommended.  Ultrasound Scans:  Routine ultrasounds are performed at approximately 20 weeks.  You will be able to see your baby's general anatomy an if you would like to know the gender this can usually be determined as well.  If it is questionable when you conceived you may also receive an ultrasound early in your pregnancy for dating purposes.  Otherwise ultrasound exams are not routinely performed unless there is a medical necessity.  Although you can request a scan we ask that you pay for it when conducted because insurance does not cover " patient request" scans.  Work: If your pregnancy proceeds without complications you may work until your due date, unless your physician or employer advises otherwise.  Round Ligament Pain/Pelvic Discomfort:  Sharp, shooting pains not associated with bleeding are fairly common, usually occurring in the second trimester of pregnancy.  They tend to be worse when standing up or when you remain standing for long periods of time.  These are the result of pressure of certain pelvic ligaments called "round ligaments".  Rest, Tylenol and heat seem to be the most effective relief.  As the womb and fetus grow, they rise out of the pelvis and the discomfort improves.  Please notify the office if your pain seems different than that described.  It may represent a more serious condition.
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
## 2021-07-03 ENCOUNTER — Encounter: Payer: Self-pay | Admitting: Obstetrics and Gynecology

## 2021-07-03 ENCOUNTER — Encounter: Payer: Medicaid Other | Admitting: Obstetrics and Gynecology

## 2021-07-03 ENCOUNTER — Telehealth: Payer: Self-pay | Admitting: Family Medicine

## 2021-07-03 DIAGNOSIS — R8290 Unspecified abnormal findings in urine: Secondary | ICD-10-CM | POA: Insufficient documentation

## 2021-07-03 DIAGNOSIS — G40909 Epilepsy, unspecified, not intractable, without status epilepticus: Secondary | ICD-10-CM | POA: Insufficient documentation

## 2021-07-03 DIAGNOSIS — O0930 Supervision of pregnancy with insufficient antenatal care, unspecified trimester: Secondary | ICD-10-CM | POA: Insufficient documentation

## 2021-07-03 DIAGNOSIS — R300 Dysuria: Secondary | ICD-10-CM | POA: Insufficient documentation

## 2021-07-03 DIAGNOSIS — Z8759 Personal history of other complications of pregnancy, childbirth and the puerperium: Secondary | ICD-10-CM | POA: Insufficient documentation

## 2021-07-03 DIAGNOSIS — F32A Depression, unspecified: Secondary | ICD-10-CM | POA: Insufficient documentation

## 2021-07-03 DIAGNOSIS — O34219 Maternal care for unspecified type scar from previous cesarean delivery: Secondary | ICD-10-CM | POA: Insufficient documentation

## 2021-07-03 NOTE — Assessment & Plan Note (Signed)
No prenatal care in current pregnancy. Encouraged her to see OB as scheduled tomorrow.  

## 2021-07-03 NOTE — Telephone Encounter (Signed)
Our office received a message from [place] to get this patient scheduled, I made an appointment for her to come today at 9:55 to be seen, I tried several times yesterday and this morning to get in contact with the patient but was unable to reach her, we also sent a mychart message. I forwarded her Dr office a message through "IM" to let them know we had her scheduled but was unable to reach her.

## 2021-07-03 NOTE — Assessment & Plan Note (Signed)
UA negative. Discussed treating her with antibiotics. Declines for now. Referral to [facility]

## 2021-07-03 NOTE — Assessment & Plan Note (Signed)
No prenatal care in current pregnancy. Encouraged her to see OB as scheduled tomorrow.

## 2021-07-03 NOTE — Assessment & Plan Note (Signed)
Declines counseling or medication. She will let me know if she changes her mind. No SI

## 2021-07-03 NOTE — Assessment & Plan Note (Signed)
Referral to [facility] and neurology

## 2021-07-06 ENCOUNTER — Telehealth (HOSPITAL_BASED_OUTPATIENT_CLINIC_OR_DEPARTMENT_OTHER): Payer: Self-pay | Admitting: Family Medicine

## 2021-07-06 NOTE — Telephone Encounter (Signed)
Called patient and left a message to call the office back to schedule the appointment .  

## 2021-07-20 ENCOUNTER — Emergency Department (HOSPITAL_COMMUNITY): Payer: Medicaid Other

## 2021-07-20 ENCOUNTER — Other Ambulatory Visit: Payer: Self-pay

## 2021-07-20 ENCOUNTER — Encounter (HOSPITAL_COMMUNITY): Payer: Self-pay | Admitting: Emergency Medicine

## 2021-07-20 ENCOUNTER — Inpatient Hospital Stay (HOSPITAL_COMMUNITY)
Admission: EM | Admit: 2021-07-20 | Discharge: 2021-07-23 | DRG: 833 | Disposition: A | Discharge status: Home / Self Care | Payer: Medicaid Other | Attending: Obstetrics and Gynecology | Admitting: Obstetrics and Gynecology

## 2021-07-20 DIAGNOSIS — O365930 Maternal care for other known or suspected poor fetal growth, third trimester, not applicable or unspecified: Secondary | ICD-10-CM | POA: Diagnosis present

## 2021-07-20 DIAGNOSIS — R569 Unspecified convulsions: Secondary | ICD-10-CM | POA: Diagnosis present

## 2021-07-20 DIAGNOSIS — O0933 Supervision of pregnancy with insufficient antenatal care, third trimester: Secondary | ICD-10-CM | POA: Diagnosis not present

## 2021-07-20 DIAGNOSIS — Z91199 Patient's noncompliance with other medical treatment and regimen due to unspecified reason: Secondary | ICD-10-CM

## 2021-07-20 DIAGNOSIS — F17210 Nicotine dependence, cigarettes, uncomplicated: Secondary | ICD-10-CM | POA: Diagnosis present

## 2021-07-20 DIAGNOSIS — O99353 Diseases of the nervous system complicating pregnancy, third trimester: Secondary | ICD-10-CM | POA: Diagnosis present

## 2021-07-20 DIAGNOSIS — Z3493 Encounter for supervision of normal pregnancy, unspecified, third trimester: Secondary | ICD-10-CM

## 2021-07-20 DIAGNOSIS — Z5900 Homelessness unspecified: Secondary | ICD-10-CM

## 2021-07-20 DIAGNOSIS — O403XX0 Polyhydramnios, third trimester, not applicable or unspecified: Secondary | ICD-10-CM | POA: Diagnosis present

## 2021-07-20 DIAGNOSIS — O34219 Maternal care for unspecified type scar from previous cesarean delivery: Secondary | ICD-10-CM | POA: Diagnosis present

## 2021-07-20 DIAGNOSIS — Z3A29 29 weeks gestation of pregnancy: Secondary | ICD-10-CM | POA: Diagnosis not present

## 2021-07-20 DIAGNOSIS — G40909 Epilepsy, unspecified, not intractable, without status epilepticus: Secondary | ICD-10-CM | POA: Diagnosis not present

## 2021-07-20 DIAGNOSIS — O99333 Smoking (tobacco) complicating pregnancy, third trimester: Secondary | ICD-10-CM | POA: Diagnosis present

## 2021-07-20 DIAGNOSIS — Z3A32 32 weeks gestation of pregnancy: Secondary | ICD-10-CM

## 2021-07-20 LAB — CBC WITH DIFFERENTIAL/PLATELET
Abs Immature Granulocytes: 0.05 10*3/uL (ref 0.00–0.07)
Basophils Absolute: 0 10*3/uL (ref 0.0–0.1)
Basophils Relative: 0 %
Eosinophils Absolute: 0.2 10*3/uL (ref 0.0–0.5)
Eosinophils Relative: 2 %
HCT: 34.9 % — ABNORMAL LOW (ref 36.0–46.0)
Hemoglobin: 10.7 g/dL — ABNORMAL LOW (ref 12.0–15.0)
Immature Granulocytes: 1 %
Lymphocytes Relative: 14 %
Lymphs Abs: 1.3 10*3/uL (ref 0.7–4.0)
MCH: 25.7 pg — ABNORMAL LOW (ref 26.0–34.0)
MCHC: 30.7 g/dL (ref 30.0–36.0)
MCV: 83.7 fL (ref 80.0–100.0)
Monocytes Absolute: 0.2 10*3/uL (ref 0.1–1.0)
Monocytes Relative: 2 %
Neutro Abs: 7.2 10*3/uL (ref 1.7–7.7)
Neutrophils Relative %: 81 %
Platelets: 234 10*3/uL (ref 150–400)
RBC: 4.17 MIL/uL (ref 3.87–5.11)
RDW: 14.6 % (ref 11.5–15.5)
WBC: 9 10*3/uL (ref 4.0–10.5)
nRBC: 0 % (ref 0.0–0.2)

## 2021-07-20 LAB — COMPREHENSIVE METABOLIC PANEL
ALT: 12 U/L (ref 0–44)
AST: 19 U/L (ref 15–41)
Albumin: 2.7 g/dL — ABNORMAL LOW (ref 3.5–5.0)
Alkaline Phosphatase: 97 U/L (ref 38–126)
Anion gap: 9 (ref 5–15)
BUN: <5 mg/dL — ABNORMAL LOW (ref 6–20)
CO2: 16 mmol/L — ABNORMAL LOW (ref 22–32)
Calcium: 8.1 mg/dL — ABNORMAL LOW (ref 8.9–10.3)
Chloride: 107 mmol/L (ref 98–111)
Creatinine, Ser: 0.48 mg/dL (ref 0.44–1.00)
GFR, Estimated: >60 mL/min (ref >60)
Glucose, Bld: 250 mg/dL — ABNORMAL HIGH (ref 70–99)
Potassium: 3.5 mmol/L (ref 3.5–5.1)
Sodium: 132 mmol/L — ABNORMAL LOW (ref 135–145)
Total Bilirubin: 0.4 mg/dL (ref 0.3–1.2)
Total Protein: 6 g/dL — ABNORMAL LOW (ref 6.5–8.1)

## 2021-07-20 LAB — CBG MONITORING, ED: Glucose-Capillary: 57 mg/dL — ABNORMAL LOW (ref 70–99)

## 2021-07-20 LAB — ETHANOL: Alcohol, Ethyl (B): <10 mg/dL (ref <10)

## 2021-07-20 MED ORDER — FOLIC ACID 1 MG PO TABS
1.0000 mg | ORAL_TABLET | Freq: Every day | ORAL | Status: DC
  Administered 2021-07-20 – 2021-07-23 (×4): 1 mg via ORAL
  Filled 2021-07-20 (×4): qty 1

## 2021-07-20 MED ORDER — LORAZEPAM 2 MG/ML IJ SOLN
0.0000 mg | Freq: Four times a day (QID) | INTRAMUSCULAR | Status: DC
  Administered 2021-07-21: 1 mg via INTRAVENOUS
  Filled 2021-07-20: qty 1

## 2021-07-20 MED ORDER — LORAZEPAM 2 MG/ML IJ SOLN: 0.0000 mg | Freq: Two times a day (BID) | INTRAMUSCULAR | Status: DC

## 2021-07-20 MED ORDER — DEXTROSE 50 % IV SOLN
50.0000 mL | Freq: Once | INTRAVENOUS | Status: AC
Start: 2021-07-20 — End: 2021-07-20
  Administered 2021-07-20: 50 mL via INTRAVENOUS

## 2021-07-20 MED ORDER — LACTATED RINGERS IV BOLUS
1000.0000 mL | Freq: Once | INTRAVENOUS | Status: AC
Start: 2021-07-20 — End: 2021-07-20
  Administered 2021-07-20: 1000 mL via INTRAVENOUS

## 2021-07-20 MED ORDER — LORAZEPAM 2 MG/ML IJ SOLN: 1.0000 mg | INTRAMUSCULAR | Status: DC | PRN

## 2021-07-20 MED ORDER — THIAMINE HCL 100 MG PO TABS
100.0000 mg | ORAL_TABLET | Freq: Every day | ORAL | Status: DC
  Administered 2021-07-20 – 2021-07-23 (×4): 100 mg via ORAL
  Filled 2021-07-20 (×4): qty 1

## 2021-07-20 MED ORDER — LORAZEPAM 2 MG/ML IJ SOLN: 0.0000 mg | Freq: Four times a day (QID) | INTRAMUSCULAR | Status: DC

## 2021-07-20 MED ORDER — SODIUM CHLORIDE 0.9 % IV SOLN: INTRAVENOUS | Status: AC

## 2021-07-20 MED ORDER — THIAMINE HCL 100 MG/ML IJ SOLN
100.0000 mg | Freq: Every day | INTRAMUSCULAR | Status: DC
  Filled 2021-07-20 (×3): qty 1

## 2021-07-20 MED ORDER — ADULT MULTIVITAMIN W/MINERALS CH
1.0000 | ORAL_TABLET | Freq: Every day | ORAL | Status: DC
  Administered 2021-07-20: 1 via ORAL
  Filled 2021-07-20: qty 1

## 2021-07-20 MED ORDER — LORAZEPAM 1 MG PO TABS: 1.0000 mg | ORAL_TABLET | ORAL | Status: DC | PRN

## 2021-07-20 MED ORDER — LORAZEPAM 2 MG/ML IJ SOLN
1.0000 mg | Freq: Once | INTRAMUSCULAR | Status: AC
  Administered 2021-07-20: 1 mg via INTRAVENOUS
  Filled 2021-07-20: qty 1

## 2021-07-20 MED ORDER — DEXTROSE 50 % IV SOLN
INTRAVENOUS | Status: AC
  Filled 2021-07-20: qty 50

## 2021-07-20 NOTE — Progress Notes (Addendum)
Pt is a G4P3 at 29 4/[redacted] weeks gestation going by her LMP of 12/23/20. Pt was brought in by EMS because she had a seizure. Visitors with the pt say that she was sitting on the couch, became rigid, and slid down on the floor. Pt asked them to call an ambulance and then did not talk anymore.The visitor states that the pt ran out of her meds  today.  The pt has a hx of epilepsy and alcohol abuse. Pt has had dextrose for CBG in the 50's. She was given 5mg  of IM Versed by EMS. Pt had another seizure and was given 2.5mg  of versed IV. Pt is presently unresponsive. V/S are stable. Looking in her chart, I saw that she had a first time visit at [facility] with [facility] on 07/02/21.  The pt was supposed to be taking 750mg  of Kepra twice daily but had stopped taking her medication. The pt has a hx of preeclampsia with her first and third sons. She has a 5yo, a 4yo, and a 2yo. She has a hx of a C/S. According to the visitor, the children live with their father. 1754- I spoke with Dr. 07/04/21 about the pt. FHR 150 BPM. Absent variability, no accels, no decels and no uc's. I received orders for 1gm of Kepra IV. Her labs are not back yet. I sent him the pt's name and medical record number by secure chat. I told the ED RN, that Dr. Despina Hidden wanted the pt to have 1gm of Kepra IV. She said she would tell her attending Mikeal Hawthorne, MD.

## 2021-07-20 NOTE — Progress Notes (Signed)
Report given to Rod Mae RN

## 2021-07-20 NOTE — Progress Notes (Addendum)
Dr. Iver Nestle from Neuro at bedside for exam.

## 2021-07-20 NOTE — TOC CAGE-AID Note (Signed)
Transition of Care [facility]) - CAGE-AID Screening   Patient Details  Name: Tracy Foley MRN: [id number] Date of Birth: 06-27-1995  Transition of Care [facility]) CM/SW Contact:    [name], LCSWA Phone Number: 07/20/2021, 9:00 PM   Clinical Narrative: Pt participated in Cage-Aid.  Pt stated she does not use substance or ETOH.  Pt was not offered resources, due to no usage of substance or ETOH.     [name], MSW, LCSW-A Pronouns:  She/Her/Hers [facility]itions of Care Clinical Social Worker Direct Number:  [phone] [name]    CAGE-AID Screening:    Have You Ever Felt You Ought to Cut Down on Your Drinking or Drug Use?: No Have People Annoyed You By [employer] Your Drinking Or Drug Use?: No Have You Felt Bad Or Guilty About Your Drinking Or Drug Use?: No Have You Ever Had a Drink or Used Drugs First Thing In The Morning to Steady Your Nerves or to Get Rid of a Hangover?: No CAGE-AID Score: 0  Substance Abuse Education Offered: No

## 2021-07-20 NOTE — ED Provider Notes (Signed)
[facility] EMERGENCY DEPARTMENT Provider Note   CSN: [id number] Arrival date & time: 07/20/21  1708     History {Add pertinent medical, surgical, social history, OB history to HPI:1} Chief Complaint  Patient presents with   Seizures    Tracy Foley is a 26 y.o. female history of seizure but also about 7 months pregnant here presenting with possible seizure.  EMS states that patient had a witnessed seizure by bystanders.  Patient was given 5 mg of Versed IM.  She then had another seizure and was given 2.5 mg IV Versed by EMS.  Patient has a previous history of seizures.  She has been off her Keppra for several months now.  She was admitted about a year ago and had a negative EEG.  Patient was also seen multiple times previously for seizure-like activity  The history is provided by the patient and the EMS personnel.       Home Medications Prior to Admission medications   Medication Sig Start Date End Date Taking? Authorizing Provider  levETIRAcetam (KEPPRA) 750 MG tablet Take 1 tablet (750 mg total) by mouth 2 (two) times daily. Patient not taking: Reported on 07/02/2021 01/02/21   Horton, Clabe Seal, DO      Allergies    Patient has no known allergies.    Review of Systems   Review of Systems  Neurological:  Positive for seizures.  All other systems reviewed and are negative.   Physical Exam Updated Vital Signs BP 113/84   Pulse (!) 110   Temp 97.9 F (36.6 C) (Oral)   Resp (!) 30   Ht 4\' 11"  (1.499 m)   Wt 52.2 kg   LMP 12/04/2020 (Approximate) Comment: Pt is pregnant  SpO2 100%   BMI 23.23 kg/m  Physical Exam Vitals and nursing note reviewed.  Constitutional:      Comments: Eye deviation upward  HENT:     Head: Normocephalic.     Nose: Nose normal.     Mouth/Throat:     Mouth: Mucous membranes are moist.  Eyes:     Comments: Patient has eye deviation upward  Cardiovascular:     Rate and Rhythm: Normal rate and regular rhythm.      Pulses: Normal pulses.     Heart sounds: Normal heart sounds.  Pulmonary:     Effort: Pulmonary effort is normal.     Breath sounds: Normal breath sounds.  Abdominal:     General: Abdomen is flat.     Palpations: Abdomen is soft.     Comments: Patient has gravid uterus that is nontender  Musculoskeletal:        General: Normal range of motion.     Cervical back: Normal range of motion and neck supple.  Skin:    General: Skin is warm.     Capillary Refill: Capillary refill takes less than 2 seconds.  Neurological:     Comments: Eye deviation upward.  Patient has no, clonic seizure-like activity  Psychiatric:     Comments: Unable     ED Results / Procedures / Treatments   Labs (all labs ordered are listed, but only abnormal results are displayed) Labs Reviewed  CBC WITH DIFFERENTIAL/PLATELET - Abnormal; Notable for the following components:      Result Value   Hemoglobin 10.7 (*)    HCT 34.9 (*)    MCH 25.7 (*)    All other components within normal limits  CBG MONITORING, ED - Abnormal; Notable for the  following components:   Glucose-Capillary 57 (*)    All other components within normal limits  COMPREHENSIVE METABOLIC PANEL  ETHANOL    EKG None  Radiology No results found.  Procedures Procedures  {Document cardiac monitor, telemetry assessment procedure when appropriate:1}  Medications Ordered in ED Medications  lactated ringers bolus 1,000 mL (1,000 mLs Intravenous New Bag/Given 07/20/21 1740)  LORazepam (ATIVAN) injection 1 mg (1 mg Intravenous Given 07/20/21 1719)  dextrose 50 % solution 50 mL (50 mLs Intravenous Given 07/20/21 1721)    ED Course/ Medical Decision Making/ A&P                           Medical Decision Making Tracy Foley is a 26 y.o. female 7 months pregnant here presenting with seizure-like activity.  Unclear if with seizure versus pseudoseizure.  Patient was on Keppra previously.  She appears to be seizing on arrival.  Plan to give  Ativan and consult neurology  6:03 PM Dr. Iver Nestle was at bedside from neurology.  She recommends stat EEG  8:49 PM No obvious events on EEG. Will    Amount and/or Complexity of Data Reviewed Labs: ordered. ECG/medicine tests: ordered.  Risk OTC drugs. Prescription drug management. Decision regarding hospitalization.    Final Clinical Impression(s) / ED Diagnoses Final diagnoses:  None    Rx / DC Orders ED Discharge Orders     None

## 2021-07-20 NOTE — Progress Notes (Signed)
STAT EEG complete - results pending. ? ?

## 2021-07-20 NOTE — ED Triage Notes (Signed)
Per [facility] pt arrives responsive to verbal stimuli. Patient had witnessed seizure. Given 5mg  versed IM. Had another seizure en route and given 2.5mg  versed IV.

## 2021-07-20 NOTE — Progress Notes (Signed)
LTM EEG hooked up and running - no initial skin breakdown - push button tested - neuro notified.  

## 2021-07-20 NOTE — Progress Notes (Signed)
EEG tech here

## 2021-07-20 NOTE — Progress Notes (Addendum)
Spoke with Charlynne Pander, MD. He says that he, Dr. Despina Hidden, and the neurologist have been communicating by group chat  and have decided to hold off on the 1gm of IV Kepra for now.

## 2021-07-20 NOTE — ED Notes (Signed)
EEG at bedside.

## 2021-07-20 NOTE — ED Notes (Signed)
Pt to OB at this time with rapid OB RN and ER tech, report given to receiving RN by [facility] RN who has been at the bedside

## 2021-07-20 NOTE — ED Notes (Signed)
Neuro and rapid OB at bedside.

## 2021-07-20 NOTE — Procedures (Signed)
Patient Name: Tracy Foley  MRN: [id number]  Epilepsy Attending: Charlsie Quest  Referring Physician/Provider: Gordy Councilman, MD Date: 07/20/2021 Duration: 26.29 mins  Patient history: 26yo pregnant female presented with seizure like activity. EEG to evaluate for seizure  Level of alertness: Awake, asleep  AEDs during EEG study: Versed  Technical aspects: This EEG study was done with scalp electrodes positioned according to the 10-20 International system of electrode placement. Electrical activity was acquired at a sampl(ng rate of 500Hz  and reviewed with a high frequency filter of 70Hz  and a low frequency filter of 1Hz . EEG data were recorded continuously and digitally stored.   Description: No posterior dominant rhythm was seen. Sleep was characterized by sleep spindles 912-14hz ), maximal frontocentral region. There is an excessive amount of 15 to 18 Hz beta activity distributed symmetrically and diffusely.  Hyperventilation and photic stimulation were not performed.     ABNORMALITY - Excessive beta, generalized  IMPRESSION: This study is within normal limits. The excessive beta activity seen in the background is most likely due to the effect of benzodiazepine and is a benign EEG pattern. No seizures or epileptiform discharges were seen throughout the recording.   Dr was notified.   Tracy Foley 
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
## 2021-07-20 NOTE — Progress Notes (Signed)
LTM maint complete - no skin breakdown  Patient has been transported , all leads in tact, to the [facility]. [facility] monitored, Event button test confirmed by [facility].

## 2021-07-21 ENCOUNTER — Inpatient Hospital Stay (HOSPITAL_COMMUNITY): Payer: Medicaid Other

## 2021-07-21 DIAGNOSIS — Z3A29 29 weeks gestation of pregnancy: Secondary | ICD-10-CM

## 2021-07-21 LAB — CBC
HCT: 29.7 % — ABNORMAL LOW (ref 36.0–46.0)
Hemoglobin: 9.6 g/dL — ABNORMAL LOW (ref 12.0–15.0)
MCH: 26.7 pg (ref 26.0–34.0)
MCHC: 32.3 g/dL (ref 30.0–36.0)
MCV: 82.5 fL (ref 80.0–100.0)
Platelets: 246 10*3/uL (ref 150–400)
RBC: 3.6 MIL/uL — ABNORMAL LOW (ref 3.87–5.11)
RDW: 14.5 % (ref 11.5–15.5)
WBC: 9.5 10*3/uL (ref 4.0–10.5)
nRBC: 0 % (ref 0.0–0.2)

## 2021-07-21 LAB — DIFFERENTIAL
Abs Immature Granulocytes: 0.07 10*3/uL (ref 0.00–0.07)
Basophils Absolute: 0.1 10*3/uL (ref 0.0–0.1)
Basophils Relative: 1 %
Eosinophils Absolute: 0.3 10*3/uL (ref 0.0–0.5)
Eosinophils Relative: 4 %
Immature Granulocytes: 1 %
Lymphocytes Relative: 22 %
Lymphs Abs: 2.1 10*3/uL (ref 0.7–4.0)
Monocytes Absolute: 0.6 10*3/uL (ref 0.1–1.0)
Monocytes Relative: 6 %
Neutro Abs: 6.4 10*3/uL (ref 1.7–7.7)
Neutrophils Relative %: 66 %

## 2021-07-21 LAB — RAPID HIV SCREEN (HIV 1/2 AB+AG)
HIV 1/2 Antibodies: NONREACTIVE
HIV-1 P24 Antigen - HIV24: NONREACTIVE

## 2021-07-21 LAB — HEPATITIS B SURFACE ANTIGEN: Hepatitis B Surface Ag: NONREACTIVE

## 2021-07-21 LAB — RAPID URINE DRUG SCREEN, HOSP PERFORMED
Amphetamines: NOT DETECTED
Barbiturates: NOT DETECTED
Benzodiazepines: POSITIVE — AB
Cocaine: NOT DETECTED
Opiates: NOT DETECTED
Tetrahydrocannabinol: NOT DETECTED

## 2021-07-21 LAB — URINALYSIS, ROUTINE W REFLEX MICROSCOPIC
Bilirubin Urine: NEGATIVE
Glucose, UA: NEGATIVE mg/dL
Hgb urine dipstick: NEGATIVE
Ketones, ur: 20 mg/dL — AB
Leukocytes,Ua: NEGATIVE
Nitrite: NEGATIVE
Protein, ur: NEGATIVE mg/dL
Specific Gravity, Urine: 1.01 (ref 1.005–1.030)
pH: 6 (ref 5.0–8.0)

## 2021-07-21 LAB — TYPE AND SCREEN
ABO/RH(D): O POS
Antibody Screen: NEGATIVE

## 2021-07-21 LAB — RPR: RPR Ser Ql: NONREACTIVE

## 2021-07-21 LAB — HIV ANTIBODY (ROUTINE TESTING W REFLEX): HIV Screen 4th Generation wRfx: NONREACTIVE

## 2021-07-21 MED ORDER — ENOXAPARIN SODIUM 40 MG/0.4ML IJ SOSY
40.0000 mg | PREFILLED_SYRINGE | INTRAMUSCULAR | Status: DC
Start: 2021-07-21 — End: 2021-07-21

## 2021-07-21 MED ORDER — ACETAMINOPHEN 325 MG PO TABS
650.0000 mg | ORAL_TABLET | ORAL | Status: DC | PRN
  Administered 2021-07-22: 650 mg via ORAL
  Filled 2021-07-21: qty 2

## 2021-07-21 MED ORDER — ZOLPIDEM TARTRATE 5 MG PO TABS: 5.0000 mg | ORAL_TABLET | Freq: Every evening | ORAL | Status: DC | PRN

## 2021-07-21 MED ORDER — LORAZEPAM 2 MG/ML IJ SOLN: 2.0000 mg | Freq: Four times a day (QID) | INTRAMUSCULAR | Status: DC | PRN

## 2021-07-21 MED ORDER — DOCUSATE SODIUM 100 MG PO CAPS
100.0000 mg | ORAL_CAPSULE | Freq: Every day | ORAL | Status: DC
  Administered 2021-07-21: 100 mg via ORAL
  Filled 2021-07-21 (×2): qty 1

## 2021-07-21 MED ORDER — PRENATAL MULTIVITAMIN CH
1.0000 | ORAL_TABLET | Freq: Every day | ORAL | Status: DC
Start: 2021-07-21 — End: 2021-07-23
  Administered 2021-07-21 – 2021-07-22 (×2): 1 via ORAL
  Filled 2021-07-21 (×2): qty 1

## 2021-07-21 MED ORDER — CALCIUM CARBONATE ANTACID 500 MG PO CHEW
2.0000 | CHEWABLE_TABLET | ORAL | Status: DC | PRN
Start: 2021-07-21 — End: 2021-07-23

## 2021-07-21 NOTE — Procedures (Signed)
Patient Name: Tracy Foley  MRN: [id number]  Epilepsy Attending: Charlsie Quest  Referring Physician/Provider: Gordy Councilman, MD Duration: 07/20/2021 1955 to 07/21/2021 1955   Patient history: 26yo pregnant female presented with seizure like activity. EEG to evaluate for seizure   Level of alertness: Awake, asleep   AEDs during EEG study: Ativan   Technical aspects: This EEG study was done with scalp electrodes positioned according to the 10-20 International system of electrode placement. Electrical activity was acquired at a sampl(ng rate of 500Hz  and reviewed with a high frequency filter of 70Hz  and a low frequency filter of 1Hz . EEG data were recorded continuously and digitally stored.    Description: The posterior dominant rhythm consists of 10-11 Hz activity of moderate voltage (25-35 uV) seen predominantly in posterior head regions, symmetric and reactive to eye opening and eye closing. Sleep was characterized by sleep spindles 912-14hz ), maximal frontocentral region.  Hyperventilation and photic stimulation were not performed.      IMPRESSION: This study is within normal limits. No seizures or epileptiform discharges were seen throughout the recording.   Tracy Foley 

## 2021-07-21 NOTE — Progress Notes (Addendum)
Subjective: No seizures overnight.  States she was taking half her prescribed dose of Keppra for the first few months because she felt excessively drowsy on the full dose.  She also states that she has not been taking any medication for the last couple of months before because she ran out. Patient states that she has 1-2 seizures every day.  When asked more about the use of alcohol, patient states she had a little bit of drink once a few weeks ago when she went to a friend's baby shower.  Denies drinking alcohol.   Denies taking prenatal vitamins, unable to give me any reason why not.  Patient describes her seizures as whole body jerking with eye fluttering.  Denies urinary incontinence, tongue bite.  ROS: negative except above  Examination  Vital signs in last 24 hours: Temp:  [97.7 F (36.5 C)-98.3 F (36.8 C)] 98.2 F (36.8 C) (07/18 1230) Pulse Rate:  [79-122] 97 (07/18 1230) Resp:  [16-32] 16 (07/18 1230) BP: (91-114)/(61-84) 97/67 (07/18 1230) SpO2:  [97 %-100 %] 100 % (07/18 1230) Weight:  [52.2 kg] 52.2 kg (07/17 1744)  General: lying in bed, NAD CVS: pulse-normal rate and rhythm RS: breathing comfortably Extremities: normal   Neuro: MS: Alert, oriented, follows commands CN: pupils equal and reactive,  EOMI, face symmetric, tongue midline, normal sensation over face, Motor: 5/5 strength in all 4 extremities  Basic Metabolic Panel: Recent Labs  Lab 07/20/21 1740  NA 132*  K 3.5  CL 107  CO2 16*  GLUCOSE 250*  BUN <5*  CREATININE 0.48  CALCIUM 8.1*    CBC: Recent Labs  Lab 07/20/21 1740 07/21/21 0611  WBC 9.0 9.5  NEUTROABS 7.2 6.4  HGB 10.7* 9.6*  HCT 34.9* 29.7*  MCV 83.7 82.5  PLT 234 246     Coagulation Studies: No results for input(s): "LABPROT", "INR" in the last 72 hours.  Imaging No new brain imaging overnight  ASSESSMENT AND PLAN: 26 year old female with history of seizure-like activity on Keppra but noncompliant as well as [redacted] weeks  pregnant presented with seizure-like activity.  Seizure-like activity -LTM EEG overnight did not show any ictal-interictal activity  Recommendations -We will continue with LTM EEG for 1 more day to hopefully capture a typical event. -We will continue to hold off Keppra while in the hospital -Patient states she was taking Keppra although half the dose (prescribed dose of 750 twice daily).  Keppra is pregnancy category C.  Most risk would have been in the first few months of pregnancy during which patient was already taking Keppra.  Therefore, if we did not capture a typical event, I discussed the risks and benefits of taking Keppra versus not being on any medication.  Patient would prefer to take Keppra.  Although unlikely, patient can have both epileptic and nonepileptic events with a normal EEG.  Therefore, if EEG remains normal and we are not able to capture patient's typical event, I would recommend resuming Keppra at low-dose of  500 mg twice daily -Discussed seizure precautions including do not drive -Discussed seizure provoking factors including alcohol use, sleep deprivation, missing medications -As needed IV Ativan 2 mg for clinical seizure-like activity -Management of rest of comorbidities per primary team  I have spent a total of 55 minutes with the patient reviewing hospital notes,  test results, labs and examining the patient as well as establishing an assessment and plan that was discussed personally with the patient.  > 50% of time was spent in direct  patient care.     Lindie Spruce Epilepsy [facility] For questions after 5pm please refer to AMION to reach the Neurologist on call
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
## 2021-07-21 NOTE — Progress Notes (Signed)
LTM maint complete - no skin breakdown under: Fp1 Fp2 [facility] monitored, Event button test confirmed by [facility].  

## 2021-07-21 NOTE — Progress Notes (Signed)
vLTM maintenance   All impedances below 10kohms.  No skin breakdown noted at all skin sites 

## 2021-07-21 NOTE — Inpatient Diabetes Management (Signed)
Inpatient Diabetes Program Recommendations  AACE/ADA: New Consensus Statement on Inpatient Glycemic Control (2015)  Target Ranges:  Prepandial:   less than 140 mg/dL      Peak postprandial:   less than 180 mg/dL (1-2 hours)      Critically ill patients:  140 - 180 mg/dL   Lab Results  Component Value Date   GLUCAP 57 (L) 07/20/2021    Review of Glycemic Control  Latest Reference Range & Units 07/20/21 17:40  Glucose 70 - 99 mg/dL 736 (H)  (H): Data is abnormally high Diabetes history: ? Outpatient Diabetes medications: none Current orders for Inpatient glycemic control: none  Inpatient Diabetes Program Recommendations:    Given lack of prenatal care and admitting glucose serum of 250 mg/dL may want to add CBGs TID & HS.   Thanks, Lujean Rave, MSN, RNC-OB Diabetes Coordinator [phone] (8a-5p)

## 2021-07-22 ENCOUNTER — Encounter (HOSPITAL_COMMUNITY): Payer: Self-pay | Admitting: Obstetrics & Gynecology

## 2021-07-22 LAB — BASIC METABOLIC PANEL
Anion gap: 11 (ref 5–15)
BUN: <5 mg/dL — ABNORMAL LOW (ref 6–20)
CO2: 20 mmol/L — ABNORMAL LOW (ref 22–32)
Calcium: 8.9 mg/dL (ref 8.9–10.3)
Chloride: 109 mmol/L (ref 98–111)
Creatinine, Ser: 0.42 mg/dL — ABNORMAL LOW (ref 0.44–1.00)
GFR, Estimated: >60 mL/min (ref >60)
Glucose, Bld: 81 mg/dL (ref 70–99)
Potassium: 3.7 mmol/L (ref 3.5–5.1)
Sodium: 140 mmol/L (ref 135–145)

## 2021-07-22 LAB — RUBELLA SCREEN: Rubella: 1.18 index (ref >0.99)

## 2021-07-22 LAB — GLUCOSE, CAPILLARY: Glucose-Capillary: 112 mg/dL — ABNORMAL HIGH (ref 70–99)

## 2021-07-22 MED ORDER — LEVETIRACETAM 500 MG PO TABS
500.0000 mg | ORAL_TABLET | Freq: Two times a day (BID) | ORAL | Status: DC
  Administered 2021-07-22 – 2021-07-23 (×3): 500 mg via ORAL
  Filled 2021-07-22 (×4): qty 1

## 2021-07-22 NOTE — Progress Notes (Signed)
Patient ID: Anneka Studer, female   DOB: Oct 19, 1995, 26 y.o.   MRN: [id number] [facility] ANTEPARTUM COMPREHENSIVE PROGRESS NOTE  Analyssa Downs is a 25 y.o. G1P0000 at [redacted]w[redacted]d  who is admitted for non epileptic Sz activity.    Fetal presentation is cephalic. Length of Stay:  2  Days  Subjective: Pt has no complaints today. Denies any Sz activity Patient reports good fetal movement.  She reports no uterine contractions, no bleeding and no loss of fluid per vagina.  Vitals:  Blood pressure 95/66, pulse 89, temperature 98.3 F (36.8 C), temperature source Oral, resp. rate 16, height 4\' 11"  (1.499 m), weight 52.2 kg, last menstrual period 12/04/2020, SpO2 100 %.  Physical Examination: Lungs clear Heart RRR Abd soft + BS gravid Ext non tender  Fetal Monitoring:  Baseline: 140-150's bpm, Variability: Good {> 6 bpm), Accelerations: Reactive, and Decelerations: Absent  Labs:  Results for orders placed or performed during the hospital encounter of 07/20/21 (from the past 24 hour(s))  Basic metabolic panel   Collection Time: 07/22/21  5:40 AM  Result Value Ref Range   Sodium 140 135 - 145 mmol/L   Potassium 3.7 3.5 - 5.1 mmol/L   Chloride 109 98 - 111 mmol/L   CO2 20 (L) 22 - 32 mmol/L   Glucose, Bld 81 70 - 99 mg/dL   BUN <5 (L) 6 - 20 mg/dL   Creatinine, Ser 07/24/21 (L) 0.44 - 1.00 mg/dL   Calcium 8.9 8.9 - 8.31 mg/dL   GFR, Estimated 51.7 >61 mL/min   Anion gap 11 5 - 15    Imaging Studies:    U/S yesterday   Medications:  Scheduled  docusate sodium  100 mg Oral Daily   folic acid  1 mg Oral Daily   levETIRAcetam  500 mg Oral BID   prenatal multivitamin  1 tablet Oral Q1200   thiamine  100 mg Oral Daily   Or   thiamine  100 mg Intravenous Daily   I have reviewed the patient's current medications.  ASSESSMENT: IUP 32 6/7 weeks No prenatal care Non epileptic Sz d/o H/O C section H/O VBAC H/O PEC Polyhydramnios IUGR < 5 % Homeless Consider adoption    PLAN: W/U for Sz negative per neurology. Pt desires to continue with Keppra but at reduced dose as per neurologist recommendation. Appreciate neurology assistance in Ms Saville's care. Appreciate SW assistance as well in trying to locate housing for pt.  Will hold on discharge for now until notified by SW. Adoption as per SW and pt.  Fetal well being reassuring, continue with q shift NST and start weekly BPP's Will monitor CBG's due to polyhydramnios.  Continue routine antenatal care.   >60 07/22/2021,2:24 PM

## 2021-07-22 NOTE — Procedures (Addendum)
Patient Name: Tracy Foley  MRN: [id number]  Epilepsy Attending: Charlsie Quest  Referring Physician/Provider: Gordy Councilman, MD Duration: 07/21/2021 1955 to 07/22/2021 4097   Patient history: 26yo pregnant female presented with seizure like activity. EEG to evaluate for seizure   Level of alertness: Awake, asleep   AEDs during EEG study: None  Technical aspects: This EEG study was done with scalp electrodes positioned according to the 10-20 International system of electrode placement. Electrical activity was acquired at a sampl(ng rate of 500Hz  and reviewed with a high frequency filter of 70Hz  and a low frequency filter of 1Hz . EEG data were recorded continuously and digitally stored.    Description: The posterior dominant rhythm consists of 10-11 Hz activity of moderate voltage (25-35 uV) seen predominantly in posterior head regions, symmetric and reactive to eye opening and eye closing. Sleep was characterized by sleep spindles 912-14hz ), maximal frontocentral region.  Hyperventilation and photic stimulation were not performed.      IMPRESSION: This study is within normal limits. No seizures or epileptiform discharges were seen throughout the recording.   Tracy Foley 

## 2021-07-22 NOTE — Progress Notes (Signed)
vLTM discontinued  [facility] notified.  No skin breakdown at all skin sites noted ?

## 2021-07-22 NOTE — Progress Notes (Addendum)
Subjective: NAEO. No typical seizure like events. Denies any concerns.  ROS: negative except above  Examination  Vital signs in last 24 hours: Temp:  [98.1 F (36.7 C)-98.4 F (36.9 C)] 98.3 F (36.8 C) (07/19 1153) Pulse Rate:  [72-102] 89 (07/19 1153) Resp:  [16-18] 16 (07/19 1153) BP: (91-105)/(59-81) 95/66 (07/19 1153) SpO2:  [99 %-100 %] 100 % (07/19 1153)  General: lying in bed, NAD CVS: pulse-normal rate and rhythm RS: breathing comfortably Extremities: normal    Neuro: MS: Alert, oriented, follows commands CN: pupils equal and reactive,  EOMI, face symmetric, tongue midline, normal sensation over face, Motor: 5/5 strength in all 4 extremities  Basic Metabolic Panel: Recent Labs  Lab 07/20/21 1740 07/22/21 0540  NA 132* 140  K 3.5 3.7  CL 107 109  CO2 16* 20*  GLUCOSE 250* 81  BUN <5* <5*  CREATININE 0.48 0.42*  CALCIUM 8.1* 8.9    CBC: Recent Labs  Lab 07/20/21 1740 07/21/21 0611  WBC 9.0 9.5  NEUTROABS 7.2 6.4  HGB 10.7* 9.6*  HCT 34.9* 29.7*  MCV 83.7 82.5  PLT 234 246     Coagulation Studies: No results for input(s): "LABPROT", "INR" in the last 72 hours.  Imaging No new brain imaging overnight   ASSESSMENT AND PLAN: 26 year old female with history of seizure-like activity on Keppra but noncompliant as well as [redacted] weeks pregnant presented with seizure-like activity.   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 She also reports taking it in the first 3-4 months of pregnancy. Therefore, after discussing the risks and benefits of being on keppra  (Pregnancy cat C), we agree that patient would benefit form being on keppra 500mg  BID. -After patient delivers, she will benefit from  EMU monitoring. -Discussed seizure precautions including do not drive -Discussed seizure provoking factors including alcohol use, sleep deprivation, missing medications -F/u with neuro in 2- 4 weeks  -Management of rest of comorbidities per primary team   I have spent a total of 25 minutes with the patient reviewing hospital notes,  test results, labs and examining the patient as well as establishing an assessment and plan that was discussed personally with the patient.  > 50% of time was spent in direct patient care.   Ep[facility]psy [facility] For questions after 5pm please refer to AMION to reach the Neurologist on call

## 2021-07-22 NOTE — Progress Notes (Signed)
vLTM maintenance all impedances below 10kohms.  No skin breakdown noted at all skin sites 
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
## 2021-07-22 NOTE — Progress Notes (Signed)
CSW followed up with patient at bedside to inquire about updates on discharge plan. Patient reported that she does not have a discharge plan at this time. CSW asked if patient called any of the resources provided, patient reported no and that she has been tired. CSW encouraged patient to follow up with resources provided if she does not feel that she will have a safe place to go at discharge. CSW emphasized the importance of following up. CSW inquired about any current needs, patient reported none. CSW agreed to follow up with patient tomorrow.   Celso Sickle, LCSW Clinical Social Worker [facility] Cell#: [phone]

## 2021-07-23 ENCOUNTER — Inpatient Hospital Stay (HOSPITAL_COMMUNITY): Payer: Medicaid Other

## 2021-07-23 ENCOUNTER — Other Ambulatory Visit: Payer: Self-pay | Admitting: Obstetrics and Gynecology

## 2021-07-23 ENCOUNTER — Inpatient Hospital Stay (HOSPITAL_BASED_OUTPATIENT_CLINIC_OR_DEPARTMENT_OTHER): Payer: Medicaid Other

## 2021-07-23 DIAGNOSIS — O99333 Smoking (tobacco) complicating pregnancy, third trimester: Secondary | ICD-10-CM

## 2021-07-23 DIAGNOSIS — O403XX0 Polyhydramnios, third trimester, not applicable or unspecified: Secondary | ICD-10-CM

## 2021-07-23 DIAGNOSIS — O99353 Diseases of the nervous system complicating pregnancy, third trimester: Secondary | ICD-10-CM

## 2021-07-23 DIAGNOSIS — Z3A33 33 weeks gestation of pregnancy: Secondary | ICD-10-CM

## 2021-07-23 DIAGNOSIS — F17210 Nicotine dependence, cigarettes, uncomplicated: Secondary | ICD-10-CM

## 2021-07-23 DIAGNOSIS — O365930 Maternal care for other known or suspected poor fetal growth, third trimester, not applicable or unspecified: Secondary | ICD-10-CM

## 2021-07-23 DIAGNOSIS — O0933 Supervision of pregnancy with insufficient antenatal care, third trimester: Secondary | ICD-10-CM

## 2021-07-23 DIAGNOSIS — O09293 Supervision of pregnancy with other poor reproductive or obstetric history, third trimester: Secondary | ICD-10-CM

## 2021-07-23 DIAGNOSIS — G40909 Epilepsy, unspecified, not intractable, without status epilepticus: Secondary | ICD-10-CM | POA: Diagnosis not present

## 2021-07-23 LAB — GLUCOSE, CAPILLARY
Glucose-Capillary: 84 mg/dL (ref 70–99)
Glucose-Capillary: 91 mg/dL (ref 70–99)

## 2021-07-23 MED ORDER — LEVETIRACETAM 500 MG PO TABS: 500.0000 mg | ORAL_TABLET | Freq: Two times a day (BID) | ORAL | 3 refills | Status: DC

## 2021-07-23 MED ORDER — PRENATAL MULTIVITAMIN CH: 1.0000 | ORAL_TABLET | Freq: Every day | ORAL | 11 refills | Status: DC

## 2021-07-23 NOTE — Progress Notes (Signed)
Discharge instructions and prescriptions given to pt. Discussed signs and symptoms to report to the MD, upcoming appointments, and meds. Pt verbalizes understanding and has no questions or concerns at this time. Pt discharged home from hospital in stable condition. 

## 2021-07-23 NOTE — Discharge Summary (Signed)
Patient ID: Tracy Foley MRN: [id number] DOB/AGE: 07/24/1995 26 y.o.  Admit date: 07/20/2021 Discharge date: 07/23/2021  Admission Diagnoses: IUP 32 weeks, no prenatal care, non epileptic seizures  Discharge Diagnoses: SAA, IUGR and polyhydramnios  Prenatal Procedures: NST and ultrasound   Hospital Course:  This is a 26 y.o. G4P0003 with IUP at [redacted]w[redacted]d admitted for non epileptic sz activity. Neurology was consulted and no had several normal EEG's. Pt was previously taking Keppera, pt elected to remain on Keppera at a reduced dosage. OB U/S was completed due to no prenatal care, IUGR and polyhydramnios was identified. She had unremarkable UA dopplers and 8/8 BPP on day of discharge. SW was consulted due to no prenatal care and pt was noted to have problems with stable living arrangements. See SW notes for additional information. Pt reassured SW and MD that she had a safe to stay at time of discharge and housing information was provided to pt.  Pt progressed to ambulating, voiding & tolerating diet. Fetal well being remained reassuring during hospitalization.  She was deemed stable for discharge to home with outpatient follow up. Importance of prenatal care reviewed with pt.  Discharge Exam: Temp:  [97.6 F (36.4 C)-98.6 F (37 C)] 97.8 F (36.6 C) (07/20 0751) Pulse Rate:  [73-95] 83 (07/20 0751) Resp:  [16-18] 18 (07/20 0751) BP: (94-102)/(63-72) 94/72 (07/20 0751) SpO2:  [97 %-99 %] 98 % (07/20 0751)  Physical Examination: Lungs clear Heart RRR Abd soft + BS gravid Ext non tender  Fetal monitoring: FHR: 130 bpm, Variability: moderate, Accelerations: Present, Decelerations: Absent  Uterine activity: no contractions per hour  Significant Diagnostic Studies:  Results for orders placed or performed during the hospital encounter of 07/20/21 (from the past 168 hour(s))  CBG monitoring, ED   Collection Time: 07/20/21  5:16 PM  Result Value Ref Range   Glucose-Capillary 57 (L)  70 - 99 mg/dL  CBC with Differential   Collection Time: 07/20/21  5:40 PM  Result Value Ref Range   WBC 9.0 4.0 - 10.5 K/uL   RBC 4.17 3.87 - 5.11 MIL/uL   Hemoglobin 10.7 (L) 12.0 - 15.0 g/dL   HCT 08.6 (L) 76.1 - 95.0 %   MCV 83.7 80.0 - 100.0 fL   MCH 25.7 (L) 26.0 - 34.0 pg   MCHC 30.7 30.0 - 36.0 g/dL   RDW 93.2 67.1 - 24.5 %   Platelets 234 150 - 400 K/uL   nRBC 0.0 0.0 - 0.2 %   Neutrophils Relative % 81 %   Neutro Abs 7.2 1.7 - 7.7 K/uL   Lymphocytes Relative 14 %   Lymphs Abs 1.3 0.7 - 4.0 K/uL   Monocytes Relative 2 %   Monocytes Absolute 0.2 0.1 - 1.0 K/uL   Eosinophils Relative 2 %   Eosinophils Absolute 0.2 0.0 - 0.5 K/uL   Basophils Relative 0 %   Basophils Absolute 0.0 0.0 - 0.1 K/uL   Immature Granulocytes 1 %   Abs Immature Granulocytes 0.05 0.00 - 0.07 K/uL  Comprehensive metabolic panel   Collection Time: 07/20/21  5:40 PM  Result Value Ref Range   Sodium 132 (L) 135 - 145 mmol/L   Potassium 3.5 3.5 - 5.1 mmol/L   Chloride 107 98 - 111 mmol/L   CO2 16 (L) 22 - 32 mmol/L   Glucose, Bld 250 (H) 70 - 99 mg/dL   BUN <5 (L) 6 - 20 mg/dL   Creatinine, Ser 8.09 0.44 - 1.00 mg/dL   Calcium 8.1 (  L) 8.9 - 10.3 mg/dL   Total Protein 6.0 (L) 6.5 - 8.1 g/dL   Albumin 2.7 (L) 3.5 - 5.0 g/dL   AST 19 15 - 41 U/L   ALT 12 0 - 44 U/L   Alkaline Phosphatase 97 38 - 126 U/L   Total Bilirubin 0.4 0.3 - 1.2 mg/dL   GFR, Estimated >60 >60 mL/min   Anion gap 9 5 - 15  Ethanol   Collection Time: 07/20/21  5:40 PM  Result Value Ref Range   Alcohol, Ethyl (B) <10 <10 mg/dL  Hepatitis B surface antigen   Collection Time: 07/21/21  6:11 AM  Result Value Ref Range   Hepatitis B Surface Ag NON REACTIVE NON REACTIVE  Rubella screen   Collection Time: 07/21/21  6:11 AM  Result Value Ref Range   Rubella 1.18 Immune >0.99 index  RPR   Collection Time: 07/21/21  6:11 AM  Result Value Ref Range   RPR Ser Ql NON REACTIVE NON REACTIVE  CBC   Collection Time: 07/21/21  6:11  AM  Result Value Ref Range   WBC 9.5 4.0 - 10.5 K/uL   RBC 3.60 (L) 3.87 - 5.11 MIL/uL   Hemoglobin 9.6 (L) 12.0 - 15.0 g/dL   HCT 29.7 (L) 36.0 - 46.0 %   MCV 82.5 80.0 - 100.0 fL   MCH 26.7 26.0 - 34.0 pg   MCHC 32.3 30.0 - 36.0 g/dL   RDW 14.5 11.5 - 15.5 %   Platelets 246 150 - 400 K/uL   nRBC 0.0 0.0 - 0.2 %  Differential   Collection Time: 07/21/21  6:11 AM  Result Value Ref Range   Neutrophils Relative % 66 %   Neutro Abs 6.4 1.7 - 7.7 K/uL   Lymphocytes Relative 22 %   Lymphs Abs 2.1 0.7 - 4.0 K/uL   Monocytes Relative 6 %   Monocytes Absolute 0.6 0.1 - 1.0 K/uL   Eosinophils Relative 4 %   Eosinophils Absolute 0.3 0.0 - 0.5 K/uL   Basophils Relative 1 %   Basophils Absolute 0.1 0.0 - 0.1 K/uL   Immature Granulocytes 1 %   Abs Immature Granulocytes 0.07 0.00 - 0.07 K/uL  HIV Antibody (routine testing w rflx)   Collection Time: 07/21/21  6:11 AM  Result Value Ref Range   HIV Screen 4th Generation wRfx Non Reactive Non Reactive  Rapid HIV screen (HIV 1/2 Ab+Ag)   Collection Time: 07/21/21  6:11 AM  Result Value Ref Range   HIV-1 P24 Antigen - HIV24 NON REACTIVE NON REACTIVE   HIV 1/2 Antibodies NON REACTIVE NON REACTIVE   Interpretation (HIV Ag Ab)      A non reactive test result means that HIV 1 or HIV 2 antibodies and HIV 1 p24 antigen were not detected in the specimen.  Type and screen [place]   Collection Time: 07/21/21  6:11 AM  Result Value Ref Range   ABO/RH(D) O POS    Antibody Screen NEG    Sample Expiration      07/24/2021,2359 Performed at [facility], [place] [address] [place], [place] [address]   Urinalysis, Routine w reflex microscopic   Collection Time: 07/21/21  1:37 PM  Result Value Ref Range   Color, Urine YELLOW YELLOW   APPearance HAZY (A) CLEAR   Specific Gravity, Urine 1.010 1.005 - 1.030   pH 6.0 5.0 - 8.0   Glucose, UA NEGATIVE NEGATIVE mg/dL   Hgb urine dipstick NEGATIVE  NEGATIVE   Bilirubin Urine  NEGATIVE NEGATIVE   Ketones, ur 20 (A) NEGATIVE mg/dL   Protein, ur NEGATIVE NEGATIVE mg/dL   Nitrite NEGATIVE NEGATIVE   Leukocytes,Ua NEGATIVE NEGATIVE  Rapid urine drug screen (hospital performed)   Collection Time: 07/21/21  1:37 PM  Result Value Ref Range   Opiates NONE DETECTED NONE DETECTED   Cocaine NONE DETECTED NONE DETECTED   Benzodiazepines POSITIVE (A) NONE DETECTED   Amphetamines NONE DETECTED NONE DETECTED   Tetrahydrocannabinol NONE DETECTED NONE DETECTED   Barbiturates NONE DETECTED NONE DETECTED  Basic metabolic panel   Collection Time: 07/22/21  5:40 AM  Result Value Ref Range   Sodium 140 135 - 145 mmol/L   Potassium 3.7 3.5 - 5.1 mmol/L   Chloride 109 98 - 111 mmol/L   CO2 20 (L) 22 - 32 mmol/L   Glucose, Bld 81 70 - 99 mg/dL   BUN <5 (L) 6 - 20 mg/dL   Creatinine, Ser 8.18 (L) 0.44 - 1.00 mg/dL   Calcium 8.9 8.9 - 56.3 mg/dL   GFR, Estimated >14 >97 mL/min   Anion gap 11 5 - 15  Glucose, capillary   Collection Time: 07/22/21  7:44 PM  Result Value Ref Range   Glucose-Capillary 112 (H) 70 - 99 mg/dL  Glucose, capillary   Collection Time: 07/23/21  5:52 AM  Result Value Ref Range   Glucose-Capillary 84 70 - 99 mg/dL  Glucose, capillary   Collection Time: 07/23/21 10:07 AM  Result Value Ref Range   Glucose-Capillary 91 70 - 99 mg/dL    Discharge Condition: Stable  Disposition: Discharge disposition: 01-Home or Self Care        Discharge Instructions     Discharge activity:  No Restrictions   Complete by: As directed    Discharge diet:  No restrictions   Complete by: As directed    Fetal Kick Count:  Lie on our left side for one hour after a meal, and count the number of times your baby kicks.  If it is less than 5 times, get up, move around and drink some juice.  Repeat the test 30 minutes later.  If it is still less than 5 kicks in an hour, notify your doctor.   Complete by: As directed    No sexual activity restrictions   Complete by: As  directed    Notify physician for a general feeling that "something is not right"   Complete by: As directed    Notify physician for increase or change in vaginal discharge   Complete by: As directed    Notify physician for intestinal cramps, with or without diarrhea, sometimes described as "gas pain"   Complete by: As directed    Notify physician for leaking of fluid   Complete by: As directed    Notify physician for low, dull backache, unrelieved by heat or Tylenol   Complete by: As directed    Notify physician for menstrual like cramps   Complete by: As directed    Notify physician for pelvic pressure   Complete by: As directed    Notify physician for uterine contractions.  These may be painless and feel like the uterus is tightening or the baby is  "balling up"   Complete by: As directed    Notify physician for vaginal bleeding   Complete by: As directed    PRETERM LABOR:  Includes any of the follwing symptoms that occur between 20 - [redacted] weeks gestation.  If these  symptoms are not stopped, preterm labor can result in preterm delivery, placing your baby at risk   Complete by: As directed       Allergies as of 07/23/2021   No Known Allergies      Medication List     TAKE these medications    levETIRAcetam 500 MG tablet Commonly known as: KEPPRA Take 1 tablet (500 mg total) by mouth 2 (two) times daily. What changed:  medication strength how much to take   prenatal multivitamin Tabs tablet Take 1 tablet by mouth daily at 12 noon.        Follow-up [place] for [facility] Follow up.   Specialty: Obstetrics and Gynecology Why: 10:30 AM Thursday, July 30 2021 Contact information: [place] [phone]                Signed: Chancy Milroy M.D. 07/23/2021, 12:18 PM

## 2021-07-24 NOTE — Progress Notes (Signed)
CSW met pt at her bedside in room 109.  When CSW arrived, MOB was resting in bed.  CSW introduced self and explained CSW's role.  MOB was aware that CSW wanted to follow-up regarding safe housing for MOB prior to MOB's discharge.  MOB acknowledged that she had a safe place to go post discharge however, it is temporary housing.  CSW provided MOB with local housing resources and encouraged MOB to apply; MOB agreed. CSW assessed for safety regarding the place that MOB will go to post discharge. MOB stated, "It's safe and I don't feel like no one harm me or make me feel unsafe it's just I'm uncomfortable because I cannot stay there that long." CSW suggested that MOB apply for housing in [facility] due to the waiting list not being as long as [place] for stable housing; MOB declined and communicated that she rather stay in [place].  CSW provided MOB with CSW contact information and encouraged MOB to call if a need arises. MOB is aware that she will be re-assessed post delivery.   MD and bedside RN updated.   Laurey Arrow, MSW, LCSW Clinical Social Work [phone]

## 2021-07-30 ENCOUNTER — Other Ambulatory Visit (HOSPITAL_COMMUNITY)
Admission: RE | Admit: 2021-07-30 | Discharge: 2021-07-30 | Disposition: A | Discharge status: Home / Self Care | Payer: Medicaid Other | Source: Ambulatory Visit | Attending: Family Medicine | Admitting: Family Medicine

## 2021-07-30 ENCOUNTER — Encounter: Payer: Self-pay | Admitting: Family Medicine

## 2021-07-30 ENCOUNTER — Other Ambulatory Visit: Payer: Self-pay

## 2021-07-30 ENCOUNTER — Ambulatory Visit (INDEPENDENT_AMBULATORY_CARE_PROVIDER_SITE_OTHER): Payer: Medicaid Other | Admitting: Family Medicine

## 2021-07-30 VITALS — BP 100/74 | HR 87 | Wt 113.4 lb

## 2021-07-30 DIAGNOSIS — O365930 Maternal care for other known or suspected poor fetal growth, third trimester, not applicable or unspecified: Secondary | ICD-10-CM

## 2021-07-30 DIAGNOSIS — O0993 Supervision of high risk pregnancy, unspecified, third trimester: Secondary | ICD-10-CM | POA: Diagnosis not present

## 2021-07-30 DIAGNOSIS — Z59819 Housing instability, housed unspecified: Secondary | ICD-10-CM | POA: Diagnosis not present

## 2021-07-30 DIAGNOSIS — O34219 Maternal care for unspecified type scar from previous cesarean delivery: Secondary | ICD-10-CM | POA: Insufficient documentation

## 2021-07-30 DIAGNOSIS — Z8759 Personal history of other complications of pregnancy, childbirth and the puerperium: Secondary | ICD-10-CM

## 2021-07-30 DIAGNOSIS — O409XX0 Polyhydramnios, unspecified trimester, not applicable or unspecified: Secondary | ICD-10-CM | POA: Insufficient documentation

## 2021-07-30 DIAGNOSIS — R569 Unspecified convulsions: Secondary | ICD-10-CM | POA: Diagnosis not present

## 2021-07-30 DIAGNOSIS — O2343 Unspecified infection of urinary tract in pregnancy, third trimester: Secondary | ICD-10-CM

## 2021-07-30 DIAGNOSIS — O0990 Supervision of high risk pregnancy, unspecified, unspecified trimester: Secondary | ICD-10-CM

## 2021-07-30 MED ORDER — DIAZEPAM 2.5 MG RE GEL: 5.0000 mg | Freq: Once | RECTAL | 0 refills | Status: DC
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
## 2021-07-31 LAB — CBC/D/PLT+RPR+RH+ABO+RUBIGG...
Antibody Screen: NEGATIVE
Basophils Absolute: 0.1 10*3/uL (ref 0.0–0.2)
Basos: 1 %
EOS (ABSOLUTE): 0.4 10*3/uL (ref 0.0–0.4)
Eos: 4 %
HCV Ab: NONREACTIVE
HIV Screen 4th Generation wRfx: NONREACTIVE
Hematocrit: 39.6 % (ref 34.0–46.6)
Hemoglobin: 12.5 g/dL (ref 11.1–15.9)
Hepatitis B Surface Ag: NEGATIVE
Immature Grans (Abs): 0.1 10*3/uL (ref 0.0–0.1)
Immature Granulocytes: 1 %
Lymphocytes Absolute: 1.6 10*3/uL (ref 0.7–3.1)
Lymphs: 17 %
MCH: 25.2 pg — ABNORMAL LOW (ref 26.6–33.0)
MCHC: 31.6 g/dL (ref 31.5–35.7)
MCV: 80 fL (ref 79–97)
Monocytes Absolute: 0.6 10*3/uL (ref 0.1–0.9)
Monocytes: 7 %
Neutrophils Absolute: 6.3 10*3/uL (ref 1.4–7.0)
Neutrophils: 70 %
Platelets: 301 10*3/uL (ref 150–450)
RBC: 4.97 x10E6/uL (ref 3.77–5.28)
RDW: 14.4 % (ref 11.7–15.4)
RPR Ser Ql: NONREACTIVE
Rh Factor: POSITIVE
Rubella Antibodies, IGG: 1.43 index (ref >0.99)
WBC: 8.9 10*3/uL (ref 3.4–10.8)

## 2021-07-31 LAB — GC/CHLAMYDIA PROBE AMP (~~LOC~~) NOT AT ARMC
Chlamydia: NEGATIVE
Comment: NEGATIVE
Comment: NORMAL
Neisseria Gonorrhea: NEGATIVE

## 2021-07-31 LAB — HEMOGLOBIN A1C
Est. average glucose Bld gHb Est-mCnc: 103 mg/dL
Hgb A1c MFr Bld: 5.2 % (ref 4.8–5.6)

## 2021-07-31 LAB — HCV INTERPRETATION

## 2021-08-02 LAB — CULTURE, OB URINE

## 2021-08-02 LAB — URINE CULTURE, OB REFLEX

## 2021-08-03 ENCOUNTER — Ambulatory Visit (INDEPENDENT_AMBULATORY_CARE_PROVIDER_SITE_OTHER): Payer: Medicaid Other

## 2021-08-03 ENCOUNTER — Telehealth: Payer: Self-pay

## 2021-08-03 ENCOUNTER — Other Ambulatory Visit: Payer: Self-pay

## 2021-08-03 ENCOUNTER — Ambulatory Visit: Payer: Medicaid Other | Admitting: *Deleted

## 2021-08-03 VITALS — BP 104/79 | HR 96

## 2021-08-03 DIAGNOSIS — O409XX0 Polyhydramnios, unspecified trimester, not applicable or unspecified: Secondary | ICD-10-CM

## 2021-08-03 DIAGNOSIS — O365930 Maternal care for other known or suspected poor fetal growth, third trimester, not applicable or unspecified: Secondary | ICD-10-CM

## 2021-08-03 DIAGNOSIS — O0990 Supervision of high risk pregnancy, unspecified, unspecified trimester: Secondary | ICD-10-CM | POA: Insufficient documentation

## 2021-08-03 MED ORDER — NITROFURANTOIN MONOHYD MACRO 100 MG PO CAPS: 100.0000 mg | ORAL_CAPSULE | Freq: Two times a day (BID) | ORAL | 0 refills | Status: DC

## 2021-08-03 NOTE — Addendum Note (Signed)
Addended by: Geanie Berlin on: 08/03/2021 09:57 AM   Modules accepted: Orders

## 2021-08-03 NOTE — Progress Notes (Signed)

## 2021-08-05 LAB — TOXASSURE SELECT 13 (MW), URINE

## 2021-08-10 ENCOUNTER — Other Ambulatory Visit: Payer: Medicaid Other

## 2021-08-10 ENCOUNTER — Ambulatory Visit: Payer: Medicaid Other | Admitting: *Deleted

## 2021-08-10 ENCOUNTER — Ambulatory Visit: Payer: Medicaid Other | Attending: Obstetrics and Gynecology

## 2021-08-10 ENCOUNTER — Other Ambulatory Visit: Payer: Self-pay | Admitting: Obstetrics and Gynecology

## 2021-08-10 VITALS — BP 101/78 | HR 132

## 2021-08-10 DIAGNOSIS — O99353 Diseases of the nervous system complicating pregnancy, third trimester: Secondary | ICD-10-CM

## 2021-08-10 DIAGNOSIS — O0990 Supervision of high risk pregnancy, unspecified, unspecified trimester: Secondary | ICD-10-CM | POA: Insufficient documentation

## 2021-08-10 DIAGNOSIS — O99333 Smoking (tobacco) complicating pregnancy, third trimester: Secondary | ICD-10-CM

## 2021-08-10 DIAGNOSIS — Z3A35 35 weeks gestation of pregnancy: Secondary | ICD-10-CM

## 2021-08-10 DIAGNOSIS — O34219 Maternal care for unspecified type scar from previous cesarean delivery: Secondary | ICD-10-CM

## 2021-08-10 DIAGNOSIS — O365930 Maternal care for other known or suspected poor fetal growth, third trimester, not applicable or unspecified: Secondary | ICD-10-CM | POA: Diagnosis present

## 2021-08-10 DIAGNOSIS — O403XX0 Polyhydramnios, third trimester, not applicable or unspecified: Secondary | ICD-10-CM

## 2021-08-10 DIAGNOSIS — O0933 Supervision of pregnancy with insufficient antenatal care, third trimester: Secondary | ICD-10-CM

## 2021-08-10 DIAGNOSIS — G40909 Epilepsy, unspecified, not intractable, without status epilepticus: Secondary | ICD-10-CM | POA: Diagnosis not present

## 2021-08-10 DIAGNOSIS — F17210 Nicotine dependence, cigarettes, uncomplicated: Secondary | ICD-10-CM

## 2021-08-10 DIAGNOSIS — O409XX0 Polyhydramnios, unspecified trimester, not applicable or unspecified: Secondary | ICD-10-CM | POA: Insufficient documentation

## 2021-08-10 DIAGNOSIS — O09293 Supervision of pregnancy with other poor reproductive or obstetric history, third trimester: Secondary | ICD-10-CM

## 2021-08-13 ENCOUNTER — Encounter: Payer: Self-pay | Admitting: Family Medicine

## 2021-08-13 ENCOUNTER — Encounter: Payer: Medicaid Other | Admitting: Family Medicine

## 2021-08-13 NOTE — Progress Notes (Signed)
Patient did not keep appointment today. She will be called to reschedule.  

## 2021-08-17 ENCOUNTER — Other Ambulatory Visit: Payer: Medicaid Other

## 2021-08-18 ENCOUNTER — Ambulatory Visit: Payer: Medicaid Other | Attending: Family Medicine

## 2021-08-18 ENCOUNTER — Encounter: Payer: Self-pay | Admitting: *Deleted

## 2021-08-18 ENCOUNTER — Ambulatory Visit: Payer: Medicaid Other | Admitting: *Deleted

## 2021-08-18 VITALS — BP 105/74 | HR 90

## 2021-08-18 DIAGNOSIS — O409XX0 Polyhydramnios, unspecified trimester, not applicable or unspecified: Secondary | ICD-10-CM | POA: Insufficient documentation

## 2021-08-18 DIAGNOSIS — O365930 Maternal care for other known or suspected poor fetal growth, third trimester, not applicable or unspecified: Secondary | ICD-10-CM | POA: Insufficient documentation

## 2021-08-18 DIAGNOSIS — O0990 Supervision of high risk pregnancy, unspecified, unspecified trimester: Secondary | ICD-10-CM

## 2021-08-24 ENCOUNTER — Other Ambulatory Visit: Payer: Medicaid Other

## 2021-08-25 ENCOUNTER — Ambulatory Visit: Payer: Medicaid Other

## 2021-08-25 ENCOUNTER — Other Ambulatory Visit: Payer: Medicaid Other

## 2021-08-27 ENCOUNTER — Ambulatory Visit: Payer: Medicaid Other

## 2021-08-27 ENCOUNTER — Ambulatory Visit: Payer: Medicaid Other | Attending: Obstetrics and Gynecology

## 2021-08-31 ENCOUNTER — Other Ambulatory Visit: Payer: Self-pay | Admitting: Obstetrics and Gynecology

## 2021-08-31 DIAGNOSIS — O409XX0 Polyhydramnios, unspecified trimester, not applicable or unspecified: Secondary | ICD-10-CM

## 2021-08-31 DIAGNOSIS — O365930 Maternal care for other known or suspected poor fetal growth, third trimester, not applicable or unspecified: Secondary | ICD-10-CM

## 2021-09-10 ENCOUNTER — Inpatient Hospital Stay (HOSPITAL_COMMUNITY): Admit: 2021-09-10 | Payer: Self-pay

## 2021-09-11 ENCOUNTER — Ambulatory Visit: Payer: Self-pay

## 2021-09-11 NOTE — Telephone Encounter (Signed)
   Chief Complaint: Pt. States she was supposed to be induced for labor yesterday. Has not heard from OB/GYN Symptoms: Baby "not moving as much." No other symptoms Frequency: Today Pertinent Negatives: Patient denies  Disposition: [] ED /[] Urgent Care (no appt availability in office) / [] Appointment(In office/virtual)/ []  [place] [facility]/ [] Home Care/ [] Refused Recommended Disposition /[]  Mobile Bus/ [x]  Follow-up with PCP Additional Notes: Instructed to call OB/GYN now.  Answer Assessment - Initial Assessment Questions 1. FETAL MOVEMENT: "Has the baby's movement decreased or changed significantly from normal?" (e.g., yes, no; describe) "When was the last time you felt the baby move?" (e.g., minutes, hours)     Today 2. EDD: "What date are you expecting to deliver?"      09/10/21 3. PREGNANCY: "How many weeks pregnant are you?" "How has the pregnancy been going?"     Good 4. OTHER SYMPTOMS: "Do you have any other symptoms?" (e.g., abdomen pain, fever, leaking fluid from vagina, vaginal bleeding, widespread itching, etc.)     None  Protocols used: Pregnancy - Decreased or Abnormal Fetal Movement-A-AH

## 2021-09-14 ENCOUNTER — Inpatient Hospital Stay (HOSPITAL_COMMUNITY): Payer: Medicaid Other | Admitting: Anesthesiology

## 2021-09-14 ENCOUNTER — Inpatient Hospital Stay (HOSPITAL_COMMUNITY)
Admission: AD | Admit: 2021-09-14 | Discharge: 2021-09-17 | DRG: 787 | Disposition: A | Discharge status: Home / Self Care | Payer: Medicaid Other | Attending: Family Medicine | Admitting: Family Medicine

## 2021-09-14 ENCOUNTER — Other Ambulatory Visit: Payer: Self-pay

## 2021-09-14 ENCOUNTER — Encounter (HOSPITAL_COMMUNITY)
Admission: AD | Disposition: A | Discharge status: Home / Self Care | Payer: Self-pay | Source: Home / Self Care | Attending: Family Medicine

## 2021-09-14 ENCOUNTER — Telehealth: Payer: Self-pay | Admitting: Family Medicine

## 2021-09-14 ENCOUNTER — Encounter (HOSPITAL_COMMUNITY): Payer: Self-pay | Admitting: Obstetrics & Gynecology

## 2021-09-14 DIAGNOSIS — O365931 Maternal care for other known or suspected poor fetal growth, third trimester, fetus 1: Secondary | ICD-10-CM

## 2021-09-14 DIAGNOSIS — O0990 Supervision of high risk pregnancy, unspecified, unspecified trimester: Principal | ICD-10-CM

## 2021-09-14 DIAGNOSIS — O34211 Maternal care for low transverse scar from previous cesarean delivery: Secondary | ICD-10-CM

## 2021-09-14 DIAGNOSIS — Z8759 Personal history of other complications of pregnancy, childbirth and the puerperium: Secondary | ICD-10-CM

## 2021-09-14 DIAGNOSIS — O403XX0 Polyhydramnios, third trimester, not applicable or unspecified: Secondary | ICD-10-CM | POA: Diagnosis present

## 2021-09-14 DIAGNOSIS — O368130 Decreased fetal movements, third trimester, not applicable or unspecified: Principal | ICD-10-CM | POA: Diagnosis present

## 2021-09-14 DIAGNOSIS — O0930 Supervision of pregnancy with insufficient antenatal care, unspecified trimester: Secondary | ICD-10-CM

## 2021-09-14 DIAGNOSIS — O480 Post-term pregnancy: Secondary | ICD-10-CM | POA: Diagnosis present

## 2021-09-14 DIAGNOSIS — Z87891 Personal history of nicotine dependence: Secondary | ICD-10-CM

## 2021-09-14 DIAGNOSIS — O9902 Anemia complicating childbirth: Secondary | ICD-10-CM | POA: Diagnosis present

## 2021-09-14 DIAGNOSIS — F32A Depression, unspecified: Secondary | ICD-10-CM | POA: Diagnosis present

## 2021-09-14 DIAGNOSIS — Z3A40 40 weeks gestation of pregnancy: Secondary | ICD-10-CM

## 2021-09-14 DIAGNOSIS — G40909 Epilepsy, unspecified, not intractable, without status epilepticus: Secondary | ICD-10-CM | POA: Diagnosis present

## 2021-09-14 DIAGNOSIS — O99354 Diseases of the nervous system complicating childbirth: Secondary | ICD-10-CM | POA: Diagnosis present

## 2021-09-14 DIAGNOSIS — Z98891 History of uterine scar from previous surgery: Secondary | ICD-10-CM

## 2021-09-14 DIAGNOSIS — O365930 Maternal care for other known or suspected poor fetal growth, third trimester, not applicable or unspecified: Secondary | ICD-10-CM | POA: Diagnosis present

## 2021-09-14 DIAGNOSIS — O34219 Maternal care for unspecified type scar from previous cesarean delivery: Secondary | ICD-10-CM | POA: Diagnosis present

## 2021-09-14 DIAGNOSIS — R569 Unspecified convulsions: Secondary | ICD-10-CM

## 2021-09-14 DIAGNOSIS — O409XX0 Polyhydramnios, unspecified trimester, not applicable or unspecified: Secondary | ICD-10-CM | POA: Diagnosis present

## 2021-09-14 HISTORY — DX: Unspecified convulsions: R56.9

## 2021-09-14 LAB — COMPREHENSIVE METABOLIC PANEL
ALT: 9 U/L (ref 0–44)
AST: 18 U/L (ref 15–41)
Albumin: 2.9 g/dL — ABNORMAL LOW (ref 3.5–5.0)
Alkaline Phosphatase: 180 U/L — ABNORMAL HIGH (ref 38–126)
Anion gap: 9 (ref 5–15)
BUN: 6 mg/dL (ref 6–20)
CO2: 17 mmol/L — ABNORMAL LOW (ref 22–32)
Calcium: 9.3 mg/dL (ref 8.9–10.3)
Chloride: 111 mmol/L (ref 98–111)
Creatinine, Ser: 0.54 mg/dL (ref 0.44–1.00)
GFR, Estimated: >60 mL/min (ref >60)
Glucose, Bld: 125 mg/dL — ABNORMAL HIGH (ref 70–99)
Potassium: 3.4 mmol/L — ABNORMAL LOW (ref 3.5–5.1)
Sodium: 137 mmol/L (ref 135–145)
Total Bilirubin: 0.3 mg/dL (ref 0.3–1.2)
Total Protein: 7 g/dL (ref 6.5–8.1)

## 2021-09-14 LAB — TYPE AND SCREEN
ABO/RH(D): O POS
Antibody Screen: NEGATIVE

## 2021-09-14 LAB — CBC
HCT: 36.1 % (ref 36.0–46.0)
Hemoglobin: 11.2 g/dL — ABNORMAL LOW (ref 12.0–15.0)
MCH: 23.8 pg — ABNORMAL LOW (ref 26.0–34.0)
MCHC: 31 g/dL (ref 30.0–36.0)
MCV: 76.6 fL — ABNORMAL LOW (ref 80.0–100.0)
Platelets: 277 10*3/uL (ref 150–400)
RBC: 4.71 MIL/uL (ref 3.87–5.11)
RDW: 17.2 % — ABNORMAL HIGH (ref 11.5–15.5)
WBC: 8.1 10*3/uL (ref 4.0–10.5)
nRBC: 0 % (ref 0.0–0.2)

## 2021-09-14 LAB — RAPID URINE DRUG SCREEN, HOSP PERFORMED
Amphetamines: NOT DETECTED
Barbiturates: NOT DETECTED
Benzodiazepines: NOT DETECTED
Cocaine: NOT DETECTED
Opiates: NOT DETECTED
Tetrahydrocannabinol: NOT DETECTED

## 2021-09-14 LAB — URINALYSIS, ROUTINE W REFLEX MICROSCOPIC
Bilirubin Urine: NEGATIVE
Glucose, UA: NEGATIVE mg/dL
Hgb urine dipstick: NEGATIVE
Ketones, ur: NEGATIVE mg/dL
Nitrite: NEGATIVE
Protein, ur: NEGATIVE mg/dL
Specific Gravity, Urine: 1.002 — ABNORMAL LOW (ref 1.005–1.030)
pH: 6 (ref 5.0–8.0)

## 2021-09-14 LAB — GROUP B STREP BY PCR: Group B strep by PCR: NEGATIVE

## 2021-09-14 SURGERY — Surgical Case
Anesthesia: Spinal

## 2021-09-14 MED ORDER — MORPHINE SULFATE (PF) 0.5 MG/ML IJ SOLN
INTRAMUSCULAR | Status: AC
  Filled 2021-09-14: qty 10

## 2021-09-14 MED ORDER — ACETAMINOPHEN 500 MG PO TABS: 1000.0000 mg | ORAL_TABLET | Freq: Four times a day (QID) | ORAL | Status: DC

## 2021-09-14 MED ORDER — FENTANYL CITRATE (PF) 100 MCG/2ML IJ SOLN
INTRAMUSCULAR | Status: AC
  Filled 2021-09-14: qty 2

## 2021-09-14 MED ORDER — SCOPOLAMINE 1 MG/3DAYS TD PT72
1.0000 | MEDICATED_PATCH | Freq: Once | TRANSDERMAL | Status: DC
  Administered 2021-09-14: 1.5 mg via TRANSDERMAL

## 2021-09-14 MED ORDER — ONDANSETRON HCL 4 MG/2ML IJ SOLN: 4.0000 mg | Freq: Three times a day (TID) | INTRAMUSCULAR | Status: DC | PRN

## 2021-09-14 MED ORDER — SOD CITRATE-CITRIC ACID 500-334 MG/5ML PO SOLN
30.0000 mL | ORAL | Status: AC
  Administered 2021-09-14: 30 mL via ORAL
  Filled 2021-09-14: qty 30

## 2021-09-14 MED ORDER — DIPHENHYDRAMINE HCL 25 MG PO CAPS: 25.0000 mg | ORAL_CAPSULE | ORAL | Status: DC | PRN

## 2021-09-14 MED ORDER — SODIUM CHLORIDE 0.9% FLUSH: 3.0000 mL | INTRAVENOUS | Status: DC | PRN

## 2021-09-14 MED ORDER — BUPIVACAINE HCL (PF) 0.25 % IJ SOLN
INTRAMUSCULAR | Status: AC
  Filled 2021-09-14: qty 30

## 2021-09-14 MED ORDER — ACETAMINOPHEN 500 MG PO TABS: 1000.0000 mg | ORAL_TABLET | Freq: Once | ORAL | Status: DC

## 2021-09-14 MED ORDER — CEFAZOLIN SODIUM-DEXTROSE 2-4 GM/100ML-% IV SOLN
INTRAVENOUS | Status: AC
  Filled 2021-09-14: qty 100

## 2021-09-14 MED ORDER — BUPIVACAINE HCL 0.25 % IJ SOLN
INTRAMUSCULAR | Status: DC | PRN
  Administered 2021-09-14: 30 mL

## 2021-09-14 MED ORDER — OXYCODONE HCL 5 MG PO TABS: 5.0000 mg | ORAL_TABLET | Freq: Once | ORAL | Status: DC | PRN

## 2021-09-14 MED ORDER — ONDANSETRON HCL 4 MG/2ML IJ SOLN
INTRAMUSCULAR | Status: DC | PRN
  Administered 2021-09-14: 4 mg via INTRAVENOUS

## 2021-09-14 MED ORDER — HYDROMORPHONE HCL 1 MG/ML IJ SOLN: 0.2500 mg | INTRAMUSCULAR | Status: DC | PRN

## 2021-09-14 MED ORDER — DIPHENHYDRAMINE HCL 50 MG/ML IJ SOLN: 12.5000 mg | INTRAMUSCULAR | Status: DC | PRN

## 2021-09-14 MED ORDER — OXYCODONE HCL 5 MG/5ML PO SOLN: 5.0000 mg | Freq: Once | ORAL | Status: DC | PRN

## 2021-09-14 MED ORDER — NALOXONE HCL 4 MG/10ML IJ SOLN: 1.0000 ug/kg/h | INTRAVENOUS | Status: DC | PRN

## 2021-09-14 MED ORDER — SODIUM CHLORIDE 0.9 % IR SOLN
Status: DC | PRN
  Administered 2021-09-14: 1000 mL

## 2021-09-14 MED ORDER — LACTATED RINGERS IV BOLUS
1000.0000 mL | Freq: Once | INTRAVENOUS | Status: AC
  Administered 2021-09-14: 1000 mL via INTRAVENOUS

## 2021-09-14 MED ORDER — KETOROLAC TROMETHAMINE 30 MG/ML IJ SOLN: 30.0000 mg | Freq: Once | INTRAMUSCULAR | Status: AC | PRN

## 2021-09-14 MED ORDER — ONDANSETRON HCL 4 MG/2ML IJ SOLN
INTRAMUSCULAR | Status: AC
  Filled 2021-09-14: qty 2

## 2021-09-14 MED ORDER — STERILE WATER FOR IRRIGATION IR SOLN
Status: DC | PRN
  Administered 2021-09-14: 1000 mL

## 2021-09-14 MED ORDER — NALOXONE HCL 0.4 MG/ML IJ SOLN: 0.4000 mg | INTRAMUSCULAR | Status: DC | PRN

## 2021-09-14 MED ORDER — FENTANYL CITRATE (PF) 100 MCG/2ML IJ SOLN: 25.0000 ug | INTRAMUSCULAR | Status: DC | PRN

## 2021-09-14 MED ORDER — ONDANSETRON HCL 4 MG/2ML IJ SOLN: 4.0000 mg | Freq: Once | INTRAMUSCULAR | Status: DC | PRN

## 2021-09-14 MED ORDER — FENTANYL CITRATE (PF) 100 MCG/2ML IJ SOLN
INTRAMUSCULAR | Status: DC | PRN
  Administered 2021-09-14: 15 ug via INTRATHECAL

## 2021-09-14 MED ORDER — OXYTOCIN-SODIUM CHLORIDE 30-0.9 UT/500ML-% IV SOLN
INTRAVENOUS | Status: AC
  Filled 2021-09-14: qty 500

## 2021-09-14 MED ORDER — DROPERIDOL 2.5 MG/ML IJ SOLN: 0.6250 mg | Freq: Once | INTRAMUSCULAR | Status: DC | PRN

## 2021-09-14 MED ORDER — DEXAMETHASONE SODIUM PHOSPHATE 4 MG/ML IJ SOLN
INTRAMUSCULAR | Status: AC
  Filled 2021-09-14: qty 1

## 2021-09-14 MED ORDER — SCOPOLAMINE 1 MG/3DAYS TD PT72
MEDICATED_PATCH | TRANSDERMAL | Status: AC
  Filled 2021-09-14: qty 1

## 2021-09-14 MED ORDER — LACTATED RINGERS IV SOLN: INTRAVENOUS | Status: DC

## 2021-09-14 MED ORDER — KETOROLAC TROMETHAMINE 30 MG/ML IJ SOLN: 30.0000 mg | Freq: Once | INTRAMUSCULAR | Status: DC

## 2021-09-14 MED ORDER — ACETAMINOPHEN 160 MG/5ML PO SOLN: 1000.0000 mg | Freq: Once | ORAL | Status: DC

## 2021-09-14 MED ORDER — DEXAMETHASONE SODIUM PHOSPHATE 4 MG/ML IJ SOLN
INTRAMUSCULAR | Status: DC | PRN
  Administered 2021-09-14: 4 mg via INTRAVENOUS

## 2021-09-14 MED ORDER — LACTATED RINGERS IV SOLN: INTRAVENOUS | Status: DC | PRN

## 2021-09-14 MED ORDER — MORPHINE SULFATE (PF) 0.5 MG/ML IJ SOLN
INTRAMUSCULAR | Status: DC | PRN
  Administered 2021-09-14: 150 ug via EPIDURAL

## 2021-09-14 MED ORDER — OXYTOCIN-SODIUM CHLORIDE 30-0.9 UT/500ML-% IV SOLN
INTRAVENOUS | Status: DC | PRN
  Administered 2021-09-14: 400 [IU] via INTRAVENOUS

## 2021-09-14 MED ORDER — POVIDONE-IODINE 10 % EX SWAB
2.0000 | Freq: Once | CUTANEOUS | Status: AC
  Administered 2021-09-14: 2 via TOPICAL

## 2021-09-14 MED ORDER — CEFAZOLIN SODIUM-DEXTROSE 2-4 GM/100ML-% IV SOLN
2.0000 g | INTRAVENOUS | Status: AC
  Administered 2021-09-14: 2 g via INTRAVENOUS
  Filled 2021-09-14: qty 100

## 2021-09-14 MED ORDER — PHENYLEPHRINE HCL-NACL 20-0.9 MG/250ML-% IV SOLN
INTRAVENOUS | Status: DC | PRN
  Administered 2021-09-14: 60 ug/min via INTRAVENOUS

## 2021-09-14 MED ORDER — MEPERIDINE HCL 25 MG/ML IJ SOLN: 6.2500 mg | INTRAMUSCULAR | Status: DC | PRN

## 2021-09-14 MED ORDER — BUPIVACAINE IN DEXTROSE 0.75-8.25 % IT SOLN
INTRATHECAL | Status: DC | PRN
  Administered 2021-09-14: 11.25 mg via INTRATHECAL

## 2021-09-14 SURGICAL SUPPLY — 33 items
BENZOIN TINCTURE PRP APPL 2/3 (GAUZE/BANDAGES/DRESSINGS) ×1 IMPLANT
CHLORAPREP W/TINT 26 (MISCELLANEOUS) ×2 IMPLANT
CLAMP CORD UMBIL (MISCELLANEOUS) ×1 IMPLANT
CLOTH BEACON ORANGE TIMEOUT ST (SAFETY) ×1 IMPLANT
DRSG OPSITE POSTOP 4X10 (GAUZE/BANDAGES/DRESSINGS) ×1 IMPLANT
ELECT REM PT RETURN 9FT ADLT (ELECTROSURGICAL) ×1
ELECTRODE REM PT RTRN 9FT ADLT (ELECTROSURGICAL) ×1 IMPLANT
EXTRACTOR VACUUM KIWI (MISCELLANEOUS) IMPLANT
EXTRACTOR VACUUM M CUP 4 TUBE (SUCTIONS) IMPLANT
GLOVE BIOGEL PI IND STRL 7.0 (GLOVE) ×3 IMPLANT
GLOVE ECLIPSE 7.0 STRL STRAW (GLOVE) ×1 IMPLANT
GOWN STRL REUS W/TWL LRG LVL3 (GOWN DISPOSABLE) ×2 IMPLANT
KIT ABG SYR 3ML LUER SLIP (SYRINGE) ×1 IMPLANT
NDL HYPO 25X5/8 SAFETYGLIDE (NEEDLE) ×1 IMPLANT
NEEDLE HYPO 22GX1.5 SAFETY (NEEDLE) ×1 IMPLANT
NEEDLE HYPO 25X5/8 SAFETYGLIDE (NEEDLE) ×1 IMPLANT
NS IRRIG 1000ML POUR BTL (IV SOLUTION) ×1 IMPLANT
PACK C SECTION WH (CUSTOM PROCEDURE TRAY) ×1 IMPLANT
PAD ABD 7.5X8 STRL (GAUZE/BANDAGES/DRESSINGS) ×1 IMPLANT
PAD OB MATERNITY 4.3X12.25 (PERSONAL CARE ITEMS) ×1 IMPLANT
RETRACTOR WND ALEXIS 25 LRG (MISCELLANEOUS) IMPLANT
RTRCTR C-SECT PINK 25CM LRG (MISCELLANEOUS) ×1 IMPLANT
RTRCTR WOUND ALEXIS 25CM LRG (MISCELLANEOUS) ×1
STRIP CLOSURE SKIN 1/2X4 (GAUZE/BANDAGES/DRESSINGS) ×1 IMPLANT
SUT MNCRL 0 VIOLET CTX 36 (SUTURE) ×2 IMPLANT
SUT MONOCRYL 0 CTX 36 (SUTURE) ×3
SUT VIC AB 0 CTX 36 (SUTURE) ×1
SUT VIC AB 0 CTX36XBRD ANBCTRL (SUTURE) ×1 IMPLANT
SUT VIC AB 4-0 KS 27 (SUTURE) ×1 IMPLANT
SYR 30ML LL (SYRINGE) ×1 IMPLANT
TOWEL OR 17X24 6PK STRL BLUE (TOWEL DISPOSABLE) ×1 IMPLANT
TRAY FOLEY W/BAG SLVR 14FR LF (SET/KITS/TRAYS/PACK) ×1 IMPLANT
WATER STERILE IRR 1000ML POUR (IV SOLUTION) ×1 IMPLANT
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
## 2021-09-14 NOTE — Anesthesia Procedure Notes (Signed)
Spinal  Patient location during procedure: OB Start time: 09/14/2021 9:32 PM End time: 09/14/2021 9:35 PM Reason for block: surgical anesthesia Staffing Performed: anesthesiologist  Anesthesiologist: Trevor Iha, MD Performed by: Trevor Iha, MD Authorized by: Trevor Iha, MD   Preanesthetic Checklist Completed: patient identified, IV checked, risks and benefits discussed, surgical consent, monitors and equipment checked, pre-op evaluation and timeout performed Spinal Block Patient position: sitting Prep: DuraPrep and site prepped and draped Patient monitoring: heart rate, cardiac monitor, continuous pulse ox and blood pressure Approach: midline Location: L3-4 Injection technique: single-shot Needle Needle type: Pencan  Needle gauge: 24 G Needle length: 10 cm Needle insertion depth: 4 cm Assessment Sensory level: T4 Events: CSF return Additional Notes  1 Attempt (s). Pt tolerated procedure well.

## 2021-09-14 NOTE — Discharge Summary (Cosign Needed Addendum)
Postpartum Discharge Summary      Patient Name: Tracy Foley DOB: 1995/06/29 MRN: [id number]  Date of admission: 09/14/2021 Delivery date:09/14/2021  Delivering provider: Donnamae Jude  Date of discharge: 09/17/2021  Admitting diagnosis: History of cesarean delivery [Z98.891] Delivery of pregnancy by cesarean section [O82] Intrauterine pregnancy: [redacted]w[redacted]d    Secondary diagnosis:  Principal Problem:   Delivery of pregnancy by cesarean section Active Problems:   No prenatal care in current pregnancy   History of pre-eclampsia   Depression   Seizure ([place]   Poor fetal growth affecting management of mother in third trimester   History of cesarean delivery affecting pregnancy   Polyhydramnios affecting pregnancy   Supervision of high risk pregnancy, antepartum   History of cesarean delivery  Additional problems: N/A    Discharge diagnosis: Term Pregnancy Delivered                                              Post partum procedures: N/A Augmentation: N/A Complications: None  Hospital course: Scheduled C/S   26y.o. yo [id number] admitted to the hospital 09/14/2021 for scheduled cesarean section with the following indication:Non-Reassuring FHR.Delivery details are as follows:  Membrane Rupture Time/Date: 9:51 PM ,09/14/2021   Delivery Method:C-Section, Vacuum Assisted  Details of operation can be found in separate operative note.  Patient had an uncomplicated postpartum course.  She is ambulating, tolerating a regular diet, passing flatus, and urinating well. Patient is discharged home in stable condition on  09/17/21        Newborn Data: Birth date:09/14/2021  Birth time:9:54 PM  Gender:Female  Living status:Living  Apgars:9 ,9  Weight:3030 g (6lb 10.9oz)    Magnesium Sulfate received: No BMZ received: No Rhophylac:N/A MMR:N/A T-DaP: N/A Flu: N/A Transfusion:No  Physical exam  Vitals:   09/16/21 0612 09/16/21 1451 09/16/21 2030 09/17/21 0614  BP: 100/73  102/74 103/68 91/65  Pulse: 64 62 74 61  Resp:   16 16  Temp: 97.6 F (36.4 C) 98 F (36.7 C) 98.4 F (36.9 C) 98.2 F (36.8 C)  TempSrc: Oral Oral Oral Oral  SpO2: 99%     Weight:      Height:       General: alert, cooperative, and no distress Lochia: appropriate Uterine Fundus: firm Incision: Healing well with no significant drainage, Dressing is clean, dry, and intact DVT Evaluation: No evidence of DVT seen on physical exam. No significant calf/ankle edema. Labs: Lab Results  Component Value Date   WBC 15.3 (H) 09/15/2021   HGB 9.8 (L) 09/15/2021   HCT 29.2 (L) 09/15/2021   MCV 73.9 (L) 09/15/2021   PLT 229 09/15/2021      Latest Ref Rng & Units 09/14/2021    4:58 PM  CMP  Glucose 70 - 99 mg/dL 125   BUN 6 - 20 mg/dL 6   Creatinine 0.44 - 1.00 mg/dL 0.54   Sodium 135 - 145 mmol/L 137   Potassium 3.5 - 5.1 mmol/L 3.4   Chloride 98 - 111 mmol/L 111   CO2 22 - 32 mmol/L 17   Calcium 8.9 - 10.3 mg/dL 9.3   Total Protein 6.5 - 8.1 g/dL 7.0   Total Bilirubin 0.3 - 1.2 mg/dL 0.3   Alkaline Phos 38 - 126 U/L 180   AST 15 - 41 U/L 18   ALT  0 - 44 U/L 9    Edinburgh Score:    09/15/2021   11:06 PM  Edinburgh Postnatal Depression Scale Screening Tool  I have been able to laugh and see the funny side of things. 0  I have looked forward with enjoyment to things. 1  I have blamed myself unnecessarily when things went wrong. 1  I have been anxious or worried for no good reason. 2  I have felt scared or panicky for no good reason. 1  Things have been getting on top of me. 1  I have been so unhappy that I have had difficulty sleeping. 1  I have felt sad or miserable. 1  I have been so unhappy that I have been crying. 0  The thought of harming myself has occurred to me. 0  Edinburgh Postnatal Depression Scale Total 8     After visit meds:  Allergies as of 09/17/2021   No Known Allergies      Medication List     STOP taking these medications    nitrofurantoin  (macrocrystal-monohydrate) 100 MG capsule Commonly known as: MACROBID       TAKE these medications    diazepam 2.5 MG Gel Commonly known as: DIASTAT Place 5 mg rectally once for 1 dose. Give 1 dose for seizure > 5 minutes in length. Call 911 if used   ferrous sulfate 325 (65 FE) MG tablet Take 1 tablet (325 mg total) by mouth every other day.   ibuprofen 600 MG tablet Commonly known as: ADVIL Take 1 tablet (600 mg total) by mouth every 6 (six) hours as needed.   levETIRAcetam 500 MG tablet Commonly known as: KEPPRA Take 1 tablet (500 mg total) by mouth 2 (two) times daily.   oxyCODONE 5 MG immediate release tablet Commonly known as: Oxy IR/ROXICODONE Take 1-2 tablets (5-10 mg total) by mouth every 4 (four) hours as needed for moderate pain.   prenatal multivitamin Tabs tablet Take 1 tablet by mouth daily at 12 noon.         Discharge home in stable condition Infant Feeding: Breast Infant Disposition:home with mother Discharge instruction: per After Visit Summary and Postpartum booklet. Activity: Advance as tolerated. Pelvic rest for 6 weeks.  Diet: routine diet  Future Appointments: Future Appointments  Date Time Provider [place]  09/22/2021  2:00 PM [facility] NURSE [facility] [place]  10/27/2021 10:15 AM Clarnce Flock, MD [facility]   Follow up Visit:  Message sent to [facility] 09/17/21-- Colter L Cashion, MD  Please schedule this patient for a In person postpartum visit in 6 weeks with the following provider: Any provider. Additional Postpartum F/U:Postpartum Depression checkup and Incision check 1 week  High risk pregnancy complicated by:  Seizures Delivery mode:  C-Section, Vacuum Assisted  Anticipated Birth Control:   declined   Apolonio Schneiders, MD Resident Physician 09/17/2021  CNM attestation I have seen and examined this patient and agree with above documentation in the resident's note.   Tracy Foley is a 26 y.o. [id number] s/p rLTCS.    Pain is well controlled.  Plan for birth control is oral progesterone-only contraceptive.  Method of Feeding: breast  PE:  BP 91/65 (BP Location: Left Arm)   Pulse 61   Temp 98.2 F (36.8 C) (Oral)   Resp 16   Ht 4' 11"  (1.499 m)   Wt 58.1 kg   LMP 12/04/2020 (Approximate) Comment: Pt is pregnant  SpO2 99%   Breastfeeding Unknown   BMI 25.85 kg/m  Fundus firm  Recent Labs    09/14/21 1658 09/15/21 0434  HGB 11.2* 9.8*  HCT 36.1 29.2*     Plan: discharge today - postpartum care discussed - f/u clinic in 1wk for incision check; 4-6 weeks for postpartum visit   Myrtis Ser, CNM 2:02 PM 09/17/2021

## 2021-09-14 NOTE — Transfer of Care (Signed)
Immediate Anesthesia Transfer of Care Note  Patient: Tracy Foley  Procedure(s) Performed: CESAREAN SECTION  Patient Location: PACU  Anesthesia Type:Spinal  Level of Consciousness: awake, alert  and patient cooperative  Airway & Oxygen Therapy: Patient Spontanous Breathing  Post-op Assessment: Report given to RN and Post -op Vital signs reviewed and stable  Post vital signs: Reviewed and stable  Last Vitals:  Vitals Value Taken Time  BP 113/57 09/14/21 2245  Temp    Pulse 70 09/14/21 2249  Resp 23 09/14/21 2249  SpO2 97 % 09/14/21 2249  Vitals shown include unvalidated device data.  Last Pain:  Vitals:   09/14/21 1849  TempSrc: Oral  PainSc:          Complications: No notable events documented.

## 2021-09-14 NOTE — MAU Note (Signed)
.  Tracy Foley is a 26 y.o. at [redacted]w[redacted]d here in MAU reporting: called in to schedule and appointment but her doctor told her to come to MAU to be seen. Patient reports vaginal pain x4 days and DFM x3 days. Also reporting white milky discharge but denies LOF like her water is broken.   Pain score: 7  FHT:164

## 2021-09-14 NOTE — Anesthesia Preprocedure Evaluation (Addendum)
Anesthesia Evaluation  Patient identified by MRN, date of birth, ID band Patient awake    Reviewed: Allergy & Precautions, NPO status , Patient's Chart, lab work & pertinent test results  History of Anesthesia Complications Negative for: history of anesthetic complications  Airway Mallampati: II  TM Distance: >3 FB Neck ROM: Full    Dental no notable dental hx. (+) Teeth Intact, Dental Advisory Given   Pulmonary asthma , former smoker,    Pulmonary exam normal breath sounds clear to auscultation       Cardiovascular negative cardio ROS Normal cardiovascular exam Rhythm:Regular Rate:Normal     Neuro/Psych Seizures -,  Depression    GI/Hepatic negative GI ROS, Neg liver ROS,   Endo/Other  negative endocrine ROS  Renal/GU negative Renal ROS  negative genitourinary   Musculoskeletal negative musculoskeletal ROS (+)   Abdominal   Peds  Hematology Lab Results      Component                Value               Date                          HGB                      11.2 (L)            09/14/2021                HCT                      36.1                09/14/2021                     PLT                      277                 09/14/2021              Anesthesia Other Findings Day of surgery medications reviewed with patient.  Reproductive/Obstetrics (+) Pregnancy (Hx of C/S x2)                          Anesthesia Physical Anesthesia Plan  ASA: 3  Anesthesia Plan: Spinal   Post-op Pain Management: Regional block*   Induction:   PONV Risk Score and Plan: 4 or greater and Treatment may vary due to age or medical condition, Ondansetron and Dexamethasone  Airway Management Planned: Natural Airway and Nasal Cannula  Additional Equipment: None  Intra-op Plan:   Post-operative Plan:   Informed Consent:     Dental advisory given  Plan Discussed with:   Anesthesia Plan Comments:  (  40.4 wk G5P3 for repeat C/S x 3 )     Anesthesia Quick Evaluation

## 2021-09-14 NOTE — Telephone Encounter (Signed)
Patient called in stating she is [redacted]w[redacted]d today and wants to be seen. After speaking with Dr. Vergie Living, he reviewed her chart and advised the patient to not eat anything and head to MAU. The patient agreed upon this plan and said she would head that way soon.
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
## 2021-09-14 NOTE — Anesthesia Postprocedure Evaluation (Signed)
Anesthesia Post Note  Patient: Geologist, engineering  Procedure(s) Performed: CESAREAN SECTION     Patient location during evaluation: Mother Baby Anesthesia Type: Spinal Level of consciousness: oriented and awake and alert Pain management: pain level controlled Vital Signs Assessment: post-procedure vital signs reviewed and stable Respiratory status: spontaneous breathing and respiratory function stable Cardiovascular status: blood pressure returned to baseline and stable Postop Assessment: no headache, no backache, no apparent nausea or vomiting and able to ambulate Anesthetic complications: no   No notable events documented.  Last Vitals:  Vitals:   09/14/21 2315 09/14/21 2330  BP: 112/63 115/65  Pulse: 80 79  Resp: 17 17  Temp: 36.7 C   SpO2: 100% 100%    Last Pain:  Vitals:   09/14/21 2330  TempSrc:   PainSc: 0-No pain   Pain Goal:    LLE Motor Response: Purposeful movement (09/14/21 2330) LLE Sensation: Tingling (09/14/21 2330) RLE Motor Response: Purposeful movement (09/14/21 2330) RLE Sensation: Numbness (09/14/21 2330)     Epidural/Spinal Function Cutaneous sensation: Able to Wiggle Toes (09/14/21 2330), Patient able to flex knees: No (09/14/21 2330), Patient able to lift hips off bed: No (09/14/21 2330), Back pain beyond tenderness at insertion site: No (09/14/21 2330), Progressively worsening motor and/or sensory loss: No (09/14/21 2330), Bowel and/or bladder incontinence post epidural: No (09/14/21 2330)  Trevor Iha

## 2021-09-14 NOTE — Op Note (Addendum)
Tracy Foley PROCEDURE DATE: 09/14/2021  PREOPERATIVE DIAGNOSES: Intrauterine pregnancy at [redacted]w[redacted]d weeks gestation; non-reassuring fetal status  POSTOPERATIVE DIAGNOSES: The same, viable infant delivered  PROCEDURE: RepeatLow Transverse Cesarean Section  SURGEON:  Dr. Tinnie Gens  ASSISTANT:  Myrtie Hawk, DO An experienced assistant was required given the standard of surgical care given the complexity of the case.  This assistant was needed for exposure, dissection, suctioning, retraction, instrument exchange, assisting with delivery with administration of fundal pressure, and for overall help during the procedure.  ANESTHESIOLOGY TEAM: Anesthesiologist: Trevor Iha, MD CRNA: Trellis Paganini, CRNA  INDICATIONS: Tracy Foley is a 26 y.o. [phone] at [redacted]w[redacted]d here for cesarean section secondary to the indications listed under preoperative diagnoses; please see preoperative note for further details.  The risks of surgery were discussed with the patient including but were not limited to: bleeding which may require transfusion or reoperation; infection which may require antibiotics; injury to bowel, bladder, ureters or other surrounding organs; injury to the fetus; need for additional procedures including hysterectomy in the event of a life-threatening hemorrhage; formation of adhesions; placental abnormalities wth subsequent pregnancies; incisional problems; thromboembolic phenomenon and other postoperative/anesthesia complications.  The patient concurred with the proposed plan, giving informed written consent for the procedure.    FINDINGS:  Viable female infant in cephalic presentation.  Apgars 9 and 9.  Amniotic fluid: clear.  Intact placenta, three vessel cord.  Normal uterus, fallopian tubes and ovaries bilaterally.  ANESTHESIA: spinal INTRAVENOUS FLUIDS: 2450 ml   ESTIMATED BLOOD LOSS: 154 ml URINE OUTPUT:  179 ml SPECIMENS: Placenta sent to L&D . COMPLICATIONS: None  immediate  PROCEDURE IN DETAIL:  The patient preoperatively received intravenous antibiotics and had sequential compression devices applied to her lower extremities.  She was then taken to the operating room where spinal anesthesia was found to be adequate. She was then placed in a dorsal supine position with a leftward tilt, and prepped and draped in a sterile manner.  A foley catheter was  placed into her bladder and attached to constant gravity.  After an adequate timeout was performed, a Pfannenstiel skin incision was made with scalpel and carried through to the underlying layer of fascia. The fascia was incised in the midline, and this incision was extended bluntly. The rectus muscles were separated in the midline and the peritoneum was entered bluntly.   The Alexis self-retaining retractor was introduced into the abdominal cavity.  Attention was turned to the lower uterine segment where a low transverse hysterotomy was made with a scalpel and extended bluntly in caudad and cephalad directions.  The soft vacuum cup was positioned over the sagittal suture 3 cm anterior to posterior fontanelle.  Pressure was then increased to 500 mmHg, and pulling was administered in upward fashion while fundal pressure was applied. There were 2 popoffs. The infant was successfully delivered after, the cord was clamped and cut after one minute, and the infant was handed over to the awaiting neonatology team. Uterine massage was then administered, and the placenta delivered intact with a three-vessel cord. The uterus was cleared of clots and debris.  The hysterotomy was closed with 0-Monocryl in a running fashion. A figure-of-eight 0 monocryl serosal stitch was placed to help with hemostasis.    The pelvis was cleared of all clot and debris. Hemostasis was confirmed on all surfaces. The uterine incision was once again inspected and found to be hemostatic. The retractor was removed.  The peritoneum was closed with a 0  Monocryl running  stitch. The fascia was then closed using 0 Vicryl in a running fashion.  The subcutaneous layer was irrigated, any areas of bleeding were cauterized with the bovie,  was found to be hemostatic.  Subcutaneous tissue infused with 30cc 0.25% Marcaine.  The skin was closed with a 4-0 Vicryl subcuticular stitch. The patient tolerated the procedure well. Sponge, instrument and needle counts were correct x 3.  She was taken to the recovery room in stable condition.   Myrtie Hawk, DO FMOB Fellow, Faculty practice [facility], Center for [facility] 09/14/21  11:01 PM

## 2021-09-15 ENCOUNTER — Encounter (HOSPITAL_COMMUNITY): Payer: Self-pay | Admitting: Family Medicine

## 2021-09-15 LAB — CBC
HCT: 29.2 % — ABNORMAL LOW (ref 36.0–46.0)
Hemoglobin: 9.8 g/dL — ABNORMAL LOW (ref 12.0–15.0)
MCH: 24.8 pg — ABNORMAL LOW (ref 26.0–34.0)
MCHC: 33.6 g/dL (ref 30.0–36.0)
MCV: 73.9 fL — ABNORMAL LOW (ref 80.0–100.0)
Platelets: 229 10*3/uL (ref 150–400)
RBC: 3.95 MIL/uL (ref 3.87–5.11)
RDW: 17 % — ABNORMAL HIGH (ref 11.5–15.5)
WBC: 15.3 10*3/uL — ABNORMAL HIGH (ref 4.0–10.5)
nRBC: 0 % (ref 0.0–0.2)

## 2021-09-15 LAB — RPR: RPR Ser Ql: NONREACTIVE

## 2021-09-15 MED ORDER — ACETAMINOPHEN 500 MG PO TABS
1000.0000 mg | ORAL_TABLET | Freq: Four times a day (QID) | ORAL | Status: DC
  Administered 2021-09-15 – 2021-09-17 (×11): 1000 mg via ORAL
  Filled 2021-09-15 (×11): qty 2

## 2021-09-15 MED ORDER — KETOROLAC TROMETHAMINE 30 MG/ML IJ SOLN: 30.0000 mg | Freq: Four times a day (QID) | INTRAMUSCULAR | Status: DC | PRN

## 2021-09-15 MED ORDER — SIMETHICONE 80 MG PO CHEW: 80.0000 mg | CHEWABLE_TABLET | ORAL | Status: DC | PRN

## 2021-09-15 MED ORDER — MEASLES, MUMPS & RUBELLA VAC IJ SOLR: 0.5000 mL | Freq: Once | INTRAMUSCULAR | Status: DC

## 2021-09-15 MED ORDER — FERROUS SULFATE 325 (65 FE) MG PO TABS
325.0000 mg | ORAL_TABLET | ORAL | Status: DC
  Administered 2021-09-15 – 2021-09-17 (×2): 325 mg via ORAL
  Filled 2021-09-15 (×2): qty 1

## 2021-09-15 MED ORDER — PRENATAL MULTIVITAMIN CH
1.0000 | ORAL_TABLET | Freq: Every day | ORAL | Status: DC
  Administered 2021-09-15 – 2021-09-17 (×3): 1 via ORAL
  Filled 2021-09-15 (×3): qty 1

## 2021-09-15 MED ORDER — OXYCODONE HCL 5 MG PO TABS: 5.0000 mg | ORAL_TABLET | ORAL | Status: DC | PRN

## 2021-09-15 MED ORDER — COCONUT OIL OIL: 1.0000 | TOPICAL_OIL | Status: DC | PRN

## 2021-09-15 MED ORDER — DIBUCAINE (PERIANAL) 1 % EX OINT: 1.0000 | TOPICAL_OINTMENT | CUTANEOUS | Status: DC | PRN

## 2021-09-15 MED ORDER — MENTHOL 3 MG MT LOZG: 1.0000 | LOZENGE | OROMUCOSAL | Status: DC | PRN

## 2021-09-15 MED ORDER — ENOXAPARIN SODIUM 40 MG/0.4ML IJ SOSY
40.0000 mg | PREFILLED_SYRINGE | INTRAMUSCULAR | Status: DC
  Administered 2021-09-15 – 2021-09-17 (×3): 40 mg via SUBCUTANEOUS
  Filled 2021-09-15 (×3): qty 0.4

## 2021-09-15 MED ORDER — WITCH HAZEL-GLYCERIN EX PADS: 1.0000 | MEDICATED_PAD | CUTANEOUS | Status: DC | PRN

## 2021-09-15 MED ORDER — ZOLPIDEM TARTRATE 5 MG PO TABS: 5.0000 mg | ORAL_TABLET | Freq: Every evening | ORAL | Status: DC | PRN

## 2021-09-15 MED ORDER — KETOROLAC TROMETHAMINE 30 MG/ML IJ SOLN
30.0000 mg | Freq: Four times a day (QID) | INTRAMUSCULAR | Status: AC
  Administered 2021-09-15 (×4): 30 mg via INTRAVENOUS
  Filled 2021-09-15 (×4): qty 1

## 2021-09-15 MED ORDER — TETANUS-DIPHTH-ACELL PERTUSSIS 5-2.5-18.5 LF-MCG/0.5 IM SUSY: 0.5000 mL | PREFILLED_SYRINGE | Freq: Once | INTRAMUSCULAR | Status: DC

## 2021-09-15 MED ORDER — SENNOSIDES-DOCUSATE SODIUM 8.6-50 MG PO TABS
2.0000 | ORAL_TABLET | Freq: Every day | ORAL | Status: DC
  Administered 2021-09-15 – 2021-09-17 (×3): 2 via ORAL
  Filled 2021-09-15 (×3): qty 2

## 2021-09-15 MED ORDER — MAGNESIUM HYDROXIDE 400 MG/5ML PO SUSP: 30.0000 mL | ORAL | Status: DC | PRN

## 2021-09-15 MED ORDER — LACTATED RINGERS IV SOLN: INTRAVENOUS | Status: DC

## 2021-09-15 MED ORDER — IBUPROFEN 600 MG PO TABS
600.0000 mg | ORAL_TABLET | Freq: Four times a day (QID) | ORAL | Status: DC
  Administered 2021-09-15 – 2021-09-17 (×7): 600 mg via ORAL
  Filled 2021-09-15 (×7): qty 1

## 2021-09-15 MED ORDER — GABAPENTIN 100 MG PO CAPS
200.0000 mg | ORAL_CAPSULE | Freq: Every day | ORAL | Status: DC
  Administered 2021-09-15 – 2021-09-16 (×3): 200 mg via ORAL
  Filled 2021-09-15 (×3): qty 2

## 2021-09-15 MED ORDER — SIMETHICONE 80 MG PO CHEW
80.0000 mg | CHEWABLE_TABLET | Freq: Three times a day (TID) | ORAL | Status: DC
  Administered 2021-09-15 – 2021-09-17 (×6): 80 mg via ORAL
  Filled 2021-09-15 (×6): qty 1

## 2021-09-15 MED ORDER — OXYTOCIN-SODIUM CHLORIDE 30-0.9 UT/500ML-% IV SOLN: 2.5000 [IU]/h | INTRAVENOUS | Status: AC

## 2021-09-15 MED ORDER — DIPHENHYDRAMINE HCL 25 MG PO CAPS: 25.0000 mg | ORAL_CAPSULE | Freq: Four times a day (QID) | ORAL | Status: DC | PRN

## 2021-09-15 NOTE — Lactation Note (Signed)
This note was copied from a baby's chart. Lactation Consultation Note  Patient Name: Tracy Foley Date: 09/15/2021 Reason for consult: Initial assessment;Term Age:26 hours   P4: Term infant at 40+4 weeks Feeding preference: Breast/formula  Baby (No name yet) was swaddled and asleep next to birth parent when I arrived.  Birth parent has been latching and supplementing with formula.  Supplementation guidelines given.  Last LATCH score was an 8; voiding/stooling.  Breast feeding basics reviewed.  Offered to return as needed for latch assistance.  Support person present.   Maternal Data Has patient been taught Hand Expression?: Yes Does the patient have breastfeeding experience prior to this delivery?: Yes How long did the patient breastfeed?: Couple of months with her other children  Feeding Mother's Current Feeding Choice: Breast Milk and Formula  LATCH Score                    Lactation Tools Discussed/Used    Interventions    Discharge Pump: Personal  Consult Status Consult Status: Follow-up Date: 09/16/21 Follow-up type: In-patient    [name] 09/15/2021, 8:51 AM

## 2021-09-15 NOTE — Social Work (Signed)
CSW received consult for Possible BUFA/ no custody of other kids. CSW met with MOB to offer support and complete assessment. CSW entered the room, introduced self, CSW role and reason for visit. MOB was agreeable to visit. CSW observed MOB up in bed holding the infant. CSW inquired about how MOB was feeling, MOB reported she was doing well. CSW inquired about any MH hx, MOB reported none. CSW assessed for safety, Mob denied any SI, HI or DV. Per previous SW note, CSW inquired if MOB was able to reach out to shelters in the area about a safe place for MOB to stay and necessary times for the infant. MOB reported she is going to stay with her boyfriend for now. MOB reported they are going to get a car seat but will need a Pack n Play for the infant to sleep. CSW will provide. MOB identified her boyfriend as her support. CSW provided education regarding the baby blues period vs. perinatal mood disorders, discussed treatment and gave resources for mental health follow up if concerns arise.  CSW recommends self-evaluation during the postpartum time period using the New Mom Checklist from Postpartum Progress and encouraged MOB to contact a medical professional if symptoms are noted at any time. CSW inquired about MOB's lack of prenatal care. MOB reported that transportation was a barrier. CSW reminded MOB of medicaid transportation info. CSW explained Hospital Drug screen policy due to MOB having less than 4 prenatal visits. MOB voiced understanding.   CSW provided review of Sudden Infant Death Syndrome (SIDS) precautions. MOB reported she has not picked a pediatrician for the infant at this time.     CSW identifies no further need for intervention and no barriers to discharge at this time.   Tracy Foley, LCSWA Clinical Social Worker [phone] 

## 2021-09-15 NOTE — Progress Notes (Signed)
POSTPARTUM PROGRESS NOTE  POD #1  Subjective:  Tracy Foley is a 26 y.o. [id number] s/p rLTCS at [redacted]w[redacted]d. No acute events overnight. She reports she is doing well. She denies any problems with ambulating, voiding or po intake. Denies nausea or vomiting. She has has passed flatus. Pain is well controlled.  Lochia is adequate.  Objective: Blood pressure 108/76, pulse (!) 53, temperature 98.1 F (36.7 C), temperature source Oral, resp. rate 18, height 4\' 11"  (1.499 m), weight 58.1 kg, last menstrual period 12/04/2020, SpO2 100 %, unknown if currently breastfeeding.  Physical Exam:  General: alert, cooperative and no distress Chest: no respiratory distress Heart: regular rate, distal pulses intact Uterine Fundus: firm, appropriately tender DVT Evaluation: No calf swelling or tenderness Extremities: mild edema Skin: warm, dry; incision clean/dry/intact w/ honeycomb dressing in place  Recent Labs    09/14/21 1658 09/15/21 0434  HGB 11.2* 9.8*  HCT 36.1 29.2*    Assessment/Plan: Tracy Foley is a 26 y.o. 34 s/p rLTCS at [redacted]w[redacted]d for IUGR, poly, seizures, elective.  POD#1 - Doing welll; pain well controlled. H/H appropriate  Routine postpartum care  OOB, ambulate  Lovenox for VTE prophylaxis Anemia: asymptomatic  Start po ferrous sulfate every other day Contraception: POPs Feeding: Breast and bottle  Dispo: Plan for discharge tomorrow.   LOS: 1 day   [redacted]w[redacted]d, DO OB Fellow  09/15/2021, 7:39 AM

## 2021-09-16 MED ORDER — LEVETIRACETAM 500 MG PO TABS
500.0000 mg | ORAL_TABLET | Freq: Two times a day (BID) | ORAL | Status: DC
  Administered 2021-09-16: 500 mg via ORAL
  Filled 2021-09-16 (×3): qty 1

## 2021-09-16 NOTE — Progress Notes (Addendum)
POSTPARTUM PROGRESS NOTE  POD #2  Subjective:  Tracy Foley is a 26 y.o. [id number] s/p rLTCS at [redacted]w[redacted]d. No acute events overnight. She reports she is doing well. She denies any problems with ambulating, voiding or po intake. Denies nausea or vomiting. She has has passed flatus. Pain is well controlled.  Lochia is adequate.  Objective:  Blood pressure 100/73, pulse 64, temperature 97.6 F (36.4 C), temperature source Oral, resp. rate 18, height 4\' 11"  (1.499 m), weight 58.1 kg, last menstrual period 12/04/2020, SpO2 99 %, unknown if currently breastfeeding.  Physical Exam:  General: alert, cooperative and no distress Chest: no respiratory distress Heart:regular rate, distal pulses intact Abdomen: soft, nontender,  Uterine Fundus: firm, appropriately tender DVT Evaluation: No calf swelling or tenderness Extremities: No edema Skin: warm, dry; incision clean/dry/intact w/ honeycomb dressing in place. Dried blood on honeycomb dressing but no active bleeding.  Recent Labs    09/14/21 1658 09/15/21 0434  HGB 11.2* 9.8*  HCT 36.1 29.2*    Assessment/Plan: Tracy Foley is a 26 y.o. 34 s/p rLTCS at [redacted]w[redacted]d for IUGR, poly, seizures, elective repeat.  POD#2 - Doing welll; pain well controlled. H/H appropriate  Routine postpartum care  OOB, ambulated  Lovenox for VTE prophylaxis Anemia: asymptomatic  Continue ferrous sulfate Contraception: POPs Feeding: Breast  Dispo: Plan for discharge 09/16/21.   LOS: 2 days   09/18/21, MD Resident Physician 09/16/2021, 7:37 AM _________________ GME ATTESTATION:  Evaluation and management procedures were performed by the [facility] Medicine Resident under my supervision. I was immediately available for direct supervision, assistance and direction throughout this encounter.  I also confirm that I have verified the information documented in the resident's note, and that I have also personally reperformed the pertinent components of  the physical exam and all of the medical decision making activities.  I have also made any necessary editorial changes.  [facility], DO OB Fellow, Faculty [facility], Center for [facility] 09/16/2021 8:31 AM

## 2021-09-16 NOTE — Social Work (Signed)
Per [facility] CPS intake Wyatt Mage, MOB has no open CPS involvement at this time. MOB has custody of her 3 other children whom reside with their fathers. No barriers to discharge.   Wende Neighbors, LCSWA Clinical Social Worker [phone]

## 2021-09-17 MED ORDER — FERROUS SULFATE 325 (65 FE) MG PO TABS: 325.0000 mg | ORAL_TABLET | ORAL | 3 refills | Status: DC

## 2021-09-17 MED ORDER — OXYCODONE HCL 5 MG PO TABS: 5.0000 mg | ORAL_TABLET | ORAL | 0 refills | Status: DC | PRN

## 2021-09-17 MED ORDER — IBUPROFEN 600 MG PO TABS: 600.0000 mg | ORAL_TABLET | Freq: Four times a day (QID) | ORAL | 0 refills | Status: DC | PRN

## 2021-09-17 NOTE — Lactation Note (Signed)
This note was copied from a baby's chart. Lactation Consultation Note  Patient Name: Tracy Foley [id number] Date: 09/17/2021   Age:27 hours Attempted to see mom X 2 but was sleeping.  Maternal Data    Feeding Nipple Type: Slow - flow  LATCH Score                    Lactation Tools Discussed/Used    Interventions    Discharge    Consult Status      Charyl Dancer 09/17/2021, 2:37 AM

## 2021-09-22 ENCOUNTER — Ambulatory Visit: Payer: Medicaid Other

## 2021-09-22 ENCOUNTER — Telehealth (HOSPITAL_COMMUNITY): Payer: Self-pay

## 2021-09-22 NOTE — Telephone Encounter (Signed)
Patient did not answer phone call. No voicemail option.  Tracy Foley [facility] 09/22/2021,1931

## 2021-10-01 ENCOUNTER — Telehealth: Payer: Self-pay | Admitting: Family Medicine

## 2021-10-01 NOTE — Telephone Encounter (Signed)
Family connect nurse would like to speak to a nurse regarding this patient.

## 2021-10-02 NOTE — Telephone Encounter (Signed)
Returned call to [employer] nurse, who states patient's first 3 children do not live with her. Family Connects nurse and Medicaid Care Manager have been attempting to contact her, but have been initially unsuccessful. Patient contact reported seeing patient once following delivery. Patient called nursing supervisor of [facility] and was concerned that middle of c-section incision was opening.   Family [facility]s nurse was able to have home visit with patient yesterday. Patient is currently living with father of baby's brother. Pt looked depressed, withdrawn, would not talk. Nurse expressed concern. Pt told nurse that incision was very painful and was hard above incision. Pt states she is concerned that she has postpartum depression. Scant clear drainage from very small open area. Steri strips remain on incision. Redness above incision. Edinburgh 18. Explained to [facility] RN I will call patient to offer a wound check appt and behavioral health resources.   Called pt at mobile number; phone rings and then busy signal is heard. Called home number; phone rings continually, no option for VM. MyChart message sent.

## 2021-10-14 ENCOUNTER — Ambulatory Visit (INDEPENDENT_AMBULATORY_CARE_PROVIDER_SITE_OTHER): Payer: Medicaid Other | Admitting: General Practice

## 2021-10-14 ENCOUNTER — Other Ambulatory Visit: Payer: Self-pay

## 2021-10-14 VITALS — BP 118/78 | HR 66 | Ht 59.0 in | Wt 109.0 lb

## 2021-10-14 DIAGNOSIS — Z1331 Encounter for screening for depression: Secondary | ICD-10-CM

## 2021-10-14 DIAGNOSIS — Z5189 Encounter for other specified aftercare: Secondary | ICD-10-CM

## 2021-10-14 NOTE — Addendum Note (Signed)
Addended by: Vesta Mixer C on: 10/14/2021 03:31 PM   Modules accepted: Orders

## 2021-10-14 NOTE — Progress Notes (Signed)
Patient presents to office today for wound check following repeat c-section on 9/11. She states she has been doing okay but the transition with a new baby has been hard/stressful. Patient states she doesn't feel this incision has healed as well as previous c-section incisions. She also reports difficulty with constipation- has tried one dose of Miralax without success. Discussed trying miralax every 2 hours until she poops then try colace twice a day or miralax 1 dose daily. Incision is mostly approximated except for small 1cm area in the center of the incision. No redness, bleeding, or foul drainage noted. Dr Currie Paris will come in to assess incision.   Koren Bound RN BSN 10/14/21

## 2021-10-23 NOTE — BH Specialist Note (Signed)
Pt did not arrive to video visit and did not answer the phone; Unable to leave message as phone goes to busy signal; left MyChart message for patient.

## 2021-10-26 NOTE — Progress Notes (Signed)
Tracy Foley Visit Note  Tracy Foley is a 26 y.o. [phone] female who presents for a postpartum visit. She is 6 weeks postpartum following a primary cesarean section.  I have fully reviewed the prenatal and intrapartum course. The delivery was at [redacted]w[redacted]d gestational weeks.  Anesthesia: epidural. Postpartum course has been difficult due to depression and concerns about wound healing. Baby is doing well. Baby is feeding by breast. Bleeding staining only. Bowel function is normal. Bladder function is normal. Patient is not sexually active. Contraception method is none. Postpartum depression screening: positive.   The pregnancy intention screening data noted above was reviewed. Potential methods of contraception were discussed. The patient elected to proceed with No data recorded.   Edinburgh Postnatal Depression Scale - 10/27/21 1034       Edinburgh Postnatal Depression Scale:  In the Past 7 Days   I have been able to laugh and see the funny side of things. 2    I have looked forward with enjoyment to things. 2    I have blamed myself unnecessarily when things went wrong. 2    I have been anxious or worried for no good reason. 2    I have felt scared or panicky for no good reason. 2    Things have been getting on top of me. 2    I have been so unhappy that I have had difficulty sleeping. 2    I have felt sad or miserable. 2    I have been so unhappy that I have been crying. 2    The thought of harming myself has occurred to me. 0    Edinburgh Postnatal Depression Scale Total 18             Health Maintenance Due  Topic Date Due   COVID-19 Vaccine (1) Never done   TETANUS/TDAP  Never done   PAP-Cervical Cytology Screening  Never done   PAP SMEAR-Modifier  Never done   INFLUENZA VACCINE  Never done    The following portions of the patient's history were reviewed and updated as appropriate: allergies, current medications, past family history, past medical history, past social  history, past surgical history, and problem list.  Review of Systems Pertinent items noted in HPI and remainder of comprehensive ROS otherwise negative.  Objective:  BP 119/81   Pulse 81   Wt 112 lb 3.2 oz (50.9 kg)   LMP 12/04/2020 (Approximate) Comment: Pt is pregnant  Breastfeeding Yes   BMI 22.66 kg/m    General:  alert, cooperative, and appears stated age   Breasts:  not indicated  Lungs: Comfortalbe on room air  Wound One area of raw skin, offered silver nitrate but declined  GU exam:   declined         Assessment:    There are no diagnoses linked to this encounter.  Normal postpartum exam.   Plan:   Essential components of care per ACOG recommendations:  1.  Mood and well being: Patient with positive depression screening today. Reviewed local resources for support. Strongly advised to present to the [facility] when she feels ready to. Denies SI.  - Patient tobacco use? No.   - hx of drug use? No.    2. Infant care and feeding:  -Patient currently breastmilk feeding? Yes. Reviewed importance of draining breast regularly to support lactation.  -Social determinants of health (SDOH) reviewed in EPIC. No concerns  3. Sexuality, contraception and birth spacing - Patient does not want a  pregnancy in the next year.  Desired family size is 5 children.  - Reviewed reproductive life planning. Reviewed contraceptive methods based on pt preferences and effectiveness.  Patient desired Hormonal Injection today.   - Discussed birth spacing of 18 months  4. Sleep and fatigue -Encouraged family/partner/community support of 4 hrs of uninterrupted sleep to help with mood and fatigue  5. Physical Recovery  - Discussed patients delivery and complications. She describes her labor as good. - Patient had a C-section repeat; no problems after deliver.  - Patient has urinary incontinence? No. - Patient is safe to resume physical and sexual activity  6.  Health Maintenance - HM due items  addressed Yes - Last pap smear No results found for: "DIAGPAP" Pap smear not done at today's visit. She is overdue by about two years. Strongly encouraged her to do that today, she declined. Scheduled for follow up visit in 4 weeks. -Breast Cancer screening indicated? No.   7. Chronic Disease/Pregnancy Condition follow up:  seizure disorder  - PCP follow up  Venora Maples, MD Center for [facility], [facility]

## 2021-10-27 ENCOUNTER — Encounter: Payer: Self-pay | Admitting: Family Medicine

## 2021-10-27 ENCOUNTER — Ambulatory Visit (INDEPENDENT_AMBULATORY_CARE_PROVIDER_SITE_OTHER): Payer: Medicaid Other | Admitting: Family Medicine

## 2021-10-27 ENCOUNTER — Other Ambulatory Visit: Payer: Self-pay

## 2021-10-27 VITALS — BP 119/81 | HR 81 | Wt 112.2 lb

## 2021-10-27 DIAGNOSIS — Z8759 Personal history of other complications of pregnancy, childbirth and the puerperium: Secondary | ICD-10-CM

## 2021-10-27 DIAGNOSIS — O365930 Maternal care for other known or suspected poor fetal growth, third trimester, not applicable or unspecified: Secondary | ICD-10-CM

## 2021-10-27 DIAGNOSIS — Z98891 History of uterine scar from previous surgery: Secondary | ICD-10-CM

## 2021-10-27 DIAGNOSIS — Z30013 Encounter for initial prescription of injectable contraceptive: Secondary | ICD-10-CM | POA: Diagnosis not present

## 2021-10-27 DIAGNOSIS — F32A Depression, unspecified: Secondary | ICD-10-CM

## 2021-10-27 DIAGNOSIS — R569 Unspecified convulsions: Secondary | ICD-10-CM | POA: Diagnosis not present

## 2021-10-27 DIAGNOSIS — O0990 Supervision of high risk pregnancy, unspecified, unspecified trimester: Secondary | ICD-10-CM

## 2021-10-27 MED ORDER — MEDROXYPROGESTERONE ACETATE 150 MG/ML IM SUSP
150.0000 mg | Freq: Once | INTRAMUSCULAR | Status: AC
  Administered 2021-10-27: 150 mg via INTRAMUSCULAR

## 2021-11-06 ENCOUNTER — Ambulatory Visit: Payer: Medicaid Other | Admitting: Clinical

## 2021-11-06 DIAGNOSIS — Z91199 Patient's noncompliance with other medical treatment and regimen due to unspecified reason: Secondary | ICD-10-CM

## 2021-11-12 ENCOUNTER — Ambulatory Visit (INDEPENDENT_AMBULATORY_CARE_PROVIDER_SITE_OTHER): Payer: Medicaid Other | Admitting: Obstetrics and Gynecology

## 2021-11-12 ENCOUNTER — Other Ambulatory Visit: Payer: Self-pay

## 2021-11-12 ENCOUNTER — Other Ambulatory Visit (HOSPITAL_COMMUNITY)
Admission: RE | Admit: 2021-11-12 | Discharge: 2021-11-12 | Disposition: A | Discharge status: Home / Self Care | Payer: Medicaid Other | Source: Ambulatory Visit | Attending: Obstetrics and Gynecology | Admitting: Obstetrics and Gynecology

## 2021-11-12 ENCOUNTER — Encounter: Payer: Self-pay | Admitting: Obstetrics and Gynecology

## 2021-11-12 VITALS — BP 124/82 | HR 75 | Resp 16 | Ht 59.0 in | Wt 110.9 lb

## 2021-11-12 DIAGNOSIS — Z124 Encounter for screening for malignant neoplasm of cervix: Secondary | ICD-10-CM | POA: Insufficient documentation

## 2021-11-12 DIAGNOSIS — R8290 Unspecified abnormal findings in urine: Secondary | ICD-10-CM | POA: Diagnosis not present

## 2021-11-12 LAB — POCT URINALYSIS DIP (DEVICE)
Bilirubin Urine: NEGATIVE
Glucose, UA: NEGATIVE mg/dL
Ketones, ur: NEGATIVE mg/dL
Leukocytes,Ua: NEGATIVE
Nitrite: NEGATIVE
Protein, ur: NEGATIVE mg/dL
Specific Gravity, Urine: >=1.03 (ref 1.005–1.030)
Urobilinogen, UA: 0.2 mg/dL (ref 0.0–1.0)
pH: 5.5 (ref 5.0–8.0)

## 2021-11-12 NOTE — Patient Instructions (Addendum)
It is really important to take care of your mental health especially with the new baby. Please make a follow up with Jamie/behavioral health on your way out today  You can also go on [email or web address] if you want to search for a MH provider that you feel may fit your needs better.   Your incision has healed well and it's ok to take a bath at this time.   Pap smear is used to screen for cervical cancer. When you are under 30 you get them every 3 years, and after 30 about every 5 years. If the pap smear is ever abnormal, you may need further tests to rule out pre-cancer or cancer. Usually symptom of cervical cancer is abnormal heavy bleeding. It's pretty rare here for people to get cervical cancer because we check so frequently. HPV (human papillomavirus) is the virus that increases risk for cervical cancer, which is why most people are vaccinated against it.
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
## 2021-11-13 LAB — CYTOLOGY - PAP: Diagnosis: NEGATIVE

## 2021-11-15 LAB — URINE CULTURE

## 2021-11-17 ENCOUNTER — Other Ambulatory Visit: Payer: Self-pay | Admitting: Obstetrics and Gynecology

## 2021-11-17 DIAGNOSIS — N3001 Acute cystitis with hematuria: Secondary | ICD-10-CM

## 2021-11-17 MED ORDER — AMOXICILLIN-POT CLAVULANATE 875-125 MG PO TABS: 1.0000 | ORAL_TABLET | Freq: Two times a day (BID) | ORAL | 0 refills | Status: AC

## 2021-11-19 ENCOUNTER — Encounter: Payer: Self-pay | Admitting: Family Medicine

## 2021-11-19 ENCOUNTER — Ambulatory Visit: Payer: Medicaid Other | Admitting: Family Medicine

## 2021-11-19 VITALS — BP 108/80 | HR 91 | Temp 97.9°F | Ht 59.0 in | Wt 114.0 lb

## 2021-11-19 DIAGNOSIS — L309 Dermatitis, unspecified: Secondary | ICD-10-CM | POA: Diagnosis not present

## 2021-11-19 DIAGNOSIS — N939 Abnormal uterine and vaginal bleeding, unspecified: Secondary | ICD-10-CM

## 2021-11-19 DIAGNOSIS — F32A Depression, unspecified: Secondary | ICD-10-CM | POA: Diagnosis not present

## 2021-11-19 MED ORDER — TRIAMCINOLONE ACETONIDE 0.025 % EX OINT: 1.0000 | TOPICAL_OINTMENT | Freq: Two times a day (BID) | CUTANEOUS | 0 refills | Status: DC
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
## 2021-11-19 NOTE — Patient Instructions (Signed)
Use the topical steroid ointment no longer than 2 weeks.   Keep your hands dry more often. Use a glove when washing dishes.   Call your OB/GYN if the bleeding does not improve over the next week.   You can call to schedule your appointment with a therapist.    [facility]  [address] [place], [place] [phone]    [facility] [address] [place], [place] [address]  Phone: [phone]  [facility]  [address], [place], [place] [address]  Phone: [phone]

## 2021-11-23 ENCOUNTER — Telehealth: Payer: Self-pay | Admitting: Clinical

## 2021-11-23 NOTE — Telephone Encounter (Signed)
Attempt to return pt call; unable to leave voice message as phone went to busy signal.

## 2021-12-14 ENCOUNTER — Encounter: Payer: Self-pay | Admitting: *Deleted

## 2021-12-17 ENCOUNTER — Ambulatory Visit (INDEPENDENT_AMBULATORY_CARE_PROVIDER_SITE_OTHER): Payer: Medicaid Other

## 2021-12-17 ENCOUNTER — Other Ambulatory Visit: Payer: Self-pay

## 2021-12-17 ENCOUNTER — Ambulatory Visit: Payer: Medicaid Other | Admitting: Clinical

## 2021-12-17 DIAGNOSIS — R8290 Unspecified abnormal findings in urine: Secondary | ICD-10-CM | POA: Diagnosis not present

## 2021-12-17 DIAGNOSIS — Z658 Other specified problems related to psychosocial circumstances: Secondary | ICD-10-CM

## 2021-12-17 LAB — POCT URINALYSIS DIP (DEVICE)
Bilirubin Urine: NEGATIVE
Glucose, UA: NEGATIVE mg/dL
Ketones, ur: NEGATIVE mg/dL
Nitrite: NEGATIVE
Protein, ur: 100 mg/dL — AB
Specific Gravity, Urine: 1.025 (ref 1.005–1.030)
Urobilinogen, UA: 0.2 mg/dL (ref 0.0–1.0)
pH: 5.5 (ref 5.0–8.0)

## 2021-12-17 MED ORDER — PHENAZOPYRIDINE HCL 200 MG PO TABS: 200.0000 mg | ORAL_TABLET | Freq: Three times a day (TID) | ORAL | 0 refills | Status: DC | PRN

## 2021-12-17 MED ORDER — NITROFURANTOIN MONOHYD MACRO 100 MG PO CAPS: 100.0000 mg | ORAL_CAPSULE | Freq: Two times a day (BID) | ORAL | 0 refills | Status: DC
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
## 2021-12-17 NOTE — Patient Instructions (Signed)
Center for [facility] at [facility], [facility], [facility] [address] [place], [place] [address] [phone] (main office) [phone] [facility] office)   [facility]:  [address] 2nd floor, [place], [place] [address] [phone])  Main line [phone]  [place] location **Parking is available in the [address] parking deck.  [place]:  [address] [place], [place] [address] [phone]) Main line [phone]  [place] location  **Located on the backside of the [facility]. Limited parking is available off of [address]  [place] hours: Monday -Friday 8:30am-4:30pm  [email or web address]    If immediate emergency, call 9-1-1, or [facility] [place] 24 hour crisis hotline at: [phone]

## 2021-12-20 LAB — URINE CULTURE

## 2021-12-21 NOTE — BH Specialist Note (Unsigned)
Pt did not arrive to video visit and did not answer the phone; mobile phone [phone] says "no longer in service"; called home number [phone] and Left HIPPA-compliant message to call back Asher Muir from Center for [employer] at [facility] at  [phone] [employer] office).  ; left MyChart message for patient.

## 2021-12-24 ENCOUNTER — Ambulatory Visit: Payer: Medicaid Other | Admitting: Clinical

## 2021-12-24 DIAGNOSIS — Z91199 Patient's noncompliance with other medical treatment and regimen due to unspecified reason: Secondary | ICD-10-CM

## 2022-01-26 ENCOUNTER — Other Ambulatory Visit: Payer: Self-pay

## 2022-01-26 ENCOUNTER — Ambulatory Visit (INDEPENDENT_AMBULATORY_CARE_PROVIDER_SITE_OTHER): Payer: Medicaid Other | Admitting: Obstetrics and Gynecology

## 2022-01-26 ENCOUNTER — Encounter: Payer: Self-pay | Admitting: Obstetrics and Gynecology

## 2022-01-26 VITALS — BP 118/75 | HR 66 | Wt 120.8 lb

## 2022-01-26 DIAGNOSIS — Z3042 Encounter for surveillance of injectable contraceptive: Secondary | ICD-10-CM

## 2022-01-26 MED ORDER — MEDROXYPROGESTERONE ACETATE 150 MG/ML IM SUSP
150.0000 mg | Freq: Once | INTRAMUSCULAR | Status: AC
  Administered 2022-01-26: 150 mg via INTRAMUSCULAR

## 2022-01-26 MED ORDER — NORETHINDRONE 0.35 MG PO TABS: 1.0000 | ORAL_TABLET | Freq: Every day | ORAL | 0 refills | Status: DC
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
## 2022-04-29 ENCOUNTER — Ambulatory Visit: Payer: Medicaid Other | Admitting: Family Medicine

## 2022-05-03 ENCOUNTER — Telehealth: Payer: Self-pay | Admitting: Obstetrics and Gynecology

## 2022-05-03 NOTE — Telephone Encounter (Signed)
Attempted to reach patient via telephone. After speaking to Tracy Foley, she said patient can be scheduled for a pregnancy test and depo injection as long as its been not had unprotected sex. Was not able to leave a voicemail because her mailbox has not been set-up.

## 2022-05-10 ENCOUNTER — Other Ambulatory Visit: Payer: Self-pay

## 2022-05-10 ENCOUNTER — Ambulatory Visit (INDEPENDENT_AMBULATORY_CARE_PROVIDER_SITE_OTHER): Payer: Medicaid Other | Admitting: *Deleted

## 2022-05-10 VITALS — BP 117/86 | HR 98 | Ht 59.0 in | Wt 124.8 lb

## 2022-05-10 DIAGNOSIS — R300 Dysuria: Secondary | ICD-10-CM

## 2022-05-10 DIAGNOSIS — Z3042 Encounter for surveillance of injectable contraceptive: Secondary | ICD-10-CM

## 2022-05-10 LAB — POCT PREGNANCY, URINE: Preg Test, Ur: NEGATIVE

## 2022-05-10 MED ORDER — PHENAZOPYRIDINE HCL 200 MG PO TABS: 200.0000 mg | ORAL_TABLET | Freq: Three times a day (TID) | ORAL | 0 refills | Status: AC

## 2022-05-10 MED ORDER — NITROFURANTOIN MONOHYD MACRO 100 MG PO CAPS: 100.0000 mg | ORAL_CAPSULE | Freq: Two times a day (BID) | ORAL | 0 refills | Status: AC

## 2022-05-10 NOTE — Progress Notes (Signed)
Here for depo-provera. It has been 47w6days since last depo-provera . UPT was negative but patient reports recent unprotected intercourse 5/1.Per protocol  I explained we recommend abstaining from intercourse for 14 days from last unprotected intercourse and then repeat pregnancy test and if negative , can have depo-provera then. Scheduled nurse visit for 05/19/22 for repeat upt and depo. I also explained if she gets her period before then she can call us and we can do pregnancy test and depo sooner. She also c/o burning pain with urination and had recent UTI in December.  UA obtained and + for blood. Reports not on her period now. Urine sent for culture. I explained since she is having pain per protocol I can go ahead and send in prescriptions for macrobid and pyridium but if cultures indicate other antibiotic is better that we may contact her with instructions to take another antibiotic. She voices understanding and would like to go ahead and have the prescriptions sent in. Discussed getting repeat UA 2 weeks after finishes antibiotics to make sure UTI is gone this time.  Nancy Fetter

## 2022-05-12 LAB — URINE CULTURE

## 2022-05-19 ENCOUNTER — Other Ambulatory Visit: Payer: Self-pay

## 2022-05-19 ENCOUNTER — Ambulatory Visit (INDEPENDENT_AMBULATORY_CARE_PROVIDER_SITE_OTHER): Payer: Medicaid Other

## 2022-05-19 VITALS — BP 109/75 | HR 89 | Ht 59.0 in | Wt 123.6 lb

## 2022-05-19 DIAGNOSIS — Z3042 Encounter for surveillance of injectable contraceptive: Secondary | ICD-10-CM | POA: Diagnosis not present

## 2022-05-19 LAB — POCT PREGNANCY, URINE: Preg Test, Ur: NEGATIVE

## 2022-05-19 IMAGING — DX DG CHEST 1V PORT
1 series · 1 of 1 positions shown · non-contrast
Comparison: None.

CLINICAL DATA: Seizure, altered level of consciousness,
unresponsive

EXAM:
PORTABLE CHEST 1 VIEW

[chest ap]
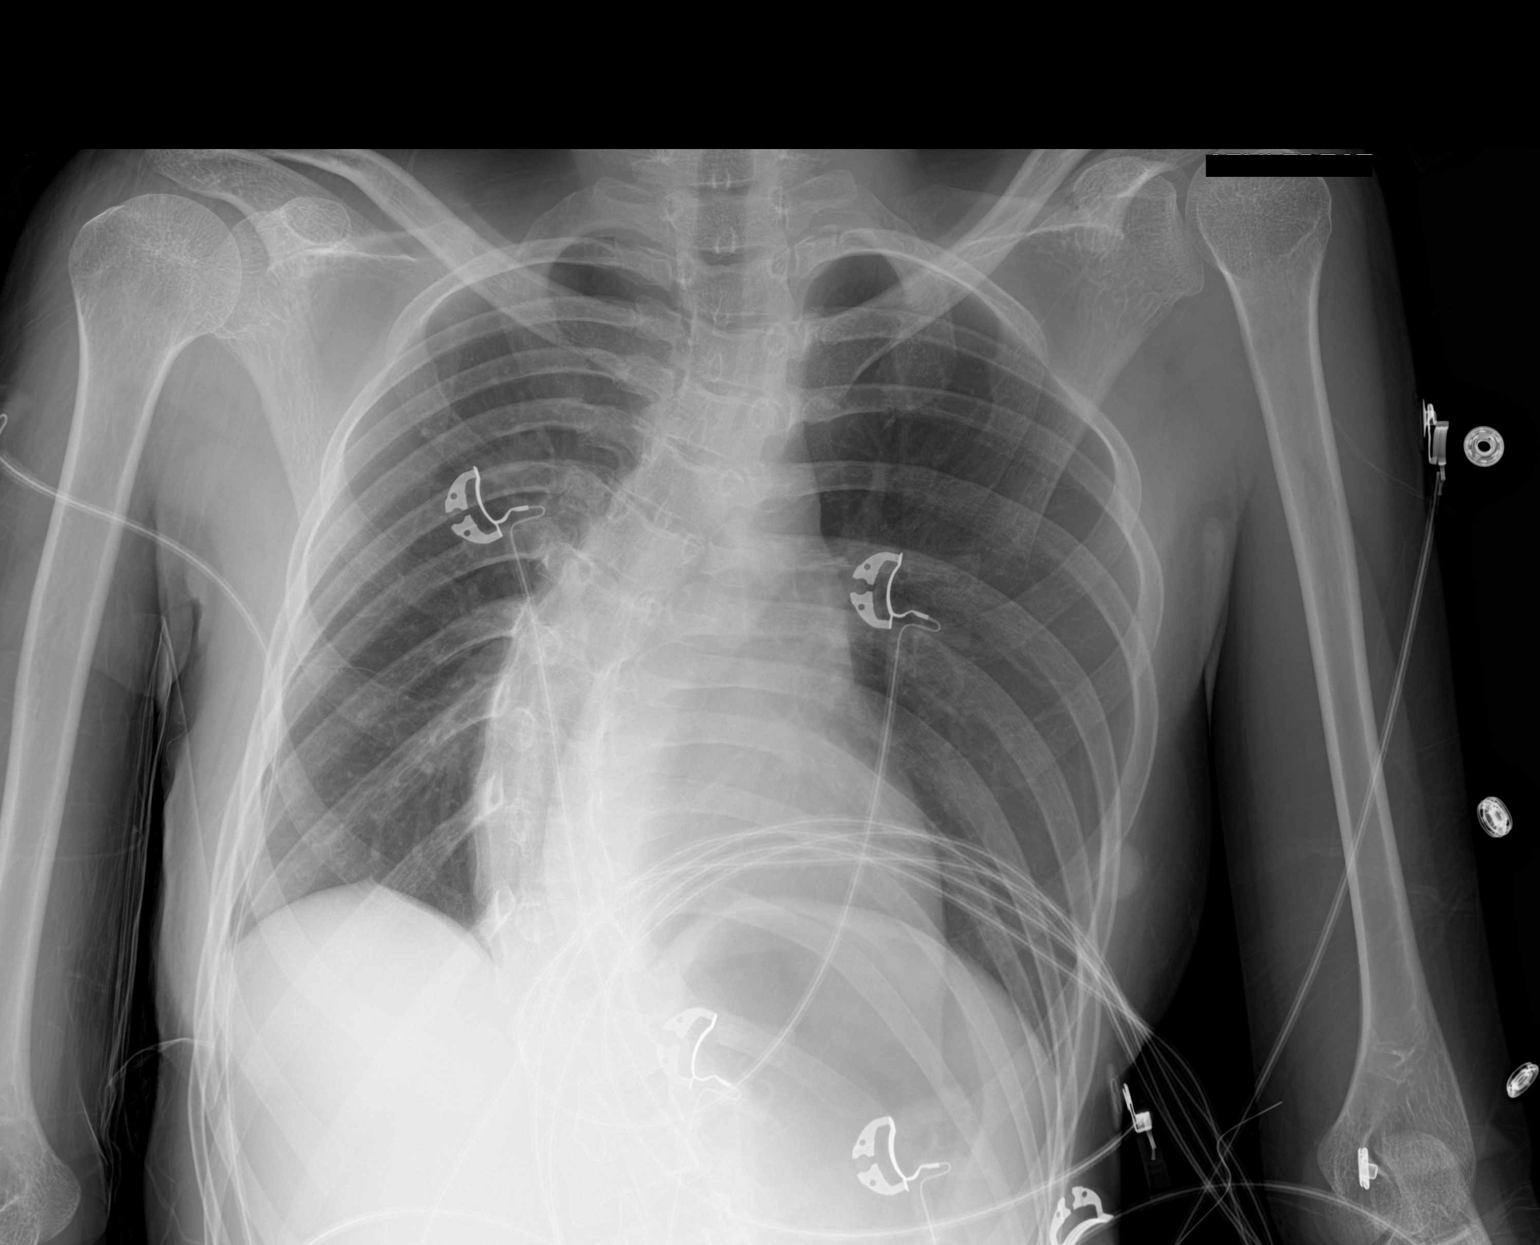

[1 of 1 positions shown; findings below may reference images not displayed]

FINDINGS: Single frontal view of the chest demonstrates an unremarkable
cardiac silhouette. No acute airspace disease, effusion, or
pneumothorax. Marked right convex thoracic scoliosis.
IMPRESSION: 1. No acute intrathoracic process.

## 2022-05-19 IMAGING — CT CT HEAD W/O CM
3 series · 15 of 47 positions shown, 18 images · non-contrast
Comparison: None.

CLINICAL DATA: Atraumatic seizure

EXAM:
CT HEAD WITHOUT CONTRAST
TECHNIQUE: Contiguous axial images were obtained from the base of the skull
through the vertex without intravenous contrast.

[Series 2: head wo · axial · 0.45mm/px · z∈[-179,-54]mm · 9 of 30 slices shown, 12 images]
[im 3/30  brain]
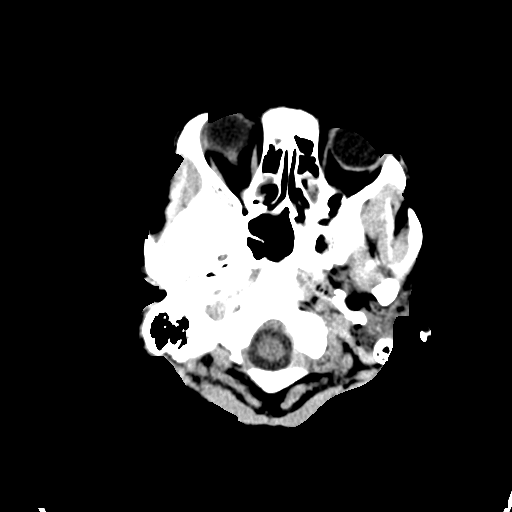
[im 3/30  bone]
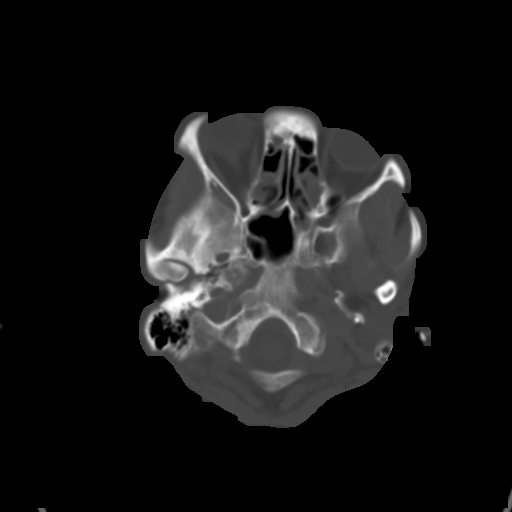
[im 6/30  brain]
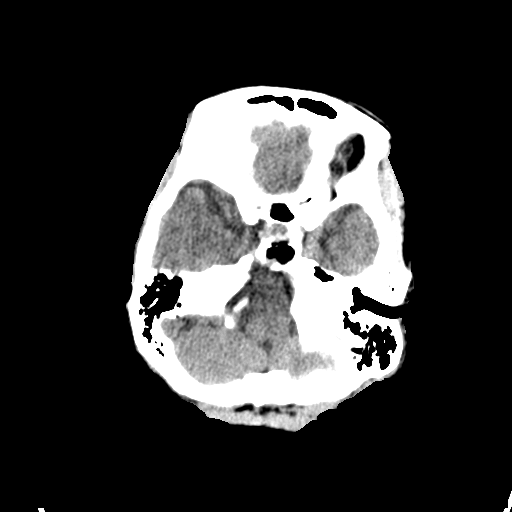
[im 9/30  brain]
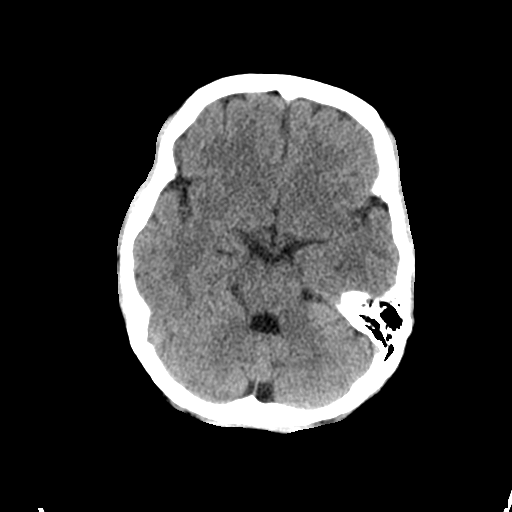
[im 12/30  brain]
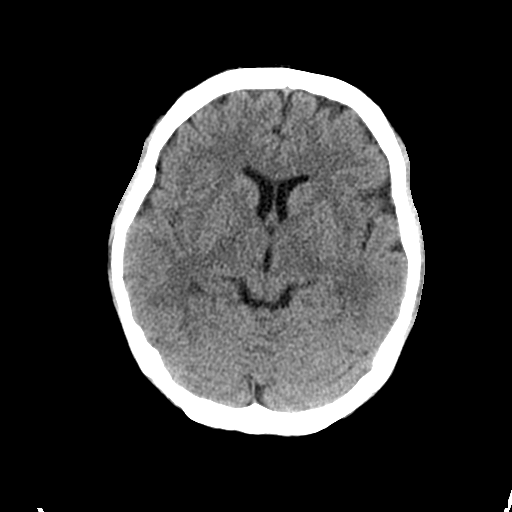
[im 16/30  brain]
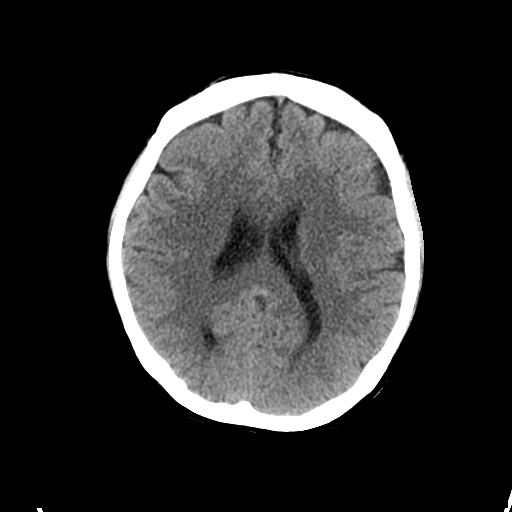
[im 16/30  bone]
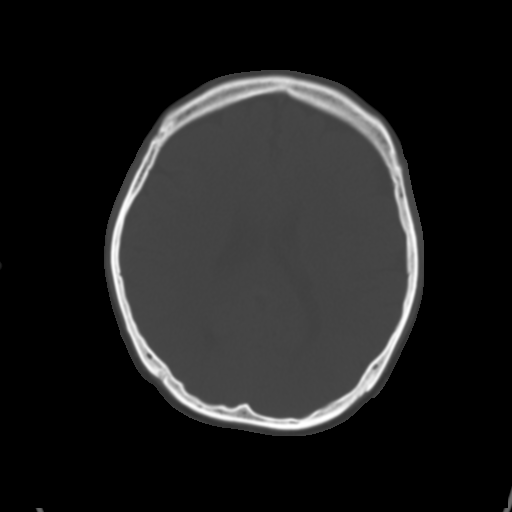
[im 19/30  brain]
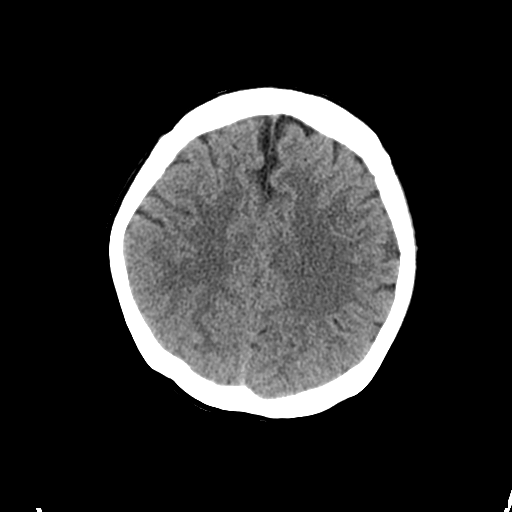
[im 22/30  brain]
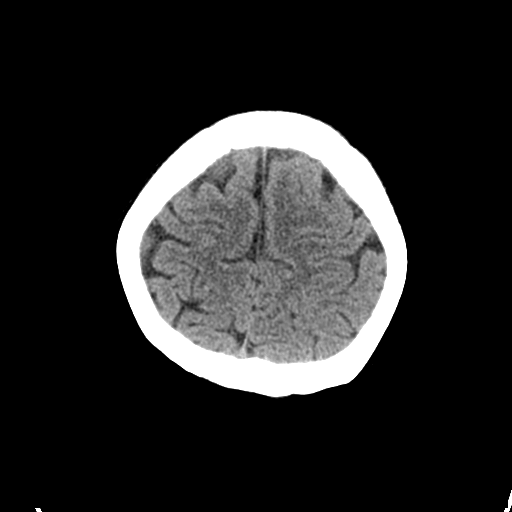
[im 25/30  brain]
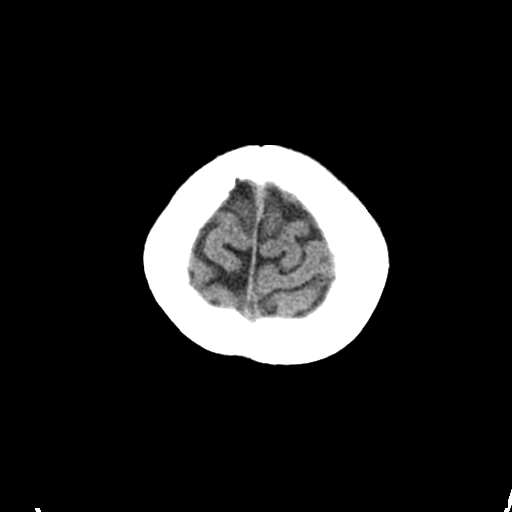
[im 28/30  brain]
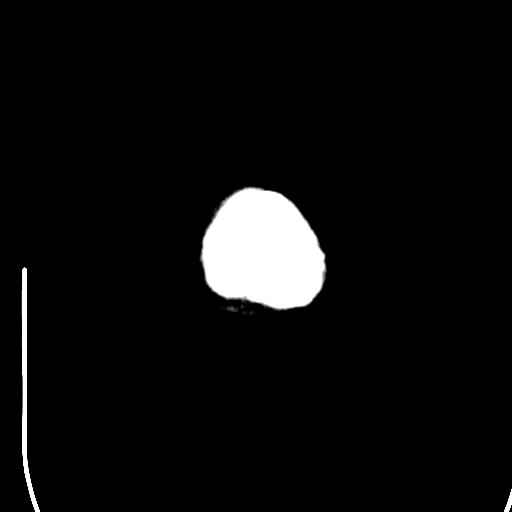
[im 28/30  bone]
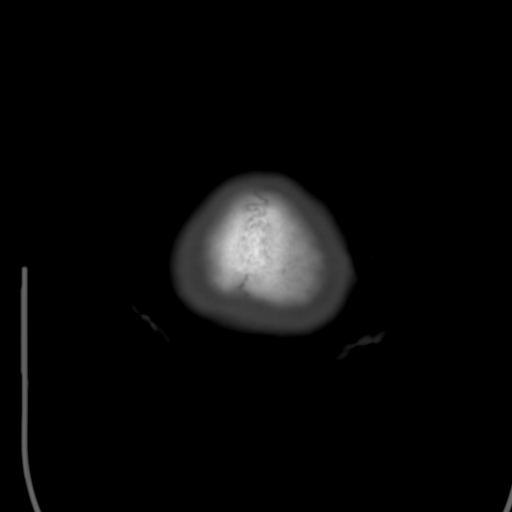

[Series 4: coronal soft tissue · coronal · 0.32mm/px · 3 of 59 slices shown]
[im 20/59  brain]
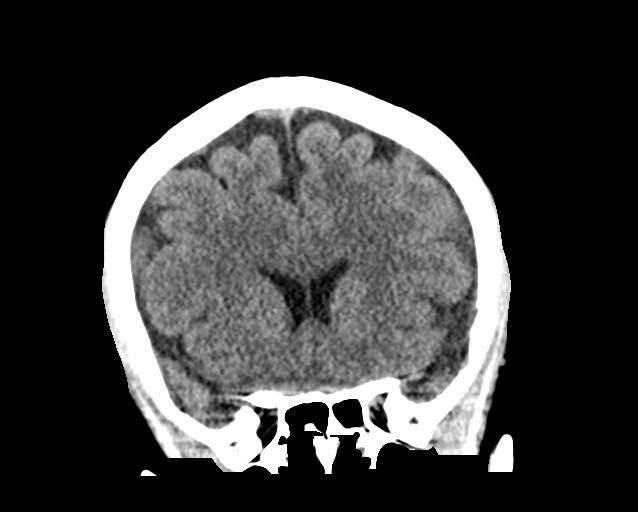
[im 26/59  brain]
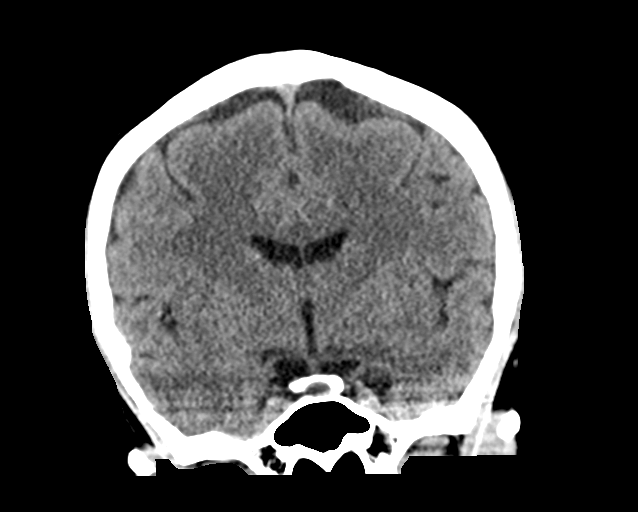
[im 33/59  brain]
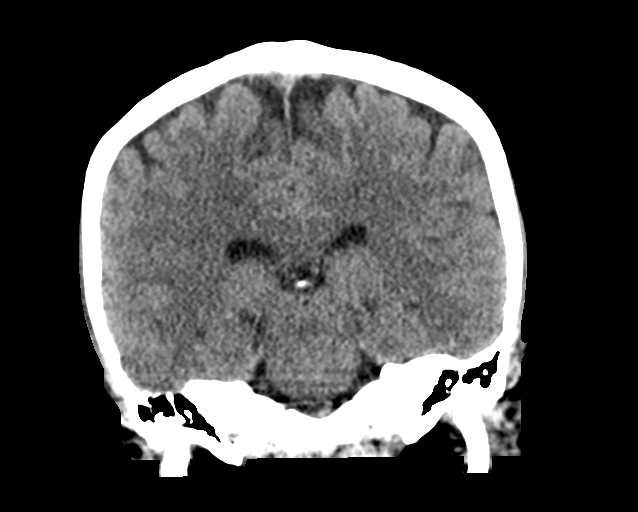

[Series 5: sagittal soft tissue · sagittal · 0.33mm/px · 3 of 51 slices shown]
[im 17/51  brain]
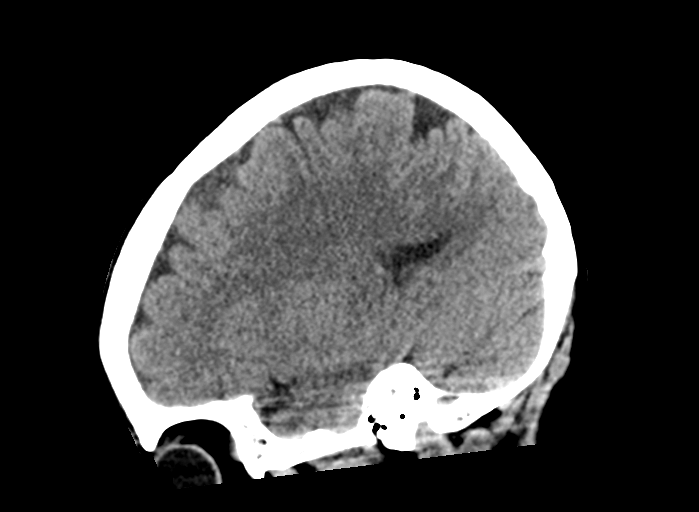
[im 26/51  brain]
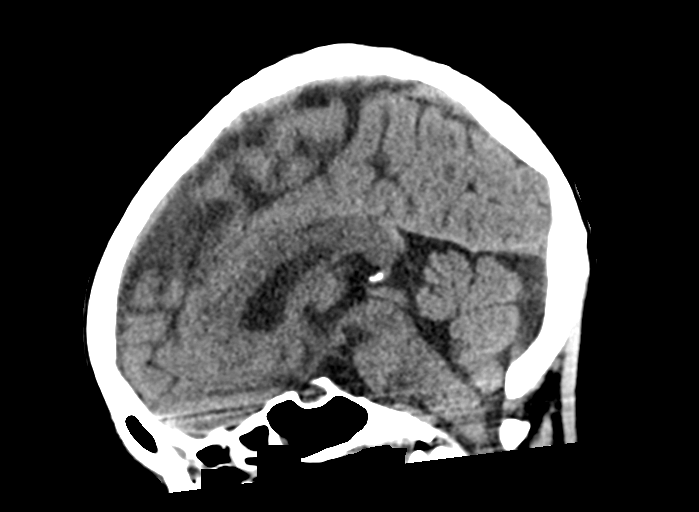
[im 34/51  brain]
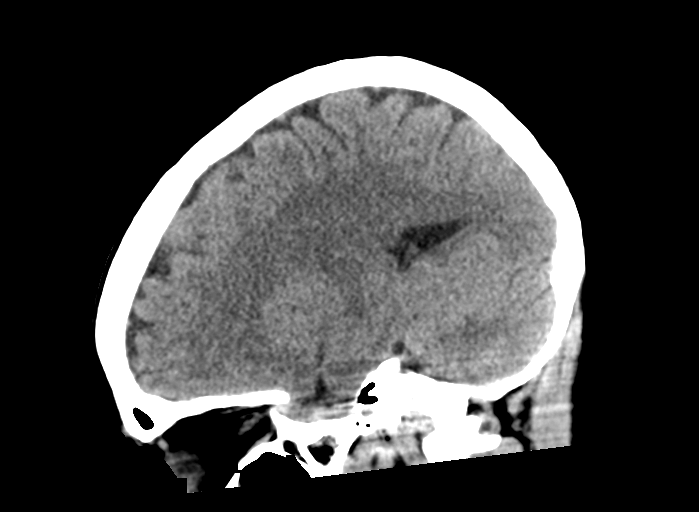

[15 of 47 positions shown; findings below may reference images not displayed]

FINDINGS: Brain: Normal anatomic configuration. No abnormal intra or
extra-axial mass lesion or fluid collection. No abnormal mass effect
or midline shift. No evidence of acute intracranial hemorrhage or
infarct. Ventricular size is normal. Cerebellum unremarkable.

Vascular: Unremarkable

Skull: Intact

Sinuses/Orbits: Paranasal sinuses are clear. Orbits are
unremarkable.

Other: Mastoid air cells and middle ear cavities are clear.
IMPRESSION: No acute intracranial abnormality.  Normal exam.

## 2022-05-19 MED ORDER — MEDROXYPROGESTERONE ACETATE 150 MG/ML IM SUSY
150.0000 mg | PREFILLED_SYRINGE | Freq: Once | INTRAMUSCULAR | Status: AC
  Administered 2022-05-19: 150 mg via INTRAMUSCULAR

## 2022-05-19 NOTE — Progress Notes (Signed)
Tracy Foley here for Depo-Provera Injection. Patient reports no unprotected sex in the last two weeks; UPT done in office--negative. Injection administered without complication. Patient will return in 3 months for next injection between 7/31 and 8/14. Next annual visit due 01/27/2023.   Meryl Crutch, RN 05/19/2022  10:15 AM

## 2022-05-19 NOTE — Patient Instructions (Signed)
Next Depo shot due between July 31st and August 14th.

## 2022-05-28 NOTE — Congregational Nurse Program (Signed)
Member attended "Love your brain" class.  She was alert, participating, added to discussion.  Some discussion centered around smoking, health concerns.  Discussion of how distracting and difficulty to focus with several children to care for.    [name], RN  Congregational Nursing  [phone]

## 2022-06-06 ENCOUNTER — Ambulatory Visit (HOSPITAL_COMMUNITY)
Admission: EM | Admit: 2022-06-06 | Discharge: 2022-06-06 | Disposition: A | Discharge status: Home / Self Care | Payer: Medicaid Other

## 2022-06-06 DIAGNOSIS — F411 Generalized anxiety disorder: Secondary | ICD-10-CM

## 2022-06-06 DIAGNOSIS — F331 Major depressive disorder, recurrent, moderate: Secondary | ICD-10-CM
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
## 2022-06-06 NOTE — Discharge Instructions (Signed)
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
## 2022-07-04 NOTE — Congregational Nurse Program (Signed)
Member seen at [facility].  Attended a first aid workshop and took home a first aid kit.  Participated in workshop discussion.  Favor Hackler, RN  Congregational Nursing  [phone]    

## 2022-07-06 ENCOUNTER — Telehealth: Payer: Self-pay

## 2022-07-06 NOTE — Telephone Encounter (Signed)
TC to client to discuss any followup needed from last visit to [facility].  No answer, left message to call me.  [name], RN  Congregational Nursing  [phone]
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
## 2022-07-17 ENCOUNTER — Telehealth: Payer: Self-pay

## 2022-07-17 NOTE — Telephone Encounter (Signed)
Call to check on patient after seizure on Thurs. 07/15/22. No answer, left message and my number to call. [name], RN, Congregational Nurse, [phone].      Visit Information

## 2022-08-04 ENCOUNTER — Ambulatory Visit: Payer: Medicaid Other

## 2022-08-05 ENCOUNTER — Ambulatory Visit (INDEPENDENT_AMBULATORY_CARE_PROVIDER_SITE_OTHER): Payer: Medicaid Other

## 2022-08-05 ENCOUNTER — Other Ambulatory Visit: Payer: Self-pay

## 2022-08-05 VITALS — BP 117/76 | HR 89 | Ht 59.0 in | Wt 119.3 lb

## 2022-08-05 DIAGNOSIS — Z3042 Encounter for surveillance of injectable contraceptive: Secondary | ICD-10-CM | POA: Diagnosis not present

## 2022-08-05 MED ORDER — MEDROXYPROGESTERONE ACETATE 150 MG/ML IM SUSY
150.0000 mg | PREFILLED_SYRINGE | Freq: Once | INTRAMUSCULAR | Status: AC
  Administered 2022-08-05: 150 mg via INTRAMUSCULAR

## 2022-08-05 NOTE — Progress Notes (Signed)
Tracy Foley here for Depo-Provera Injection. Injection administered without complication. Patient will return in 3 months for next injection between October 17 and October 31. Next annual visit due 11/2022.   Ralene Bathe, RN 08/05/2022  3:26 PM

## 2022-08-18 ENCOUNTER — Other Ambulatory Visit: Payer: Self-pay | Admitting: Obstetrics and Gynecology

## 2022-08-30 ENCOUNTER — Ambulatory Visit: Payer: Self-pay | Admitting: Nurse Practitioner

## 2022-09-09 ENCOUNTER — Ambulatory Visit: Payer: Self-pay | Admitting: Nurse Practitioner

## 2022-10-28 ENCOUNTER — Other Ambulatory Visit: Payer: Self-pay

## 2022-10-28 ENCOUNTER — Ambulatory Visit: Payer: Medicaid Other

## 2022-10-28 VITALS — BP 105/75 | HR 74 | Ht 59.0 in | Wt 125.0 lb

## 2022-10-28 DIAGNOSIS — Z3042 Encounter for surveillance of injectable contraceptive: Secondary | ICD-10-CM

## 2022-10-28 MED ORDER — MEDROXYPROGESTERONE ACETATE 150 MG/ML IM SUSY
150.0000 mg | PREFILLED_SYRINGE | Freq: Once | INTRAMUSCULAR | Status: AC
  Administered 2022-10-28: 150 mg via INTRAMUSCULAR

## 2022-10-28 NOTE — Progress Notes (Signed)
Ezequiel Kayser here for Depo-Provera Injection. Injection administered without complication. Patient will return in 3 months for next injection between January 9 and January 23. Next annual visit due 12/2022.   Ralene Bathe, RN 10/28/2022  3:33 PM

## 2022-10-29 ENCOUNTER — Encounter: Payer: Self-pay | Admitting: Family Medicine

## 2022-10-29 DIAGNOSIS — L309 Dermatitis, unspecified: Secondary | ICD-10-CM

## 2022-10-29 MED ORDER — TRIAMCINOLONE ACETONIDE 0.025 % EX OINT: 1.0000 | TOPICAL_OINTMENT | Freq: Two times a day (BID) | CUTANEOUS | 0 refills | Status: DC

## 2022-12-22 ENCOUNTER — Telehealth: Payer: Self-pay

## 2022-12-22 NOTE — Telephone Encounter (Signed)
Sent [facility] schedule to access, sent phone number for the appt for pcp, reported she had "popping in ear".  No previous noted.  Referred to PCP.    [name], RN  Congregational Nursing  [phone] Neva Seat, RN  Congregational Nursing  [phone]

## 2023-01-05 NOTE — L&D Delivery Note (Signed)
 OB/GYN Faculty Practice Delivery Note  Tracy Foley is a 28 y.o. [id number] s/p SVD at [redacted]w[redacted]d. She was admitted for PPROM with IUFD.   ROM: 12h 26m  GBS Status: unknown Maximum Maternal Temperature: 99.67F  Labor Progress: Initial SSE dilated with prolapsing membranes, augmented with cytotec  Delivery Date/Time: 10/14/23 2044 Delivery: Called to room and patient was having rectal pressure. While provider was putting on gloves, fetus expulsed from vagina. Cord clamped and cut by provider. Placenta delivered spontaneously, intact. Fundus firm with massage. Labia, perineum, vagina, and cervix inspected, no lacerations found. Given methergine for bleeding. Fetus wrapped in blanket and hat, placed on warmer. Patient would like time to rest prior to holding.  Placenta: Intact, delivered spontaneously Complications: PPROM, IUFD Lacerations: none EBL: Analgesia: Epidural   Infant: Nonviable infant, unknown sex  15g  [name], MD 10/14/2023, 9:39 PM

## 2023-01-12 ENCOUNTER — Ambulatory Visit (INDEPENDENT_AMBULATORY_CARE_PROVIDER_SITE_OTHER): Payer: Medicaid Other | Admitting: Family Medicine

## 2023-01-12 ENCOUNTER — Encounter: Payer: Self-pay | Admitting: Family Medicine

## 2023-01-12 ENCOUNTER — Ambulatory Visit (INDEPENDENT_AMBULATORY_CARE_PROVIDER_SITE_OTHER): Payer: Medicaid Other

## 2023-01-12 VITALS — BP 112/76 | HR 90 | Temp 98.3°F | Ht 59.0 in | Wt 127.0 lb

## 2023-01-12 DIAGNOSIS — R052 Subacute cough: Secondary | ICD-10-CM | POA: Diagnosis not present

## 2023-01-12 DIAGNOSIS — M4125 Other idiopathic scoliosis, thoracolumbar region: Secondary | ICD-10-CM | POA: Diagnosis not present

## 2023-01-12 DIAGNOSIS — S99921A Unspecified injury of right foot, initial encounter: Secondary | ICD-10-CM | POA: Diagnosis not present

## 2023-01-12 DIAGNOSIS — M546 Pain in thoracic spine: Secondary | ICD-10-CM | POA: Diagnosis not present

## 2023-01-12 DIAGNOSIS — G40909 Epilepsy, unspecified, not intractable, without status epilepticus: Secondary | ICD-10-CM

## 2023-01-12 DIAGNOSIS — G8929 Other chronic pain: Secondary | ICD-10-CM

## 2023-01-12 DIAGNOSIS — F32A Depression, unspecified: Secondary | ICD-10-CM

## 2023-01-12 DIAGNOSIS — R0981 Nasal congestion: Secondary | ICD-10-CM | POA: Diagnosis not present

## 2023-01-12 MED ORDER — FLUTICASONE PROPIONATE 50 MCG/ACT NA SUSP: 2.0000 | Freq: Every day | NASAL | 6 refills | Status: DC

## 2023-01-12 MED ORDER — LEVOCETIRIZINE DIHYDROCHLORIDE 5 MG PO TABS: 5.0000 mg | ORAL_TABLET | Freq: Every evening | ORAL | 2 refills | Status: DC

## 2023-01-12 NOTE — Progress Notes (Signed)
 Her X ray is negative. Try using OTC Voltaren gel, taking Tylenol or ibuprofen and if her foot is not improving, please discuss with the orthopedist at her upcoming appt (referred today for back).
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
## 2023-01-12 NOTE — Patient Instructions (Signed)
 Start Xyzal and Flonase for congestion and cough.   Get your foot X ray downstairs before you leave.   Follow up with your OB/GYN office for injections.   Follow up here in 2 weeks.

## 2023-01-14 DIAGNOSIS — G8929 Other chronic pain: Secondary | ICD-10-CM | POA: Insufficient documentation

## 2023-01-14 DIAGNOSIS — M546 Pain in thoracic spine: Secondary | ICD-10-CM | POA: Insufficient documentation

## 2023-01-14 DIAGNOSIS — M4125 Other idiopathic scoliosis, thoracolumbar region: Secondary | ICD-10-CM | POA: Insufficient documentation

## 2023-01-20 ENCOUNTER — Ambulatory Visit: Payer: Medicaid Other

## 2023-01-25 ENCOUNTER — Ambulatory Visit: Payer: Medicaid Other

## 2023-01-26 ENCOUNTER — Ambulatory Visit: Payer: Medicaid Other | Admitting: Family Medicine

## 2023-02-09 ENCOUNTER — Other Ambulatory Visit: Payer: Self-pay

## 2023-02-09 ENCOUNTER — Emergency Department (HOSPITAL_COMMUNITY)
Admission: EM | Admit: 2023-02-09 | Discharge: 2023-02-09 | Disposition: A | Discharge status: Home / Self Care | Payer: Medicaid Other | Attending: Emergency Medicine | Admitting: Emergency Medicine

## 2023-02-09 DIAGNOSIS — N3001 Acute cystitis with hematuria: Secondary | ICD-10-CM | POA: Diagnosis not present

## 2023-02-09 DIAGNOSIS — Z760 Encounter for issue of repeat prescription: Secondary | ICD-10-CM | POA: Insufficient documentation

## 2023-02-09 DIAGNOSIS — R569 Unspecified convulsions: Secondary | ICD-10-CM | POA: Insufficient documentation

## 2023-02-09 LAB — BASIC METABOLIC PANEL
Anion gap: 14 (ref 5–15)
BUN: 7 mg/dL (ref 6–20)
CO2: 20 mmol/L — ABNORMAL LOW (ref 22–32)
Calcium: 9.2 mg/dL (ref 8.9–10.3)
Chloride: 109 mmol/L (ref 98–111)
Creatinine, Ser: 0.54 mg/dL (ref 0.44–1.00)
GFR, Estimated: >60 mL/min (ref >60)
Glucose, Bld: 105 mg/dL — ABNORMAL HIGH (ref 70–99)
Potassium: 3.8 mmol/L (ref 3.5–5.1)
Sodium: 143 mmol/L (ref 135–145)

## 2023-02-09 LAB — URINALYSIS, W/ REFLEX TO CULTURE (INFECTION SUSPECTED)
Bilirubin Urine: NEGATIVE
Glucose, UA: NEGATIVE mg/dL
Ketones, ur: NEGATIVE mg/dL
Nitrite: POSITIVE — AB
Protein, ur: NEGATIVE mg/dL
Specific Gravity, Urine: 1.015 (ref 1.005–1.030)
pH: 6 (ref 5.0–8.0)

## 2023-02-09 LAB — CBC WITH DIFFERENTIAL/PLATELET
Abs Immature Granulocytes: 0.03 10*3/uL (ref 0.00–0.07)
Basophils Absolute: 0 10*3/uL (ref 0.0–0.1)
Basophils Relative: 1 %
Eosinophils Absolute: 0.1 10*3/uL (ref 0.0–0.5)
Eosinophils Relative: 1 %
HCT: 43.7 % (ref 36.0–46.0)
Hemoglobin: 14.3 g/dL (ref 12.0–15.0)
Immature Granulocytes: 0 %
Lymphocytes Relative: 17 %
Lymphs Abs: 1.4 10*3/uL (ref 0.7–4.0)
MCH: 28.8 pg (ref 26.0–34.0)
MCHC: 32.7 g/dL (ref 30.0–36.0)
MCV: 88.1 fL (ref 80.0–100.0)
Monocytes Absolute: 0.4 10*3/uL (ref 0.1–1.0)
Monocytes Relative: 4 %
Neutro Abs: 6.2 10*3/uL (ref 1.7–7.7)
Neutrophils Relative %: 77 %
Platelets: 330 10*3/uL (ref 150–400)
RBC: 4.96 MIL/uL (ref 3.87–5.11)
RDW: 13.4 % (ref 11.5–15.5)
WBC: 8.2 10*3/uL (ref 4.0–10.5)
nRBC: 0 % (ref 0.0–0.2)

## 2023-02-09 LAB — MAGNESIUM: Magnesium: 2.1 mg/dL (ref 1.7–2.4)

## 2023-02-09 LAB — HCG, SERUM, QUALITATIVE: Preg, Serum: NEGATIVE

## 2023-02-09 MED ORDER — NITROFURANTOIN MONOHYD MACRO 100 MG PO CAPS: 100.0000 mg | ORAL_CAPSULE | Freq: Two times a day (BID) | ORAL | 0 refills | Status: DC

## 2023-02-09 MED ORDER — LEVETIRACETAM IN NACL 1500 MG/100ML IV SOLN
1500.0000 mg | Freq: Once | INTRAVENOUS | Status: AC
  Administered 2023-02-09: 1500 mg via INTRAVENOUS
  Filled 2023-02-09: qty 100

## 2023-02-09 MED ORDER — SODIUM CHLORIDE 0.9 % IV BOLUS
1000.0000 mL | Freq: Once | INTRAVENOUS | Status: AC
  Administered 2023-02-09: 1000 mL via INTRAVENOUS

## 2023-02-09 MED ORDER — LEVETIRACETAM 500 MG PO TABS: 500.0000 mg | ORAL_TABLET | Freq: Two times a day (BID) | ORAL | 1 refills | Status: DC

## 2023-02-09 NOTE — ED Provider Notes (Addendum)
 [place] EMERGENCY DEPARTMENT AT [facility] Provider Note   CSN: [id number] Arrival date & time: 02/09/23  1527     History  Chief Complaint  Patient presents with   Seizures    Tracy Foley is a 28 y.o. female here for evaluation of seizure-like activity.  EMS called by family, witnessed seizure.  Did not fall to the ground.  She has been otherwise well.  She apparently stopped taking her Keppra  a few days ago.  Unclear as to why.  Patient had another episode of seizure-like activity with them subsequently gave Versed .  They stated she had a GCS of 3 however on arrival she is protecting her airway, awakens to painful stimuli, nonverbal, appears postictal however can nod yes or no. According to EMS no recent illnesses  HPI     Home Medications Prior to Admission medications   Medication Sig Start Date End Date Taking? Authorizing Provider  levETIRAcetam  (KEPPRA ) 500 MG tablet Take 1 tablet (500 mg total) by mouth 2 (two) times daily. 02/09/23  Yes Jaymison Luber A, PA-C  fluticasone  (FLONASE ) 50 MCG/ACT nasal spray Place 2 sprays into both nostrils daily. 01/12/23   Henson, Vickie L, NP-C  levocetirizine (XYZAL ) 5 MG tablet Take 1 tablet (5 mg total) by mouth every evening. 01/12/23   Henson, Vickie L, NP-C  medroxyPROGESTERone  (DEPO-PROVERA ) 150 MG/ML injection Inject 150 mg into the muscle every 3 (three) months.    [provider]  triamcinolone  (KENALOG ) 0.025 % ointment Apply 1 Application topically 2 (two) times daily. 10/29/22   Henson, Boby CROME, NP-C      Allergies    Patient has no known allergies.    Review of Systems   Review of Systems  Constitutional: Negative.   HENT: Negative.    Respiratory: Negative.    Cardiovascular: Negative.   Gastrointestinal: Negative.   Genitourinary: Negative.   Musculoskeletal: Negative.   Skin: Negative.   Neurological:  Positive for seizures.  All other systems reviewed and are negative.   Physical  Exam Updated Vital Signs BP 112/78   Pulse 100   Temp 98.4 F (36.9 C) (Oral)   Resp 15   SpO2 99%  Physical Exam Vitals and nursing note reviewed.  Constitutional:      General: She is not in acute distress.    Appearance: She is well-developed. She is not ill-appearing.  HENT:     Head: Atraumatic.     Mouth/Throat:     Lips: Pink.     Mouth: Mucous membranes are moist.     Pharynx: Oropharynx is clear. Uvula midline.     Comments: No mouth or tongue injury Eyes:     Pupils: Pupils are equal, round, and reactive to light.  Cardiovascular:     Rate and Rhythm: Normal rate.     Pulses: Normal pulses.     Heart sounds: Normal heart sounds.  Pulmonary:     Effort: Pulmonary effort is normal. No respiratory distress.     Breath sounds: Normal breath sounds.  Abdominal:     General: Bowel sounds are normal. There is no distension.     Palpations: Abdomen is soft.  Musculoskeletal:        General: Normal range of motion.     Cervical back: Normal range of motion.  Skin:    General: Skin is warm and dry.  Neurological:     Mental Status: She is alert.     Comments: Sleepy, arousable to verbal stimuli, looks  around room.  No seizure-like activity or tremors.  Spontaneously moves all 4 extremities without difficulty.  No obvious facial droop.  Tongue midline.    ED Results / Procedures / Treatments   Labs (all labs ordered are listed, but only abnormal results are displayed) Labs Reviewed  BASIC METABOLIC PANEL - Abnormal; Notable for the following components:      Result Value   CO2 20 (*)    Glucose, Bld 105 (*)    All other components within normal limits  CBC WITH DIFFERENTIAL/PLATELET  MAGNESIUM   HCG, SERUM, QUALITATIVE  URINALYSIS, W/ REFLEX TO CULTURE (INFECTION SUSPECTED)    EKG None  Radiology No results found.  Procedures Procedures    Medications Ordered in ED Medications  levETIRAcetam  (KEPPRA ) IVPB 1500 mg/ 100 mL premix (0 mg Intravenous  Stopped 02/09/23 1733)  sodium chloride  0.9 % bolus 1,000 mL (0 mLs Intravenous Stopped 02/09/23 2105)   ED Course/ Medical Decision Making/ A&P Clinical Course as of 02/09/23 2113  Wed Feb 09, 2023  1837 Reassessed. Still sleepy however awakened to voice. Will continue to monitor. [BH]    Clinical Course User Index [BH] Mechell Girgis A, PA-C   28 year old known history of seizures here for evaluation of seizure-like activity.  Apparently had 2 episodes of seizure like activity, 1 witnessed with EMS.  Subsequently given Versed .  On arrival to the ED she is postictal.  Per EMS family states she did not fall to the ground, no head injury.  She was in her normal state of health prior to seizure.  Sounds like she stopped taking her Keppra .  Unclear why.  She has no traumatic injuries on exam.  Will plan on labs, give Keppra  bolus and reassess  Labs and imaging personally viewed interpreted No significant abnormality  Patient does state that she was stretching at her Keppra  as she does not have an appointment with neurology until March to establish care.  Her prescription was from July.  I have refilled her Keppra  she takes 500 twice daily.  I encouraged her to keep her neurology appointment.  Discussed no driving according to [place]  [place].  Will have her follow-up outpatient, return for any worsening symptoms.  I suspect her seizure likely due to medication noncompliance. UA positive for nitrites, leuks, WBC 21-50.  Started on Macrobid .  The patient has been appropriately medically screened and/or stabilized in the ED. I have low suspicion for any other emergent medical condition which would require further screening, evaluation or treatment in the ED or require inpatient management.  Patient is hemodynamically stable and in no acute distress.  Patient able to ambulate in department prior to ED.  Evaluation does not show acute pathology that would require ongoing or additional emergent  interventions while in the emergency department or further inpatient treatment.  I have discussed the diagnosis with the patient and answered all questions.  Pain is been managed while in the emergency department and patient has no further complaints prior to discharge.  Patient is comfortable with plan discussed in room and is stable for discharge at this time.  I have discussed strict return precautions for returning to the emergency department.  Patient was encouraged to follow-up with PCP/specialist refer to at discharge.                                 Medical Decision Making Amount and/or Complexity of Data Reviewed Independent Historian: friend  and EMS External Data Reviewed: labs, radiology and notes. Labs: ordered. Decision-making details documented in ED Course.  Risk OTC drugs. Prescription drug management. Decision regarding hospitalization.    Final Clinical Impression(s) / ED Diagnoses Final diagnoses:  Seizure (HCC)  Medication refill    Rx / DC Orders ED Discharge Orders          Ordered    levETIRAcetam  (KEPPRA ) 500 MG tablet  2 times daily        02/09/23 2111                Tinamarie Przybylski A, PA-C 02/09/23 2145    Jerral Meth, MD 02/09/23 2230

## 2023-02-09 NOTE — ED Triage Notes (Signed)
 Pt arrived via [facility] from home for witnessed seizure by family and one with EMS. Pt recently stopped taking seizure medication. Pt was unresponsive upon EMS arrival and remains unresponsive at time of arrival to ED. GCS 3. Pt protecting airway.   5mg  Versed  IM administered  CBG 121 BP 99/50 HR 90 SPO2 100% NR (RA 97%)

## 2023-02-09 NOTE — Discharge Instructions (Addendum)
We have restarted your Keppra medication.  Make sure to keep your follow-up appointment with neurology  No driving anterior seizure-free for 6 months according to the [facility]  Return for new or worsening symptoms

## 2023-02-09 NOTE — ED Notes (Signed)
 Pt d/c home with visitor per EDP order. Discharge summary reviewed, pt verbalizes understanding. NAD.

## 2023-02-09 NOTE — ED Notes (Signed)
 Pt awakened to painful stimuli. Non verbal at this time, slow movements. Pt pointed to head when asked about pain, makes eye contact, nods yes and no.

## 2023-02-09 NOTE — ED Notes (Signed)
Pt tolerated po challenge without complaints

## 2023-02-11 LAB — URINE CULTURE: Culture: >=100000 — AB

## 2023-02-12 ENCOUNTER — Telehealth (HOSPITAL_BASED_OUTPATIENT_CLINIC_OR_DEPARTMENT_OTHER): Payer: Self-pay | Admitting: *Deleted

## 2023-02-12 NOTE — Telephone Encounter (Signed)
 Post ED Visit - Positive Culture Follow-up  Culture report reviewed by antimicrobial stewardship pharmacist: [facility] [address] Pharm.D. []  Venetia Gully, Pharm.D., BCPS AQ-ID []  Garrel Crews, Pharm.D., BCPS []  Almarie Lunger, [address].D., BCPS []  [place], [place].D., BCPS, AAHIVP []  Rosaline Bihari, Pharm.D., BCPS, AAHIVP []  Vernell Meier, PharmD, BCPS []  Latanya Hint, PharmD, BCPS []  Donald Medley, PharmD, BCPS []  Rocky Bold, PharmD []  Dorothyann Alert, PharmD, BCPS [x]  Rankin Silver, PharmD  [facility] []  Rosaline Edison, PharmD []  Romona Bliss, PharmD []  Dolphus Roller, PharmD []  Veva Seip, Rph []  Vernell Daunt) Tracy Foley, PharmD []  Eva Allis, PharmD []  Rosaline Millet, PharmD []  Iantha Batch, PharmD []  Arvin Gauss, PharmD []  Wanda Hasting, PharmD []  Ronal Rav, PharmD []  Rocky Slade, PharmD []  Bard Jeans, PharmD   Positive urine culture Treated with Nitrofurantoin .  Pt returned call. Stated she was doing better but having some mild symptoms off and on. Pt stated she would follow up with her PCP Monday and if she had any worsening symptoms she would return for evaluation [name] 02/12/2023, 5:03 PM

## 2023-02-12 NOTE — Progress Notes (Signed)
 ED Antimicrobial Stewardship Positive Culture Follow Up   Tracy Foley is an 28 y.o. female who presented to [facility] on 02/09/2023 with a chief complaint of  Chief Complaint  Patient presents with   Seizures    Recent Results (from the past 720 hours)  Urine Culture     Status: Abnormal   Collection Time: 02/09/23  8:33 PM   Specimen: Urine, Random  Result Value Ref Range Status   Specimen Description URINE, RANDOM  Final   Special Requests   Final    NONE Reflexed from [id number] Performed at [facility], [address] [place], [place] [address]    Culture >=100,000 COLONIES/mL STAPHYLOCOCCUS AUREUS (A)  Final   Report Status 02/11/2023 FINAL  Final   Organism ID, Bacteria STAPHYLOCOCCUS AUREUS (A)  Final      Susceptibility   Staphylococcus aureus - MIC*    CIPROFLOXACIN  <=0.5 SENSITIVE Sensitive     GENTAMICIN <=0.5 SENSITIVE Sensitive     NITROFURANTOIN  <=16 SENSITIVE Sensitive     OXACILLIN 0.5 SENSITIVE Sensitive     TETRACYCLINE <=1 SENSITIVE Sensitive     VANCOMYCIN 1 SENSITIVE Sensitive     TRIMETH/SULFA <=10 SENSITIVE Sensitive     RIFAMPIN <=0.5 SENSITIVE Sensitive     Inducible Clindamycin POSITIVE Resistant     LINEZOLID 2 SENSITIVE Sensitive     * >=100,000 COLONIES/mL STAPHYLOCOCCUS AUREUS   Patient growing Staphylococcus aureus in the urine. [facility] provider contact Dr. Efrain to review case. Contact patient for symptom check.   New antibiotic prescription: Pending Dr. Efrain review   ED Provider: Nidia Mays, PA-C   [name] 02/12/2023, 8:38 AM Clinical Pharmacist Monday - Friday phone -  [phone] Saturday - Sunday phone - [phone]

## 2023-02-12 NOTE — Telephone Encounter (Signed)
 Post ED Visit - Positive Culture Follow-up: Unsuccessful Patient Follow-up  Culture assessed and recommendations reviewed by:  [x]  [name], Pharm.D. []  Venetia Gully, Pharm.D., BCPS AQ-ID []  Garrel Crews, Pharm.D., BCPS []  Almarie Lunger, Pharm.D., BCPS []  [place], [place].D., BCPS, AAHIVP []  Rosaline Bihari, Pharm.D., BCPS, AAHIVP []  Massie Rigg, PharmD []  Jodie Rower, PharmD, BCPS  Positive urine culture  Pt treated with Nitrofurantoin .  Call for symptom check.  , If asymptomatic, then nothing needed. If having symptoms, could be bacteremia in origin and may need to get evaluated   Unable to contact patient after 3 attempts, letter will be sent to address on file  Tracy Foley 02/12/2023, 10:20 AM

## 2023-02-13 ENCOUNTER — Ambulatory Visit (HOSPITAL_COMMUNITY)
Admission: EM | Admit: 2023-02-13 | Discharge: 2023-02-13 | Disposition: A | Discharge status: Home / Self Care | Payer: Medicaid Other | Attending: Physician Assistant | Admitting: Physician Assistant

## 2023-02-13 ENCOUNTER — Encounter (HOSPITAL_COMMUNITY): Payer: Self-pay

## 2023-02-13 DIAGNOSIS — L239 Allergic contact dermatitis, unspecified cause: Secondary | ICD-10-CM | POA: Diagnosis not present

## 2023-02-13 DIAGNOSIS — N390 Urinary tract infection, site not specified: Secondary | ICD-10-CM

## 2023-02-13 DIAGNOSIS — R319 Hematuria, unspecified: Secondary | ICD-10-CM

## 2023-02-13 MED ORDER — TRIAMCINOLONE ACETONIDE 0.1 % EX CREA: 1.0000 | TOPICAL_CREAM | Freq: Two times a day (BID) | CUTANEOUS | 0 refills | Status: DC

## 2023-02-13 MED ORDER — PREDNISONE 20 MG PO TABS: ORAL_TABLET | ORAL | 0 refills | Status: DC

## 2023-02-13 NOTE — ED Triage Notes (Addendum)
 Patient c/o rash on the lower left arm, right chest, and torso x 2 days.  Patient states she wants to see her PCP for her UTI symptoms because they are chronic.  Patient states the rash is around an IV site that she had this week when she was hospitalized for a seizure.

## 2023-02-13 NOTE — Discharge Instructions (Addendum)
 Please continue taking your nitrofurantoin .  This medication is indicated for the bacteria causing your UTI.  For now please stop vigorously washing your vaginal canal.  This is likely contributing to your recurrent UTIs. I have sent in a medication called prednisone  for you to take as directed to assist with your rash.  I have also sent in a medication called Kenalog  that you can apply to the rash to help with further itching.  Please make sure that you follow-up with your PCP for ongoing management
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
## 2023-02-25 ENCOUNTER — Emergency Department (HOSPITAL_COMMUNITY): Payer: Medicaid Other

## 2023-02-25 ENCOUNTER — Inpatient Hospital Stay (HOSPITAL_COMMUNITY)
Admission: EM | Admit: 2023-02-25 | Discharge: 2023-02-28 | DRG: 917 | Disposition: A | Discharge status: Other Acute Inpatient Hospital | Payer: Medicaid Other | Attending: Internal Medicine | Admitting: Internal Medicine

## 2023-02-25 DIAGNOSIS — E876 Hypokalemia: Secondary | ICD-10-CM | POA: Diagnosis present

## 2023-02-25 DIAGNOSIS — F1014 Alcohol abuse with alcohol-induced mood disorder: Secondary | ICD-10-CM | POA: Diagnosis present

## 2023-02-25 DIAGNOSIS — Z79899 Other long term (current) drug therapy: Secondary | ICD-10-CM

## 2023-02-25 DIAGNOSIS — Z811 Family history of alcohol abuse and dependence: Secondary | ICD-10-CM | POA: Diagnosis not present

## 2023-02-25 DIAGNOSIS — T426X2A Poisoning by other antiepileptic and sedative-hypnotic drugs, intentional self-harm, initial encounter: Secondary | ICD-10-CM | POA: Diagnosis present

## 2023-02-25 DIAGNOSIS — G929 Unspecified toxic encephalopathy: Secondary | ICD-10-CM | POA: Diagnosis present

## 2023-02-25 DIAGNOSIS — F17290 Nicotine dependence, other tobacco product, uncomplicated: Secondary | ICD-10-CM | POA: Diagnosis present

## 2023-02-25 DIAGNOSIS — G40909 Epilepsy, unspecified, not intractable, without status epilepticus: Secondary | ICD-10-CM | POA: Diagnosis present

## 2023-02-25 DIAGNOSIS — Z818 Family history of other mental and behavioral disorders: Secondary | ICD-10-CM | POA: Diagnosis not present

## 2023-02-25 DIAGNOSIS — I10 Essential (primary) hypertension: Secondary | ICD-10-CM | POA: Diagnosis present

## 2023-02-25 DIAGNOSIS — T50901A Poisoning by unspecified drugs, medicaments and biological substances, accidental (unintentional), initial encounter: Secondary | ICD-10-CM | POA: Diagnosis present

## 2023-02-25 DIAGNOSIS — F4323 Adjustment disorder with mixed anxiety and depressed mood: Secondary | ICD-10-CM | POA: Diagnosis present

## 2023-02-25 DIAGNOSIS — J9601 Acute respiratory failure with hypoxia: Secondary | ICD-10-CM | POA: Diagnosis present

## 2023-02-25 DIAGNOSIS — Z608 Other problems related to social environment: Secondary | ICD-10-CM | POA: Diagnosis present

## 2023-02-25 DIAGNOSIS — T50902A Poisoning by unspecified drugs, medicaments and biological substances, intentional self-harm, initial encounter: Secondary | ICD-10-CM | POA: Diagnosis not present

## 2023-02-25 DIAGNOSIS — Z823 Family history of stroke: Secondary | ICD-10-CM | POA: Diagnosis not present

## 2023-02-25 DIAGNOSIS — F332 Major depressive disorder, recurrent severe without psychotic features: Secondary | ICD-10-CM | POA: Diagnosis present

## 2023-02-25 DIAGNOSIS — T50904A Poisoning by unspecified drugs, medicaments and biological substances, undetermined, initial encounter: Principal | ICD-10-CM

## 2023-02-25 DIAGNOSIS — Z825 Family history of asthma and other chronic lower respiratory diseases: Secondary | ICD-10-CM | POA: Diagnosis not present

## 2023-02-25 DIAGNOSIS — T50902D Poisoning by unspecified drugs, medicaments and biological substances, intentional self-harm, subsequent encounter: Secondary | ICD-10-CM | POA: Diagnosis not present

## 2023-02-25 LAB — COMPREHENSIVE METABOLIC PANEL
ALT: 13 U/L (ref 0–44)
AST: 26 U/L (ref 15–41)
Albumin: 4.7 g/dL (ref 3.5–5.0)
Alkaline Phosphatase: 79 U/L (ref 38–126)
Anion gap: 12 (ref 5–15)
BUN: <5 mg/dL — ABNORMAL LOW (ref 6–20)
CO2: 21 mmol/L — ABNORMAL LOW (ref 22–32)
Calcium: 9.5 mg/dL (ref 8.9–10.3)
Chloride: 109 mmol/L (ref 98–111)
Creatinine, Ser: 0.45 mg/dL (ref 0.44–1.00)
GFR, Estimated: >60 mL/min (ref >60)
Glucose, Bld: 97 mg/dL (ref 70–99)
Potassium: 3.4 mmol/L — ABNORMAL LOW (ref 3.5–5.1)
Sodium: 142 mmol/L (ref 135–145)
Total Bilirubin: 0.7 mg/dL (ref 0.0–1.2)
Total Protein: 9.5 g/dL — ABNORMAL HIGH (ref 6.5–8.1)

## 2023-02-25 LAB — URINALYSIS, ROUTINE W REFLEX MICROSCOPIC
Bacteria, UA: NONE SEEN
Bilirubin Urine: NEGATIVE
Glucose, UA: NEGATIVE mg/dL
Ketones, ur: NEGATIVE mg/dL
Leukocytes,Ua: NEGATIVE
Nitrite: NEGATIVE
Protein, ur: NEGATIVE mg/dL
Specific Gravity, Urine: 1.002 — ABNORMAL LOW (ref 1.005–1.030)
pH: 6 (ref 5.0–8.0)

## 2023-02-25 LAB — BLOOD GAS, VENOUS
Acid-Base Excess: 1.7 mmol/L (ref 0.0–2.0)
Bicarbonate: 25.8 mmol/L (ref 20.0–28.0)
O2 Saturation: 88.1 %
Patient temperature: 37
pCO2, Ven: 38 mm[Hg] — ABNORMAL LOW (ref 44–60)
pH, Ven: 7.44 — ABNORMAL HIGH (ref 7.25–7.43)
pO2, Ven: 57 mm[Hg] — ABNORMAL HIGH (ref 32–45)

## 2023-02-25 LAB — CBC WITH DIFFERENTIAL/PLATELET
Abs Immature Granulocytes: 0.03 10*3/uL (ref 0.00–0.07)
Basophils Absolute: 0.1 10*3/uL (ref 0.0–0.1)
Basophils Relative: 1 %
Eosinophils Absolute: 0.2 10*3/uL (ref 0.0–0.5)
Eosinophils Relative: 2 %
HCT: 45.2 % (ref 36.0–46.0)
Hemoglobin: 14.6 g/dL (ref 12.0–15.0)
Immature Granulocytes: 0 %
Lymphocytes Relative: 25 %
Lymphs Abs: 2.2 10*3/uL (ref 0.7–4.0)
MCH: 29.1 pg (ref 26.0–34.0)
MCHC: 32.3 g/dL (ref 30.0–36.0)
MCV: 90 fL (ref 80.0–100.0)
Monocytes Absolute: 0.6 10*3/uL (ref 0.1–1.0)
Monocytes Relative: 6 %
Neutro Abs: 5.8 10*3/uL (ref 1.7–7.7)
Neutrophils Relative %: 66 %
Platelets: 354 10*3/uL (ref 150–400)
RBC: 5.02 MIL/uL (ref 3.87–5.11)
RDW: 13 % (ref 11.5–15.5)
WBC: 8.8 10*3/uL (ref 4.0–10.5)
nRBC: 0 % (ref 0.0–0.2)

## 2023-02-25 LAB — LIPASE, BLOOD: Lipase: 30 U/L (ref 11–51)

## 2023-02-25 LAB — RAPID URINE DRUG SCREEN, HOSP PERFORMED
Amphetamines: NOT DETECTED
Barbiturates: NOT DETECTED
Benzodiazepines: NOT DETECTED
Cocaine: NOT DETECTED
Opiates: NOT DETECTED
Tetrahydrocannabinol: NOT DETECTED

## 2023-02-25 LAB — HCG, QUANTITATIVE, PREGNANCY: hCG, Beta Chain, Quant, S: <1 m[IU]/mL (ref <5)

## 2023-02-25 MED ORDER — DEXMEDETOMIDINE HCL IN NACL 400 MCG/100ML IV SOLN
0.0000 ug/kg/h | INTRAVENOUS | Status: DC
Start: 2023-02-25 — End: 2023-02-26

## 2023-02-25 MED ORDER — MIDAZOLAM HCL 2 MG/2ML IJ SOLN
2.0000 mg | Freq: Once | INTRAMUSCULAR | Status: AC
  Administered 2023-02-25: 2 mg via INTRAVENOUS

## 2023-02-25 MED ORDER — POTASSIUM CHLORIDE 10 MEQ/100ML IV SOLN
10.0000 meq | Freq: Once | INTRAVENOUS | Status: AC
  Administered 2023-02-25: 10 meq via INTRAVENOUS
  Filled 2023-02-25: qty 100

## 2023-02-25 MED ORDER — MIDAZOLAM HCL 2 MG/2ML IJ SOLN
INTRAMUSCULAR | Status: AC
  Filled 2023-02-25: qty 2

## 2023-02-25 MED ORDER — NALOXONE HCL 2 MG/2ML IJ SOSY
PREFILLED_SYRINGE | INTRAMUSCULAR | Status: AC
Start: 2023-02-25 — End: 2023-02-25
  Filled 2023-02-25: qty 2

## 2023-02-25 MED ORDER — FENTANYL CITRATE PF 50 MCG/ML IJ SOSY
100.0000 ug | PREFILLED_SYRINGE | INTRAMUSCULAR | Status: DC | PRN
  Administered 2023-02-25: 100 ug via INTRAVENOUS
  Filled 2023-02-25: qty 2

## 2023-02-25 MED ORDER — ROCURONIUM BROMIDE 10 MG/ML (PF) SYRINGE
1.0000 mg/kg | PREFILLED_SYRINGE | Freq: Once | INTRAVENOUS | Status: DC
  Filled 2023-02-25: qty 10

## 2023-02-25 MED ORDER — ETOMIDATE 2 MG/ML IV SOLN
INTRAVENOUS | Status: DC | PRN
  Administered 2023-02-25: 20 mg via INTRAVENOUS

## 2023-02-25 MED ORDER — FENTANYL CITRATE PF 50 MCG/ML IJ SOSY
100.0000 ug | PREFILLED_SYRINGE | INTRAMUSCULAR | Status: DC | PRN
  Administered 2023-02-25: 200 ug via INTRAVENOUS
  Filled 2023-02-25: qty 4

## 2023-02-25 MED ORDER — SODIUM CHLORIDE 0.9 % IV SOLN
INTRAVENOUS | Status: AC | PRN
  Administered 2023-02-25: 1000 mL via INTRAVENOUS

## 2023-02-25 MED ORDER — MIDAZOLAM HCL 2 MG/2ML IJ SOLN
INTRAMUSCULAR | Status: DC | PRN
  Administered 2023-02-25: 2 mg via INTRAVENOUS

## 2023-02-25 MED ORDER — ETOMIDATE 2 MG/ML IV SOLN
20.0000 mg | Freq: Once | INTRAVENOUS | Status: DC
  Filled 2023-02-25: qty 10

## 2023-02-25 MED ORDER — PROPOFOL 1000 MG/100ML IV EMUL
5.0000 ug/kg/min | INTRAVENOUS | Status: DC
  Administered 2023-02-25: 5 ug/kg/min via INTRAVENOUS
  Administered 2023-02-26 (×2): 80 ug/kg/min via INTRAVENOUS
  Filled 2023-02-25 (×4): qty 100

## 2023-02-25 MED ORDER — ROCURONIUM BROMIDE 10 MG/ML (PF) SYRINGE
PREFILLED_SYRINGE | INTRAVENOUS | Status: DC | PRN
  Administered 2023-02-25: 50 mg via INTRAVENOUS

## 2023-02-25 NOTE — Progress Notes (Signed)
Patient transported to CT without any issues. 

## 2023-02-25 NOTE — ED Provider Notes (Signed)
[place] [facility]ERGENC[facility] DEPARTMENT AT [facility] Provider Note   CSN: [id number] Arrival date & time: 02/25/23  1909     History Chief Complaint  Patient presents with   Drug Overdose    HPI Tracy Foley is a 28 y.o. female presenting for intentional overdose. Empty bottle of keppra (500mg  60 tabs told EMS 16  tablets) 0-30 tabs of Xyzal  Patient GCS 3-5 on arrival  Patient's recorded medical, surgical, social, medication list and allergies were reviewed in the Snapshot window as part of the initial history.   Review of Systems   Review of Systems  Unable to perform ROS: Mental status change    Physical Exam Updated Vital Signs There were no vitals taken for this visit. Physical Exam Constitutional:      General: She is not in acute distress.    Appearance: She is not ill-appearing or toxic-appearing.  HENT:     Head: Normocephalic and atraumatic.  Eyes:     Extraocular Movements: Extraocular movements intact.     Pupils: Pupils are equal, round, and reactive to light.  Cardiovascular:     Rate and Rhythm: Normal rate.  Pulmonary:     Effort: No respiratory distress.  Abdominal:     General: Abdomen is flat.  Musculoskeletal:        General: No swelling, deformity or signs of injury.     Cervical back: Normal range of motion. No rigidity.  Skin:    General: Skin is warm and dry.  Neurological:     General: No focal deficit present.     Mental Status: She is alert and oriented to person, place, and time.  Psychiatric:        Mood and Affect: Mood normal.      ED Course/ Medical Decision Making/ A&P Clinical Course as of 02/25/23 2350  Fri Feb 25, 2023  2105 Personally consulted poison control. They recommended replacing potassium, monitoring EKGs for QRS QTc prolongation secondary to antihistamine toxicity. Benzodiazepines as needed for seizure-like episodes, observation overnight. [CC]  2139 New Admit:  Ladona Horns MRN  [id number] Multidrug overdose (anticholinergic) Intubated.  Thanks! Hiba Garry MD [phone] [CC]    Clinical Course User Index [CC] Glyn Ade, MD    Procedures .Critical Care  Performed by: Glyn Ade, MD Authorized by: Glyn Ade, MD   Critical care provider statement:    Critical care time (minutes):  85   Critical care was time spent personally by me on the following activities:  Development of treatment plan with patient or surrogate, discussions with consultants, evaluation of patient's response to treatment, examination of patient, ordering and review of laboratory studies, ordering and review of radiographic studies, ordering and performing treatments and interventions, pulse oximetry, re-evaluation of patient's condition and review of old charts Procedure Name: Intubation Date/Time: 02/25/2023 11:53 PM  Performed by: Glyn Ade, MDPre-anesthesia Checklist: Patient identified, Patient being monitored, Emergency Drugs available, Timeout performed and Suction available Oxygen Delivery Method: Non-rebreather mask Preoxygenation: Pre-oxygenation with 100% oxygen Induction Type: Rapid sequence Ventilation: Mask ventilation without difficulty Tube size: 7.5 mm Placement Confirmation: ETT inserted through vocal cords under direct vision, CO2 detector and Breath sounds checked- equal and bilateral      Medications Ordered in ED Medications - No data to display  Medical Decision Making:   This is a 28 year old female presenting with altered mental status after gross intoxication/substance overdose. Combination anticholinergic toxicity is favored as a likely etiology given the tachycardia, hypertension, dilated pupils, lacrimation.  GCS range between 5 and 10 during her first hour but she became progressively obtunded.  We trialed Narcan without any improvement.  Patient did begin to have a general tonic-clonic seizure.  The first seizure stopped  spontaneously.  About 10 minutes later she had a second seizure that required Versed to stop.  At this point, patient was very postictal and aggressive.  Required intubation for her protection.  Consulted poison control with recommendations in the ED course. Patient entraining Precedex, propofol, fentanyl pushes to treat her sedation. Anticipate close observation overnight.  Consulted critical care who is in agreement.  Disposition:   Based on the above findings, I believe this patient is stable for admission.    Patient/family educated about specific findings on our evaluation and explained exact reasons for admission.  Patient/family educated about clinical situation and time was allowed to answer questions.   Admission team communicated with and agreed with need for admission. Patient admitted. Patient ready to move at this time.     Emergency Department Medication Summary:   Medications  etomidate (AMIDATE) injection 20 mg ( Intravenous Canceled Entry 02/25/23 2152)  rocuronium (ZEMURON) injection 50 mg ( Intravenous Canceled Entry 02/25/23 2151)  propofol (DIPRIVAN) 1000 MG/100ML infusion (80 mcg/kg/min  57.6 kg Intravenous Rate/Dose Change 02/25/23 2117)  0.9 %  sodium chloride infusion (1,000 mLs Intravenous New Bag/Given 02/25/23 1948)  midazolam (VERSED) injection ( Intravenous Given 02/25/23 2152)  etomidate (AMIDATE) injection (20 mg Intravenous Given 02/25/23 1952)  rocuronium (ZEMURON) injection (50 mg Intravenous Given 02/25/23 1952)  fentaNYL (SUBLIMAZE) injection 100-200 mcg (200 mcg Intravenous Given 02/25/23 2145)  dexmedetomidine (PRECEDEX) 400 MCG/100ML (4 mcg/mL) infusion (has no administration in time range)  naloxone [facility]) 2 MG/2ML injection (  Given 02/25/23 1923)  midazolam (VERSED) injection 2 mg (2 mg Intravenous Given 02/25/23 1934)  potassium chloride 10 mEq in 100 mL IVPB (10 mEq Intravenous New Bag/Given 02/25/23 2158)         Clinical Impression: No diagnosis  found.   Data Unavailable   Final Clinical Impression(s) / ED Diagnoses Final diagnoses:  None    Rx / DC Orders ED Discharge Orders     None         Glyn Ade, MD 02/25/23 2353
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
## 2023-02-25 NOTE — ED Triage Notes (Signed)
Pt BIB [facility] from home. Pt with ems alert and responsive. Upon transport pt became unresponsive. Pt reports drinking wine and between 10-18 500mg  keppra. Pt reports intentionally trying to harm herself  138/90  145CBG 100% ra 7-24 rr 33ETCO2

## 2023-02-25 NOTE — Sedation Documentation (Signed)
TUBE PLACED 1955 21 AT THE LIP

## 2023-02-26 ENCOUNTER — Encounter (HOSPITAL_COMMUNITY): Payer: Self-pay | Admitting: Critical Care Medicine

## 2023-02-26 ENCOUNTER — Other Ambulatory Visit: Payer: Self-pay

## 2023-02-26 DIAGNOSIS — T50902A Poisoning by unspecified drugs, medicaments and biological substances, intentional self-harm, initial encounter: Secondary | ICD-10-CM | POA: Diagnosis not present

## 2023-02-26 DIAGNOSIS — T50902D Poisoning by unspecified drugs, medicaments and biological substances, intentional self-harm, subsequent encounter: Secondary | ICD-10-CM | POA: Diagnosis not present

## 2023-02-26 DIAGNOSIS — J9601 Acute respiratory failure with hypoxia: Secondary | ICD-10-CM | POA: Diagnosis not present

## 2023-02-26 LAB — CBC
HCT: 41 % (ref 36.0–46.0)
Hemoglobin: 12.9 g/dL (ref 12.0–15.0)
MCH: 29.6 pg (ref 26.0–34.0)
MCHC: 31.5 g/dL (ref 30.0–36.0)
MCV: 94 fL (ref 80.0–100.0)
Platelets: 228 10*3/uL (ref 150–400)
RBC: 4.36 MIL/uL (ref 3.87–5.11)
RDW: 13 % (ref 11.5–15.5)
WBC: 9 10*3/uL (ref 4.0–10.5)
nRBC: 0 % (ref 0.0–0.2)

## 2023-02-26 LAB — BASIC METABOLIC PANEL
Anion gap: 13 (ref 5–15)
BUN: <5 mg/dL — ABNORMAL LOW (ref 6–20)
CO2: 19 mmol/L — ABNORMAL LOW (ref 22–32)
Calcium: 8.1 mg/dL — ABNORMAL LOW (ref 8.9–10.3)
Chloride: 111 mmol/L (ref 98–111)
Creatinine, Ser: 0.46 mg/dL (ref 0.44–1.00)
GFR, Estimated: >60 mL/min (ref >60)
Glucose, Bld: 91 mg/dL (ref 70–99)
Potassium: 3.5 mmol/L (ref 3.5–5.1)
Sodium: 143 mmol/L (ref 135–145)

## 2023-02-26 LAB — BLOOD GAS, ARTERIAL
Acid-base deficit: 2 mmol/L (ref 0.0–2.0)
Bicarbonate: 23.1 mmol/L (ref 20.0–28.0)
Drawn by: 11249
FIO2: 100 %
MECHVT: 360 mL
O2 Saturation: 100 %
PEEP: 5 cmH2O
Patient temperature: 37.4
RATE: 14 {breaths}/min
pCO2 arterial: 41 mm[Hg] (ref 32–48)
pH, Arterial: 7.36 (ref 7.35–7.45)
pO2, Arterial: 468 mm[Hg] — ABNORMAL HIGH (ref 83–108)

## 2023-02-26 LAB — SALICYLATE LEVEL: Salicylate Lvl: <7 mg/dL — ABNORMAL LOW (ref 7.0–30.0)

## 2023-02-26 LAB — GLUCOSE, CAPILLARY
Glucose-Capillary: 80 mg/dL (ref 70–99)
Glucose-Capillary: 93 mg/dL (ref 70–99)
Glucose-Capillary: 80 mg/dL (ref 70–99)
Glucose-Capillary: 103 mg/dL — ABNORMAL HIGH (ref 70–99)
Glucose-Capillary: 91 mg/dL (ref 70–99)
Glucose-Capillary: 138 mg/dL — ABNORMAL HIGH (ref 70–99)

## 2023-02-26 LAB — HIV ANTIBODY (ROUTINE TESTING W REFLEX): HIV Screen 4th Generation wRfx: NONREACTIVE

## 2023-02-26 LAB — ACETAMINOPHEN LEVEL: Acetaminophen (Tylenol), Serum: <10 ug/mL — ABNORMAL LOW (ref 10–30)

## 2023-02-26 LAB — HEMOGLOBIN A1C
Hgb A1c MFr Bld: 5.2 % (ref 4.8–5.6)
Mean Plasma Glucose: 102.54 mg/dL

## 2023-02-26 LAB — PHOSPHORUS: Phosphorus: 2.5 mg/dL (ref 2.5–4.6)

## 2023-02-26 LAB — MRSA NEXT GEN BY PCR, NASAL: MRSA by PCR Next Gen: NOT DETECTED

## 2023-02-26 LAB — MAGNESIUM: Magnesium: 1.7 mg/dL (ref 1.7–2.4)

## 2023-02-26 MED ORDER — CHLORHEXIDINE GLUCONATE CLOTH 2 % EX PADS: 6.0000 | MEDICATED_PAD | Freq: Every day | CUTANEOUS | Status: DC

## 2023-02-26 MED ORDER — ACETAMINOPHEN 325 MG PO TABS
650.0000 mg | ORAL_TABLET | ORAL | Status: DC | PRN
  Administered 2023-02-26: 650 mg via ORAL
  Filled 2023-02-26: qty 2

## 2023-02-26 MED ORDER — PHENOL 1.4 % MT LIQD
1.0000 | OROMUCOSAL | Status: DC | PRN
  Administered 2023-02-26: 1 via OROMUCOSAL
  Filled 2023-02-26: qty 177

## 2023-02-26 MED ORDER — FENTANYL 2500MCG IN NS 250ML (10MCG/ML) PREMIX INFUSION
0.0000 ug/h | INTRAVENOUS | Status: DC
  Administered 2023-02-26: 25 ug/h via INTRAVENOUS
  Filled 2023-02-26: qty 250

## 2023-02-26 MED ORDER — MIDAZOLAM HCL 2 MG/2ML IJ SOLN: 0.5000 mg | INTRAMUSCULAR | Status: DC | PRN

## 2023-02-26 MED ORDER — ENOXAPARIN SODIUM 40 MG/0.4ML IJ SOSY
40.0000 mg | PREFILLED_SYRINGE | INTRAMUSCULAR | Status: DC
  Administered 2023-02-26 – 2023-02-27 (×2): 40 mg via SUBCUTANEOUS
  Filled 2023-02-26 (×3): qty 0.4

## 2023-02-26 MED ORDER — MAGNESIUM SULFATE 2 GM/50ML IV SOLN
2.0000 g | Freq: Once | INTRAVENOUS | Status: AC
  Administered 2023-02-26: 2 g via INTRAVENOUS
  Filled 2023-02-26: qty 50

## 2023-02-26 MED ORDER — DOCUSATE SODIUM 100 MG PO CAPS: 100.0000 mg | ORAL_CAPSULE | Freq: Two times a day (BID) | ORAL | Status: DC | PRN

## 2023-02-26 MED ORDER — INSULIN ASPART 100 UNIT/ML IJ SOLN
0.0000 [IU] | INTRAMUSCULAR | Status: DC
  Administered 2023-02-26: 1 [IU] via SUBCUTANEOUS
  Filled 2023-02-26: qty 0.09

## 2023-02-26 MED ORDER — POLYETHYLENE GLYCOL 3350 17 G PO PACK: 17.0000 g | PACK | Freq: Every day | ORAL | Status: DC | PRN

## 2023-02-26 MED ORDER — CHLORHEXIDINE GLUCONATE CLOTH 2 % EX PADS
6.0000 | MEDICATED_PAD | Freq: Every day | CUTANEOUS | Status: DC
  Administered 2023-02-26: 6 via TOPICAL

## 2023-02-26 MED ORDER — POTASSIUM CHLORIDE 20 MEQ PO PACK
40.0000 meq | PACK | Freq: Once | ORAL | Status: AC
  Administered 2023-02-26: 40 meq
  Filled 2023-02-26: qty 2

## 2023-02-26 MED ORDER — ORAL CARE MOUTH RINSE: 15.0000 mL | OROMUCOSAL | Status: DC | PRN
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
## 2023-02-26 NOTE — Progress Notes (Signed)
 Patient transferred to the ICU from the ED without any issues

## 2023-02-26 NOTE — Progress Notes (Signed)
 Patient passed her swallow screen Tracy Foley)

## 2023-02-26 NOTE — Plan of Care (Signed)
 New ventilated patient admitted to 1222, plan of care and goals set, patient handbook/guide at bedside, education provided on restraints.  Problem: Education: Goal: Ability to describe self-care measures that may prevent or decrease complications (Diabetes Survival Skills Education) will improve Outcome: Progressing Goal: Individualized Educational Video(s) Outcome: Progressing   Problem: Coping: Goal: Ability to adjust to condition or change in health will improve Outcome: Progressing   Problem: Fluid Volume: Goal: Ability to maintain a balanced intake and output will improve Outcome: Progressing   Problem: Health Behavior/Discharge Planning: Goal: Ability to identify and utilize available resources and services will improve Outcome: Progressing Goal: Ability to manage health-related needs will improve Outcome: Progressing   Problem: Metabolic: Goal: Ability to maintain appropriate glucose levels will improve Outcome: Progressing   Problem: Nutritional: Goal: Maintenance of adequate nutrition will improve Outcome: Progressing Goal: Progress toward achieving an optimal weight will improve Outcome: Progressing   Problem: Skin Integrity: Goal: Risk for impaired skin integrity will decrease Outcome: Progressing   Problem: Tissue Perfusion: Goal: Adequacy of tissue perfusion will improve Outcome: Progressing   Problem: Education: Goal: Knowledge of General Education information will improve Description: Including pain rating scale, medication(s)/side effects and non-pharmacologic comfort measures Outcome: Progressing   Problem: Health Behavior/Discharge Planning: Goal: Ability to manage health-related needs will improve Outcome: Progressing   Problem: Clinical Measurements: Goal: Ability to maintain clinical measurements within normal limits will improve Outcome: Progressing Goal: Will remain free from infection Outcome: Progressing Goal: Diagnostic test results will  improve Outcome: Progressing Goal: Respiratory complications will improve Outcome: Progressing Goal: Cardiovascular complication will be avoided Outcome: Progressing   Problem: Activity: Goal: Risk for activity intolerance will decrease Outcome: Progressing   Problem: Nutrition: Goal: Adequate nutrition will be maintained Outcome: Progressing   Problem: Coping: Goal: Level of anxiety will decrease Outcome: Progressing   Problem: Elimination: Goal: Will not experience complications related to bowel motility Outcome: Progressing Goal: Will not experience complications related to urinary retention Outcome: Progressing   Problem: Pain Managment: Goal: General experience of comfort will improve and/or be controlled Outcome: Progressing   Problem: Safety: Goal: Ability to remain free from injury will improve Outcome: Progressing   Problem: Skin Integrity: Goal: Risk for impaired skin integrity will decrease Outcome: Progressing   Problem: Safety: Goal: Non-violent Restraint(s) Outcome: Progressing   Problem: Activity: Goal: Ability to tolerate increased activity will improve Outcome: Progressing   Problem: Respiratory: Goal: Ability to maintain a clear airway and adequate ventilation will improve Outcome: Progressing   Problem: Role Relationship: Goal: Method of communication will improve Outcome: Progressing

## 2023-02-26 NOTE — Procedures (Signed)
 Extubation Procedure Note  Patient Details:   Name: Tracy Foley DOB: Oct 26, 1995 MRN: [id number]   Airway Documentation:    Vent end date: (S) 02/26/23 (Extubated per MD order.) Vent end time: (S) 0840   Evaluation  O2 sats: stable throughout Complications: No apparent complications Patient did tolerate procedure well. Bilateral Breath Sounds: Clear   Yes Extubated to 2 L [place], wean FI02 as tolerated.   Nunzio Cobbs 02/26/2023, 8:43 AM

## 2023-02-26 NOTE — Progress Notes (Signed)
 NAMEPascuala Foley, MRN:  [id number], DOB:  December 04, 1995, LOS: 1 ADMISSION DATE:  02/25/2023, CONSULTATION DATE:  02/25/23 REFERRING MD:  EDP CHIEF COMPLAINT:  overdose with acute hypoxic resp failure req mechanical ventilation   History of Present Illness:  28 yo presented to [facility] hospital after intentional overdose with reportedly Keppra and Xyzal. Initially was able to protect her airway but over course in the emergency department despite interventions for her encephalopathy progressed to agitation and ultimately obtundation req intubation for airway protection.   There is little history available 2/2 intubated sedated status, unreliable history at presentation and no family reachable.   Ccm asked to admit   Pertinent  Medical History  unknown  Significant Hospital Events: Including procedures, antibiotic start and stop dates in addition to other pertinent events   Admitted to ICU after overdose  Interim History / Subjective:  Low-grade febrile On propofol and low-dose fentanyl   Objective   Blood pressure 105/66, pulse 98, temperature 100 F (37.8 C), resp. rate 20, height 4' 11.02" (1.499 m), weight 57 kg, SpO2 100%, not currently breastfeeding.    Vent Mode: PRVC FiO2 (%):  [50 %-100 %] 50 % Set Rate:  [14 bmp] 14 bmp Vt Set:  [340 mL-360 mL] 340 mL PEEP:  [5 cmH20] 5 cmH20 Plateau Pressure:  [11 cmH20-15 cmH20] 12 cmH20   Intake/Output Summary (Last 24 hours) at 02/26/2023 4098 Last data filed at 02/26/2023 1191 Gross per 24 hour  Intake 638.39 ml  Output 830 ml  Net -191.61 ml   Filed Weights   02/25/23 2000 02/26/23 0200  Weight: 57.6 kg 57 kg    Examination: General: intubated sedated,  HENT: ncat, perrla but sluggish, mmmp Lungs: No accessory muscle use, bilateral ventilated breath sounds Cardiovascular: rrr Abdomen: soft, nt,nd bs + Extremities: no c/c/e, numerous tattoos Neuro: Awake, moves all 4 extremities, follows commands GU: deferred  Labs  show mild hypokalemia, no leukocytosis EKG shows normal QTc  Resolved Hospital Problem list     Assessment & Plan:  Intentional overdose -keppra + xyzal SI, with note left Acute hypoxic resp failure 2/2 above H/o mdd H/o sz d/o -Tolerating spontaneous breathing trials, awake, DC sedation and extubate -will need pysch and sitter once extubated -Obtain more history once extubated  -Hypokalemia will be repleted  Best Practice (right click and "Reselect all SmartList Selections" daily)   Diet/type: tubefeeds DVT prophylaxis SCD Pressure ulcer(s): N/A GI prophylaxis: N/A Lines: N/A Foley:  N/A Code Status:  full code Last date of multidisciplinary goals of care discussion [pending d/w family]  Labs   CBC: Recent Labs  Lab 02/25/23 1920 02/26/23 0253  WBC 8.8 9.0  NEUTROABS 5.8  --   HGB 14.6 12.9  HCT 45.2 41.0  MCV 90.0 94.0  PLT 354 228    Basic Metabolic Panel: Recent Labs  Lab 02/25/23 1920 02/26/23 0253  NA 142 143  K 3.4* 3.5  CL 109 111  CO2 21* 19*  GLUCOSE 97 91  BUN <5* <5*  CREATININE 0.45 0.46  CALCIUM 9.5 8.1*  MG  --  1.7  PHOS  --  2.5   GFR: Estimated Creatinine Clearance: 80.5 mL/min (by C-G formula based on SCr of 0.46 mg/dL). Recent Labs  Lab 02/25/23 1920 02/26/23 0253  WBC 8.8 9.0    Liver Function Tests: Recent Labs  Lab 02/25/23 1920  AST 26  ALT 13  ALKPHOS 79  BILITOT 0.7  PROT 9.5*  ALBUMIN 4.7   Recent  Labs  Lab 02/25/23 1920  LIPASE 30   No results for input(s): "AMMONIA" in the last 168 hours.  ABG    Component Value Date/Time   PHART 7.36 02/26/2023 0136   PCO2ART 41 02/26/2023 0136   PO2ART 468 (H) 02/26/2023 0136   HCO3 23.1 02/26/2023 0136   TCO2 26 10/02/2020 2107   ACIDBASEDEF 2.0 02/26/2023 0136   O2SAT 100 02/26/2023 0136     Coagulation Profile: No results for input(s): "INR", "PROTIME" in the last 168 hours.  Cardiac Enzymes: No results for input(s): "CKTOTAL", "CKMB", "CKMBINDEX",  "TROPONINI" in the last 168 hours.  HbA1C: Hgb A1c MFr Bld  Date/Time Value Ref Range Status  02/26/2023 02:53 AM 5.2 4.8 - 5.6 % Final    Comment:    (NOTE) Pre diabetes:          5.7%-6.4%  Diabetes:              >6.4%  Glycemic control for   <7.0% adults with diabetes   07/30/2021 11:43 AM 5.2 4.8 - 5.6 % Final    Comment:             Prediabetes: 5.7 - 6.4          Diabetes: >6.4          Glycemic control for adults with diabetes: <7.0     CBG: Recent Labs  Lab 02/26/23 0125 02/26/23 0328 02/26/23 0733  GLUCAP 80 93 80       Critical care time: 31mn     Cyril Mourning MD. FCCP. [facility] Pager : [phone]  If no response to pager , please call [phone] until 7 pm After 7:00 pm call Elink  [phone]   02/26/2023

## 2023-02-26 NOTE — Progress Notes (Signed)
   02/26/23 0742  Vent Select  Invasive or Noninvasive Invasive  Adult Vent Y  [REMOVED] Airway 7.5 mm  Removal Date/Time: 02/26/23 0840  Placement Date/Time: 02/25/23 1956   Placed By: ED Physician  Airway Device: Endotracheal Tube  Laryngoscope Blade: 3  ETT Types: Oral  Size (mm): 7.5 mm  Cuffed: Cuffed  Insertion attempts: 1  Airway Equipment: Light...  Secured at (cm) 25 cm  Measured From Lips  Secured Location Center  Secured By English as a second language teacher No  Tube Holder Repositioned Yes  Prone position No  Head position Right  Cuff Pressure (cm H2O) Green OR 18-26 CmH2O  Site Condition Drainage (Comment)  Adult Ventilator Settings  Vent Type Servo i  Humidity HME  Vent Mode (S)  CPAP;PSV  FiO2 (%) (S)  28 % (Weaned to 28%.)  Pressure Support (S)  5 cmH20  PEEP (S)  5 cmH20  Adult Ventilator Measurements  Peak Airway Pressure 11 L/min  Mean Airway Pressure 7 cmH20  Plateau Pressure 13 cmH20  Resp Rate Spontaneous 22 br/min  Resp Rate Total 22 br/min  Spont TV 520 mL  Measured Ve 8.6 L  Total PEEP 5 cmH20  SpO2 100 %  Adult Ventilator Alarms  Alarms On Y  Ve High Alarm 20 L/min  Ve Low Alarm 4 L/min  Resp Rate High Alarm 35 br/min  Resp Rate Low Alarm 8  PEEP Low Alarm 2 cmH2O  Press High Alarm 45 cmH2O  T Apnea 20 sec(s)  VAP Prevention  HOB> 30 Degrees Y  Daily Weaning Assessment  Weaning Start Time 41  Pre-WUA / WUA Start  Follows commands Yes  Breath Sounds  Bilateral Breath Sounds Clear;Diminished  Vent Respiratory Assessment  Level of Consciousness Alert  Respiratory Pattern Regular;Unlabored  Patient Tolerance Tolerated well  Suction Method  Respiratory Interventions Oral suction;Airway suction  Oral Suctioning/Secretions  Suction Type Oral  Suction Device Yankauer  Secretion Amount Small  Secretion Color White  Secretion Consistency Thin  Suction Tolerance Tolerated well  Suctioning Adverse Effects None  Airway  Suctioning/Secretions  Suction Type ETT  Suction Device  Catheter  Secretion Amount Small  Secretion Color White  Secretion Consistency Thin  Suction Tolerance Tolerated well  Suctioning Adverse Effects None

## 2023-02-26 NOTE — Progress Notes (Signed)
 eLink Physician-Brief Progress Note Patient Name: Tracy Foley DOB: 27-Nov-1995 MRN: [id number]   Date of Service  02/26/2023  HPI/Events of Note  Patient admitted to the ICU with altered mental status and acute respiratory failure requiring intubation and mechanical ventilation, secondary to intentional overdose of multiple medications.  eICU Interventions  New Patient Evaluation.        Rio Kidane U Assia Meanor 02/26/2023, 1:25 AM

## 2023-02-26 NOTE — Progress Notes (Signed)
 [facility] ADULT ICU REPLACEMENT PROTOCOL   The patient does apply for the [facility] Adult ICU Electrolyte Replacment Protocol based on the criteria listed below:   1.Exclusion criteria: TCTS, ECMO, Dialysis, and Myasthenia Gravis patients 2. Is GFR >/= 30 ml/min? Yes.    Patient's GFR today is >60 3. Is SCr </= 2? Yes.   Patient's SCr is 0.46 mg/dL 4. Did SCr increase >/= 0.5 in 24 hours? No. 5.Pt's weight >40kg  Yes.   6. Abnormal electrolyte(s): potassium 3.5, mag 1.7  7. Electrolytes replaced per protocol 8.  Call MD STAT for K+ </= 2.5, Phos </= 1, or Mag </= 1 Physician:  protocol  Melvern Banker 02/26/2023 4:37 AM

## 2023-02-27 DIAGNOSIS — J9601 Acute respiratory failure with hypoxia: Secondary | ICD-10-CM | POA: Diagnosis not present

## 2023-02-27 DIAGNOSIS — T50902D Poisoning by unspecified drugs, medicaments and biological substances, intentional self-harm, subsequent encounter: Secondary | ICD-10-CM | POA: Diagnosis not present

## 2023-02-27 DIAGNOSIS — T50902A Poisoning by unspecified drugs, medicaments and biological substances, intentional self-harm, initial encounter: Secondary | ICD-10-CM | POA: Diagnosis not present

## 2023-02-27 LAB — BASIC METABOLIC PANEL
Anion gap: 10 (ref 5–15)
BUN: 9 mg/dL (ref 6–20)
CO2: 18 mmol/L — ABNORMAL LOW (ref 22–32)
Calcium: 9.1 mg/dL (ref 8.9–10.3)
Chloride: 109 mmol/L (ref 98–111)
Creatinine, Ser: 0.53 mg/dL (ref 0.44–1.00)
GFR, Estimated: >60 mL/min (ref >60)
Glucose, Bld: 88 mg/dL (ref 70–99)
Potassium: 4.4 mmol/L (ref 3.5–5.1)
Sodium: 137 mmol/L (ref 135–145)

## 2023-02-27 LAB — GLUCOSE, CAPILLARY
Glucose-Capillary: 110 mg/dL — ABNORMAL HIGH (ref 70–99)
Glucose-Capillary: 110 mg/dL — ABNORMAL HIGH (ref 70–99)
Glucose-Capillary: 111 mg/dL — ABNORMAL HIGH (ref 70–99)
Glucose-Capillary: 99 mg/dL (ref 70–99)

## 2023-02-27 LAB — MAGNESIUM: Magnesium: 2 mg/dL (ref 1.7–2.4)

## 2023-02-27 LAB — PHOSPHORUS: Phosphorus: 3.1 mg/dL (ref 2.5–4.6)

## 2023-02-27 MED ORDER — SIMETHICONE 80 MG PO CHEW
80.0000 mg | CHEWABLE_TABLET | Freq: Four times a day (QID) | ORAL | Status: DC | PRN
  Administered 2023-02-27: 80 mg via ORAL
  Filled 2023-02-27: qty 1

## 2023-02-27 MED ORDER — FLUTICASONE PROPIONATE 50 MCG/ACT NA SUSP
2.0000 | Freq: Every day | NASAL | Status: DC
  Administered 2023-02-27: 2 via NASAL
  Filled 2023-02-27: qty 16

## 2023-02-27 MED ORDER — FLUOXETINE HCL 10 MG PO CAPS
10.0000 mg | ORAL_CAPSULE | Freq: Every day | ORAL | Status: DC
  Administered 2023-02-27 – 2023-02-28 (×2): 10 mg via ORAL
  Filled 2023-02-27 (×2): qty 1

## 2023-02-27 MED ORDER — LEVETIRACETAM 500 MG PO TABS
500.0000 mg | ORAL_TABLET | Freq: Two times a day (BID) | ORAL | Status: DC
  Administered 2023-02-27 – 2023-02-28 (×2): 500 mg via ORAL
  Filled 2023-02-27 (×2): qty 1

## 2023-02-27 MED ORDER — LEVOCETIRIZINE DIHYDROCHLORIDE 5 MG PO TABS: 5.0000 mg | ORAL_TABLET | Freq: Every evening | ORAL | Status: DC

## 2023-02-27 NOTE — Progress Notes (Signed)
 Patient status update given to poison control. They will be closing out her case. Told to reach out again if they are needed.

## 2023-02-27 NOTE — Progress Notes (Signed)
 PROGRESS NOTE    Tracy Foley  WUJ:[id number] DOB: 1995/11/05 DOA: 02/25/2023 PCP: Avanell Shackleton, NP-C   Brief Narrative:  28 yo presented to [facility] hospital after intentional overdose with reportedly Keppra and Xyzal. Initially was able to protect her airway but over course in the emergency department despite interventions for her encephalopathy progressed to agitation and ultimately obtundation req intubation for airway protection.   Hospital course: 2/21 Admitted to PCCM intubated after noted overdose 2/22 Extubated successfully - Psych consulted 2/23 Appears to be approaching medical stability for discharge - defer to psych for inpatient psych placement needs  Assessment & Plan:   Principal Problem:   Overdose Active Problems:   Acute hypoxic respiratory failure (HCC)    Intentional overdose with suicidal ideation - keppra + xyzal, POA Major depressive disorder SI, with note left Psych recommending Prozac 10 mg daily -Defer further medication changes to psych, pending inpatient transfer -Patient is currently medically stable for discharge Patient is under voluntary admission status at this time; psych recommending IVC if patient attempts to leave hospital.  Acute hypoxic resp failure 2/2 above, resolved Acute toxic encephalopathy 2/2 above, resolved Seizure disorder restart home anti-epileptics   Hypokalemia repleted    DVT prophylaxis: enoxaparin (LOVENOX) injection 40 mg Start: 02/26/23 1000 SCDs Start: 02/26/23 0035   Code Status:   Code Status: Full Code  Family Communication: None present  Status is: Inpatient  Dispo: The patient is from: Home              Anticipated d/c is to: Inpatient psych              Anticipated d/c date is: 24 to 48 hours              Patient currently is medically stable for discharge  Consultants:  Psych, PCCM  Procedures:  Intubation/extubation as above  Antimicrobials:  None indicated  Subjective: No acute  issues or events reported overnight  Objective: Vitals:   02/26/23 2209 02/27/23 0209 02/27/23 0557 02/27/23 0712  BP: 108/69 101/66 105/77 101/70  Pulse: 90 72 82 98  Resp: (!) 24 20 20 18   Temp: 99.2 F (37.3 C) 100.2 F (37.9 C) 98.4 F (36.9 C) 98.9 F (37.2 C)  TempSrc: Oral Oral Oral Oral  SpO2: 97% 100% 98% 98%  Weight:      Height:        Intake/Output Summary (Last 24 hours) at 02/27/2023 1222 Last data filed at 02/27/2023 0559 Gross per 24 hour  Intake 360 ml  Output 0 ml  Net 360 ml   Filed Weights   02/25/23 2000 02/26/23 0200  Weight: 57.6 kg 57 kg    Examination:  General:  Pleasantly resting in bed, No acute distress. HEENT:  Normocephalic atraumatic.  Sclerae nonicteric, noninjected.  Extraocular movements intact bilaterally. Neck:  Without mass or deformity.  Trachea is midline. Lungs:  Clear to auscultate bilaterally without rhonchi, wheeze, or rales. Heart:  Regular rate and rhythm.  Without murmurs, rubs, or gallops. Abdomen:  Soft, nontender, nondistended. Extremities: Without cyanosis, clubbing, edema, or obvious deformity.   Data Reviewed: I have personally reviewed following labs and imaging studies  CBC: Recent Labs  Lab 02/25/23 1920 02/26/23 0253  WBC 8.8 9.0  NEUTROABS 5.8  --   HGB 14.6 12.9  HCT 45.2 41.0  MCV 90.0 94.0  PLT 354 228   Basic Metabolic Panel: Recent Labs  Lab 02/25/23 1920 02/26/23 0253 02/27/23 0456  NA 142 143 137  K 3.4* 3.5 4.4  CL 109 111 109  CO2 21* 19* 18*  GLUCOSE 97 91 88  BUN <5* <5* 9  CREATININE 0.45 0.46 0.53  CALCIUM 9.5 8.1* 9.1  MG  --  1.7 2.0  PHOS  --  2.5 3.1   GFR: Estimated Creatinine Clearance: 80.5 mL/min (by C-G formula based on SCr of 0.53 mg/dL). Liver Function Tests: Recent Labs  Lab 02/25/23 1920  AST 26  ALT 13  ALKPHOS 79  BILITOT 0.7  PROT 9.5*  ALBUMIN 4.7   Recent Labs  Lab 02/25/23 1920  LIPASE 30   HbA1C: Recent Labs    02/26/23 0253  HGBA1C  5.2   CBG: Recent Labs  Lab 02/26/23 1953 02/27/23 0005 02/27/23 0204 02/27/23 0436 02/27/23 0746  GLUCAP 138* 110* 111* 110* 99    Recent Results (from the past 240 hours)  MRSA Next Gen by PCR, Nasal     Status: None   Collection Time: 02/26/23 12:57 AM   Specimen: Nasal Mucosa; Nasal Swab  Result Value Ref Range Status   MRSA by PCR Next Gen NOT DETECTED NOT DETECTED Final    Comment: (NOTE) The GeneXpert MRSA Assay (FDA approved for NASAL specimens only), is one component of a comprehensive MRSA colonization surveillance program. It is not intended to diagnose MRSA infection nor to guide or monitor treatment for MRSA infections. Test performance is not FDA approved in patients less than 37 years old. Performed at [facility], [address], [place], [place] [address]     Radiology Studies: DG Chest Portable 1 View Result Date: 02/25/2023 CLINICAL DATA:  Check intubation, ETT and NGT EXAM: PORTABLE CHEST 1 VIEW COMPARISON:  Portable chest 10/02/2020 FINDINGS: The heart size and mediastinal contours are within normal limits. Both lungs are clear. ETT is in place with tip 1.5 cm from the carina. NGT tip is in the distal stomach, well placed. There is moderate thoracolumbar dextroscoliosis, seen previously. Osseous structures are intact. Multiple overlying monitor wires. IMPRESSION: 1. No evidence of acute chest disease. 2. ETT and NGT as described above. 3. Moderate thoracolumbar dextroscoliosis. Electronically Signed   By: Almira Bar M.D.   On: 02/25/2023 21:22   CT HEAD WO CONTRAST ( ) Result Date: 02/25/2023 CLINICAL DATA:  Altered level of consciousness, overdose, unresponsive EXAM: CT HEAD WITHOUT CONTRAST TECHNIQUE: Contiguous axial images were obtained from the base of the skull through the vertex without intravenous contrast. RADIATION DOSE REDUCTION: This exam was performed according to the departmental dose-optimization program which includes  automated exposure control, adjustment of the mA and/or kV according to patient size and/or use of iterative reconstruction technique. COMPARISON:  01/02/2021 FINDINGS: Brain: No acute infarct or hemorrhage. Lateral ventricles and midline structures are unremarkable. No acute extra-axial fluid collections. No mass effect. Vascular: No hyperdense vessel or unexpected calcification. Skull: Normal. Negative for fracture or focal lesion. Sinuses/Orbits: Because of thickening within the right maxillary, ethmoid, and sphenoid sinuses. Other: None. IMPRESSION: 1. Stable head CT, no acute intracranial process. Electronically Signed   By: Sharlet Salina M.D.   On: 02/25/2023 20:25   Scheduled Meds:  Chlorhexidine Gluconate Cloth  6 each Topical Q0600   enoxaparin (LOVENOX) injection  40 mg Subcutaneous Q24H   FLUoxetine  10 mg Oral Daily   fluticasone  2 spray Each Nare Daily   levETIRAcetam  500 mg Oral BID   [START ON 02/28/2023] levocetirizine  5 mg Oral QPM   Continuous Infusions:   LOS: 2  days   Time spent:  Azucena Fallen, DO [facility]  If 7PM-7AM, please contact night-coverage [email or web address]  02/27/2023, 12:22 PM
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
## 2023-02-27 NOTE — Progress Notes (Signed)
 PROGRESS NOTE    Tracy Foley  ZOX:[id number] DOB: 08/10/1995 DOA: 02/25/2023 PCP: Avanell Shackleton, NP-C   Brief Narrative:  28 yo presented to [facility] hospital after intentional overdose with reportedly Keppra and Xyzal. Initially was able to protect her airway but over course in the emergency department despite interventions for her encephalopathy progressed to agitation and ultimately obtundation req intubation for airway protection.   Hospital course: 2/21 Admitted to PCCM intubated after noted overdose 2/22 Extubated successfully - Psych consulted 2/23 Appears to be approaching medical stability for discharge - defer to psych for inpatient psych placement needs  Assessment & Plan:   Principal Problem:   Overdose Active Problems:   Acute hypoxic respiratory failure (HCC)    Intentional overdose with suicidal ideation - keppra + xyzal, POA Major depressive disorder SI, with note left Psych recommending Prozac 10 mg daily -Defer further medication changes to psych, pending inpatient transfer -Patient is currently medically stable for discharge Patient is under voluntary admission status at this time; psych recommending IVC if patient attempts to leave hospital.  Acute hypoxic resp failure 2/2 above, resolved Acute toxic encephalopathy 2/2 above, resolved Seizure disorder restart home anti-epileptics   Hypokalemia repleted    DVT prophylaxis: enoxaparin (LOVENOX) injection 40 mg Start: 02/26/23 1000 SCDs Start: 02/26/23 0035   Code Status:   Code Status: Full Code  Family Communication: None present  Status is: Inpatient  Dispo: The patient is from: Home              Anticipated d/c is to: Inpatient psych              Anticipated d/c date is: 24 to 48 hours              Patient currently is medically stable for discharge  Consultants:  Psych, PCCM  Procedures:  Intubation/extubation as above  Antimicrobials:  None indicated  Subjective: No acute  issues or events reported overnight  Objective: Vitals:   02/26/23 2209 02/27/23 0209 02/27/23 0557 02/27/23 0712  BP: 108/69 101/66 105/77 101/70  Pulse: 90 72 82 98  Resp: (!) 24 20 20 18   Temp: 99.2 F (37.3 C) 100.2 F (37.9 C) 98.4 F (36.9 C) 98.9 F (37.2 C)  TempSrc: Oral Oral Oral Oral  SpO2: 97% 100% 98% 98%  Weight:      Height:        Intake/Output Summary (Last 24 hours) at 02/27/2023 1245 Last data filed at 02/27/2023 0559 Gross per 24 hour  Intake 360 ml  Output 0 ml  Net 360 ml   Filed Weights   02/25/23 2000 02/26/23 0200  Weight: 57.6 kg 57 kg    Examination:  General:  Pleasantly resting in bed, No acute distress. HEENT:  Normocephalic atraumatic.  Sclerae nonicteric, noninjected.  Extraocular movements intact bilaterally. Neck:  Without mass or deformity.  Trachea is midline. Lungs:  Clear to auscultate bilaterally without rhonchi, wheeze, or rales. Heart:  Regular rate and rhythm.  Without murmurs, rubs, or gallops. Abdomen:  Soft, nontender, nondistended. Extremities: Without cyanosis, clubbing, edema, or obvious deformity.  Data Reviewed: I have personally reviewed following labs and imaging studies  CBC: Recent Labs  Lab 02/25/23 1920 02/26/23 0253  WBC 8.8 9.0  NEUTROABS 5.8  --   HGB 14.6 12.9  HCT 45.2 41.0  MCV 90.0 94.0  PLT 354 228   Basic Metabolic Panel: Recent Labs  Lab 02/25/23 1920 02/26/23 0253 02/27/23 0456  NA 142 143 137  K 3.4* 3.5 4.4  CL 109 111 109  CO2 21* 19* 18*  GLUCOSE 97 91 88  BUN <5* <5* 9  CREATININE 0.45 0.46 0.53  CALCIUM 9.5 8.1* 9.1  MG  --  1.7 2.0  PHOS  --  2.5 3.1   GFR: Estimated Creatinine Clearance: 80.5 mL/min (by C-G formula based on SCr of 0.53 mg/dL). Liver Function Tests: Recent Labs  Lab 02/25/23 1920  AST 26  ALT 13  ALKPHOS 79  BILITOT 0.7  PROT 9.5*  ALBUMIN 4.7   Recent Labs  Lab 02/25/23 1920  LIPASE 30   HbA1C: Recent Labs    02/26/23 0253  HGBA1C 5.2    CBG: Recent Labs  Lab 02/26/23 1953 02/27/23 0005 02/27/23 0204 02/27/23 0436 02/27/23 0746  GLUCAP 138* 110* 111* 110* 99    Recent Results (from the past 240 hours)  MRSA Next Gen by PCR, Nasal     Status: None   Collection Time: 02/26/23 12:57 AM   Specimen: Nasal Mucosa; Nasal Swab  Result Value Ref Range Status   MRSA by PCR Next Gen NOT DETECTED NOT DETECTED Final    Comment: (NOTE) The GeneXpert MRSA Assay (FDA approved for NASAL specimens only), is one component of a comprehensive MRSA colonization surveillance program. It is not intended to diagnose MRSA infection nor to guide or monitor treatment for MRSA infections. Test performance is not FDA approved in patients less than 20 years old. Performed at [facility], [address] [place], [place] [address]     Radiology Studies: DG Chest Portable 1 View Result Date: 02/25/2023 CLINICAL DATA:  Check intubation, ETT and NGT EXAM: PORTABLE CHEST 1 VIEW COMPARISON:  Portable chest 10/02/2020 FINDINGS: The heart size and mediastinal contours are within normal limits. Both lungs are clear. ETT is in place with tip 1.5 cm from the carina. NGT tip is in the distal stomach, well placed. There is moderate thoracolumbar dextroscoliosis, seen previously. Osseous structures are intact. Multiple overlying monitor wires. IMPRESSION: 1. No evidence of acute chest disease. 2. ETT and NGT as described above. 3. Moderate thoracolumbar dextroscoliosis. Electronically Signed   By: Almira Bar M.D.   On: 02/25/2023 21:22   CT HEAD WO CONTRAST ( ) Result Date: 02/25/2023 CLINICAL DATA:  Altered level of consciousness, overdose, unresponsive EXAM: CT HEAD WITHOUT CONTRAST TECHNIQUE: Contiguous axial images were obtained from the base of the skull through the vertex without intravenous contrast. RADIATION DOSE REDUCTION: This exam was performed according to the departmental dose-optimization program which includes  automated exposure control, adjustment of the mA and/or kV according to patient size and/or use of iterative reconstruction technique. COMPARISON:  01/02/2021 FINDINGS: Brain: No acute infarct or hemorrhage. Lateral ventricles and midline structures are unremarkable. No acute extra-axial fluid collections. No mass effect. Vascular: No hyperdense vessel or unexpected calcification. Skull: Normal. Negative for fracture or focal lesion. Sinuses/Orbits: Because of thickening within the right maxillary, ethmoid, and sphenoid sinuses. Other: None. IMPRESSION: 1. Stable head CT, no acute intracranial process. Electronically Signed   By: Sharlet Salina M.D.   On: 02/25/2023 20:25   Scheduled Meds:  Chlorhexidine Gluconate Cloth  6 each Topical Q0600   enoxaparin (LOVENOX) injection  40 mg Subcutaneous Q24H   FLUoxetine  10 mg Oral Daily   fluticasone  2 spray Each Nare Daily   levETIRAcetam  500 mg Oral BID   [START ON 02/28/2023] levocetirizine  5 mg Oral QPM   Continuous Infusions:   LOS: 2  days   Time spent:  Azucena Fallen, DO [facility]  If 7PM-7AM, please contact night-coverage [email or web address]  02/27/2023, 12:45 PM

## 2023-02-27 NOTE — TOC Initial Note (Signed)
 Transition of Care [facility]) - Initial/Assessment Note    Patient Details  Name: Tracy Foley MRN: [id number] Date of Birth: September 23, 1995  Transition [facility]) CM/SW Contact:    Adrian Prows, RN Phone Number: 02/27/2023, 4:20 PM  Clinical Narrative:                 Spoke to pt in room; pt lives at home w/ her son; she identified POC mother Darrol Poke [phone]); pt verified insurance/PCP'; she denies SDOH risks; pt says she does not have DME, HH services, or home oxygen; awaiting psych eval; ? Discharge to IP Psych facility; transportation tbd; TOC is following.  Expected Discharge Plan: Psychiatric Hospital Barriers to Discharge: Continued Medical Work up   Patient Goals and CMS Choice            Expected Discharge Plan and Services   Discharge Planning Services: CM Consult   Living arrangements for the past 2 months: Apartment                                      Prior Living Arrangements/Services Living arrangements for the past 2 months: Apartment Lives with:: Minor Children Patient language and need for interpreter reviewed:: Yes Do you feel safe going back to the place where you live?: Yes      Need for Family Participation in Patient Care: Yes (Comment) Care giver support system in place?: Yes (comment) Current home services:  (n/a) Criminal Activity/Legal Involvement Pertinent to Current Situation/Hospitalization: No - Comment as needed  Activities of Daily Living   ADL Screening (condition at time of admission) Independently performs ADLs?: No Does the patient have a NEW difficulty with bathing/dressing/toileting/self-feeding that is expected to last >3 days?: No Does the patient have a NEW difficulty with getting in/out of bed, walking, or climbing stairs that is expected to last >3 days?: No Does the patient have a NEW difficulty with communication that is expected to last >3 days?: No Is the patient deaf or have difficulty  hearing?: No Does the patient have difficulty seeing, even when wearing glasses/contacts?: No Does the patient have difficulty concentrating, remembering, or making decisions?: No  Permission Sought/Granted Permission sought to share information with : Case Manager Permission granted to share information with : Yes, Verbal Permission Granted  Share Information with NAME: Case Manager     Permission granted to share info w Relationship: Darrol Poke (mother) [phone]     Emotional Assessment Appearance:: Appears stated age Attitude/Demeanor/Rapport: Gracious Affect (typically observed): Accepting Orientation: : Oriented to Self, Oriented to Place, Oriented to  Time, Oriented to Situation Alcohol / Substance Use: Not Applicable Psych Involvement: No (comment)  Admission diagnosis:  Overdose [T50.901A] Drug overdose of undetermined intent, initial encounter [T50.904A] Acute hypoxic respiratory failure (HCC) [J96.01] Patient Active Problem List   Diagnosis Date Noted   Acute hypoxic respiratory failure (HCC) 02/26/2023   Overdose 02/25/2023   Other idiopathic scoliosis, thoracolumbar region 01/14/2023   Chronic thoracic back pain 01/14/2023   History of cesarean delivery 09/14/2021   Delivery of pregnancy by cesarean section 09/14/2021   Supervision of high risk pregnancy, antepartum 08/03/2021   Poor fetal growth affecting management of mother in third trimester 07/30/2021   History of cesarean delivery affecting pregnancy 07/30/2021   Polyhydramnios affecting pregnancy 07/30/2021   Seizure disorder (HCC) 07/20/2021   VBAC (vaginal birth after Cesarean) 07/03/2021   No prenatal care in  current pregnancy 07/03/2021   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   History of pre-eclampsia 07/03/2021   Depression 07/03/2021   Abnormal urine odor 07/03/2021   Dysuria 07/03/2021   Prolonged seizure (HCC) 07/24/2020   PCP:  Avanell Shackleton, NP-C Pharmacy:   [facility] [phone] - [place], [place] - [address] AT [facility] OF [address]  [place] [address] Phone: [phone] Fax: [phone]  [id number] - [place], [place] - [address] AT [facility] OF [address] [place] [address] Phone: [phone] Fax: [phone]     Social Drivers of Health (SDOH) Social History: SDOH Screenings   Food Insecurity: No Food Insecurity (02/27/2023)  Housing: Low Risk  (02/27/2023)  Transportation Needs: No Transportation Needs (02/27/2023)  Utilities: Not At Risk (02/27/2023)  Depression (PHQ2-9): High Risk (01/26/2022)  Social Connections: Patient Unable To Answer (02/26/2023)  Tobacco Use: Medium Risk (02/26/2023)   SDOH Interventions: Food Insecurity Interventions: Intervention Not Indicated, Inpatient TOC Housing Interventions: Intervention Not Indicated, Inpatient TOC Transportation Interventions: Intervention Not Indicated, Inpatient TOC Utilities Interventions: Intervention Not Indicated, Inpatient TOC   Readmission Risk Interventions     No data to display

## 2023-02-28 ENCOUNTER — Other Ambulatory Visit: Payer: Self-pay

## 2023-02-28 ENCOUNTER — Inpatient Hospital Stay (HOSPITAL_COMMUNITY)
Admission: AD | Admit: 2023-02-28 | Discharge: 2023-03-05 | DRG: 885 | Disposition: A | Discharge status: Home / Self Care | Payer: Medicaid Other | Source: Intra-hospital | Attending: Psychiatry | Admitting: Psychiatry

## 2023-02-28 ENCOUNTER — Encounter (HOSPITAL_COMMUNITY): Payer: Self-pay | Admitting: Psychiatry

## 2023-02-28 DIAGNOSIS — F332 Major depressive disorder, recurrent severe without psychotic features: Secondary | ICD-10-CM | POA: Diagnosis present

## 2023-02-28 DIAGNOSIS — X58XXXD Exposure to other specified factors, subsequent encounter: Secondary | ICD-10-CM | POA: Diagnosis present

## 2023-02-28 DIAGNOSIS — Z79899 Other long term (current) drug therapy: Secondary | ICD-10-CM | POA: Diagnosis not present

## 2023-02-28 DIAGNOSIS — R110 Nausea: Secondary | ICD-10-CM | POA: Diagnosis not present

## 2023-02-28 DIAGNOSIS — Z825 Family history of asthma and other chronic lower respiratory diseases: Secondary | ICD-10-CM

## 2023-02-28 DIAGNOSIS — F1010 Alcohol abuse, uncomplicated: Secondary | ICD-10-CM | POA: Diagnosis present

## 2023-02-28 DIAGNOSIS — Z818 Family history of other mental and behavioral disorders: Secondary | ICD-10-CM | POA: Diagnosis not present

## 2023-02-28 DIAGNOSIS — Z823 Family history of stroke: Secondary | ICD-10-CM | POA: Diagnosis not present

## 2023-02-28 DIAGNOSIS — Z87820 Personal history of traumatic brain injury: Secondary | ICD-10-CM

## 2023-02-28 DIAGNOSIS — F17210 Nicotine dependence, cigarettes, uncomplicated: Secondary | ICD-10-CM | POA: Diagnosis present

## 2023-02-28 DIAGNOSIS — T391X2D Poisoning by 4-Aminophenol derivatives, intentional self-harm, subsequent encounter: Secondary | ICD-10-CM | POA: Diagnosis not present

## 2023-02-28 DIAGNOSIS — T426X2D Poisoning by other antiepileptic and sedative-hypnotic drugs, intentional self-harm, subsequent encounter: Secondary | ICD-10-CM | POA: Diagnosis not present

## 2023-02-28 DIAGNOSIS — Y92239 Unspecified place in hospital as the place of occurrence of the external cause: Secondary | ICD-10-CM | POA: Diagnosis not present

## 2023-02-28 DIAGNOSIS — T50902A Poisoning by unspecified drugs, medicaments and biological substances, intentional self-harm, initial encounter: Secondary | ICD-10-CM | POA: Diagnosis not present

## 2023-02-28 DIAGNOSIS — Z811 Family history of alcohol abuse and dependence: Secondary | ICD-10-CM

## 2023-02-28 DIAGNOSIS — T43225A Adverse effect of selective serotonin reuptake inhibitors, initial encounter: Secondary | ICD-10-CM | POA: Diagnosis not present

## 2023-02-28 DIAGNOSIS — G40909 Epilepsy, unspecified, not intractable, without status epilepticus: Secondary | ICD-10-CM | POA: Diagnosis present

## 2023-02-28 MED ORDER — SIMETHICONE 80 MG PO CHEW: 80.0000 mg | CHEWABLE_TABLET | Freq: Four times a day (QID) | ORAL | Status: DC | PRN

## 2023-02-28 MED ORDER — FLUOXETINE HCL 10 MG PO CAPS
10.0000 mg | ORAL_CAPSULE | Freq: Every day | ORAL | Status: DC
  Administered 2023-03-01 – 2023-03-03 (×3): 10 mg via ORAL
  Filled 2023-02-28 (×5): qty 1

## 2023-02-28 MED ORDER — OLANZAPINE 10 MG IM SOLR: 10.0000 mg | Freq: Two times a day (BID) | INTRAMUSCULAR | Status: DC | PRN

## 2023-02-28 MED ORDER — DOCUSATE SODIUM 100 MG PO CAPS: 100.0000 mg | ORAL_CAPSULE | Freq: Two times a day (BID) | ORAL | Status: DC | PRN

## 2023-02-28 MED ORDER — TRAZODONE HCL 50 MG PO TABS
50.0000 mg | ORAL_TABLET | Freq: Every evening | ORAL | Status: DC | PRN
  Filled 2023-02-28: qty 1

## 2023-02-28 MED ORDER — LEVETIRACETAM 500 MG PO TABS
500.0000 mg | ORAL_TABLET | Freq: Two times a day (BID) | ORAL | Status: DC
  Administered 2023-02-28 – 2023-03-05 (×10): 500 mg via ORAL
  Filled 2023-02-28 (×16): qty 1

## 2023-02-28 MED ORDER — OLANZAPINE 10 MG PO TABS
10.0000 mg | ORAL_TABLET | Freq: Two times a day (BID) | ORAL | Status: DC | PRN
  Filled 2023-02-28: qty 1

## 2023-02-28 MED ORDER — FLUOXETINE HCL 10 MG PO CAPS: 10.0000 mg | ORAL_CAPSULE | Freq: Every day | ORAL | 0 refills | Status: DC

## 2023-02-28 MED ORDER — HYDROXYZINE HCL 25 MG PO TABS
25.0000 mg | ORAL_TABLET | Freq: Three times a day (TID) | ORAL | Status: DC | PRN
  Administered 2023-02-28: 25 mg via ORAL
  Filled 2023-02-28: qty 1

## 2023-02-28 MED ORDER — ACETAMINOPHEN 325 MG PO TABS
650.0000 mg | ORAL_TABLET | ORAL | Status: DC | PRN
Start: 2023-02-28 — End: 2023-03-05

## 2023-02-28 MED ORDER — PHENOL 1.4 % MT LIQD: 1.0000 | OROMUCOSAL | Status: DC | PRN

## 2023-02-28 MED ORDER — POLYETHYLENE GLYCOL 3350 17 G PO PACK
17.0000 g | PACK | Freq: Every day | ORAL | Status: DC | PRN
  Administered 2023-03-03: 17 g via ORAL
  Filled 2023-02-28: qty 1

## 2023-02-28 MED ORDER — FLUTICASONE PROPIONATE 50 MCG/ACT NA SUSP
2.0000 | Freq: Every day | NASAL | Status: DC
  Administered 2023-03-02: 2 via NASAL
  Filled 2023-02-28: qty 16

## 2023-02-28 NOTE — Plan of Care (Signed)
   Problem: Coping: Goal: Ability to adjust to condition or change in health will improve Outcome: Progressing

## 2023-02-28 NOTE — Progress Notes (Signed)
 Patient refused Lovenox injection. MD made aware.

## 2023-02-28 NOTE — BHH Group Notes (Signed)
Patient attended the AA group. 
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
## 2023-02-28 NOTE — BHH Group Notes (Signed)
 BHH Group Notes:  (Nursing/MHT/Case Management/Adjunct)  Date:  02/28/2023  Time:  10:48 PM  Type of Therapy:  Psychoeducational Skills  Participation Level:  Did Not Attend  Participation Quality:  Resistant  Affect:  Resistant  Cognitive:  Lacking  Insight:  None  Engagement in Group:  None  Modes of Intervention:  Education  Summary of Progress/Problems:The patient did not attend group this evening.   Tracy Foley 02/28/2023, 10:48 PM

## 2023-02-28 NOTE — Progress Notes (Signed)
 Report called to [facility] [phone].

## 2023-02-28 NOTE — TOC Transition Note (Signed)
 Transition of Care [facility]) - Discharge Note   Patient Details  Name: Tracy Foley MRN: [id number] Date of Birth: 12/28/1995  [facility] CM/SW Contact:  Howell Rucks, RN Phone Number: 02/28/2023, 3:58 PM   Clinical Narrative:   Pt declined voluntary admission to [facility]. Order for IVC. IVC completed, pt to transfer to [facility] via transport by [employer], RM 302-1. No further TOC needs.     Final next level of care: Psychiatric Hospital Barriers to Discharge: Barriers Resolved   Patient Goals and CMS Choice Patient states their goals for this hospitalization and ongoing recovery are:: return home          Discharge Placement                       Discharge Plan and Services Additional resources added to the After Visit Summary for     Discharge Planning Services: CM Consult                                 Social Drivers of Health (SDOH) Interventions SDOH Screenings   Food Insecurity: No Food Insecurity (02/27/2023)  Housing: Low Risk  (02/27/2023)  Transportation Needs: No Transportation Needs (02/27/2023)  Utilities: Not At Risk (02/27/2023)  Depression (PHQ2-9): High Risk (01/26/2022)  Social Connections: Patient Unable To Answer (02/26/2023)  Tobacco Use: Medium Risk (02/26/2023)     Readmission Risk Interventions    02/28/2023    3:58 PM  Readmission Risk Prevention Plan  Post Dischage Appt Complete  Medication Screening Complete  Transportation Screening Complete

## 2023-02-28 NOTE — Progress Notes (Signed)
 Pt admitted to unit, oriented to unit, admission forms reviewed with patient. Patient denies any needs at this time, she states that she "wants to go home because she doesn't belong here." Patient is also preoccupied with leaving as she has to work to pay child support and is afraid she will lose her job. Patient in her room, sitting quietly.    02/28/23 1800  Psych Admission Type (Psych Patients Only)  Admission Status Involuntary  Psychosocial Assessment  Patient Complaints Anxiety (Pt c/o anxiety due to being here and not home so that she can work.)  Patent attorney Brief  Facial Expression Animated  Affect Anxious;Apprehensive  Speech Logical/coherent  Interaction Assertive  Motor Activity Fidgety  Appearance/Hygiene In scrubs  Behavior Characteristics Cooperative;Appropriate to situation  Mood Anxious  Thought Process  Coherency Circumstantial  Content WDL  Delusions None reported or observed  Perception WDL  Hallucination None reported or observed  Judgment Limited  Confusion None  Danger to Self  Current suicidal ideation? Denies  Agreement Not to Harm Self Yes  Description of Agreement Verbal  Danger to Others  Danger to Others None reported or observed

## 2023-02-28 NOTE — TOC Progression Note (Addendum)
 Transition of Care [facility]) - Progression Note    Patient Details  Name: Tracy Foley MRN: [id number] Date of Birth: 19-Aug-1995  Transition of [facility] CM/SW Contact  Howell Rucks, RN Phone Number: 02/28/2023, 9:19 AM  Clinical Narrative: Psych eval completed 2/23, recommendation for IP Psych once medically cleared. TOC will continue to follow.     10:11am Met with pt at bedside to introduce role of TOC/NCM and obtain consent for voluntary admission and treatment at [facility], pt reports she does not want to go. Team notified. BH NP will initiate IVC.   _11:40am IVC submitted, Envelope [id number] , await determination.   -3:05pm IVC completed, scanned in EPIC. Team notified.     Expected Discharge Plan: Psychiatric Hospital Barriers to Discharge: Continued Medical Work up  Expected Discharge Plan and Services   Discharge Planning Services: CM Consult   Living arrangements for the past 2 months: Apartment                                       Social Determinants of Health (SDOH) Interventions SDOH Screenings   Food Insecurity: No Food Insecurity (02/27/2023)  Housing: Low Risk  (02/27/2023)  Transportation Needs: No Transportation Needs (02/27/2023)  Utilities: Not At Risk (02/27/2023)  Depression (PHQ2-9): High Risk (01/26/2022)  Social Connections: Patient Unable To Answer (02/26/2023)  Tobacco Use: Medium Risk (02/26/2023)    Readmission Risk Interventions     No data to display

## 2023-02-28 NOTE — Discharge Summary (Signed)
 Physician Discharge Summary  Tracy Foley HQI:[id number] DOB: 1995/06/30 DOA: 02/25/2023  PCP: Avanell Shackleton, NP-C  Admit date: 02/25/2023 Discharge date: 02/28/2023  Admitted From: Home Disposition:  Inpt Psych  Discharge Condition:Stable  CODE STATUS:Full  Diet recommendation: As tolerated    Brief/Interim Summary: 28 yo presented to [facility] hospital after intentional overdose with reportedly Keppra and Xyzal. Initially was able to protect her airway but over course in the emergency department despite interventions for her encephalopathy progressed to agitation and ultimately obtundation req intubation for airway protection.    Hospital course: 2/21 Admitted to PCCM intubated after noted overdose 2/22 Extubated successfully - Psych consulted 2/23 Appears to be approaching medical stability for discharge - defer to psych for inpatient psych placement needs 2/24 DC to inpatient psych pending bed availability  Discharge Diagnoses:  Principal Problem:   Overdose Active Problems:   Acute hypoxic respiratory failure (HCC)   Major depressive disorder, recurrent severe without psychotic features (HCC)  Intentional overdose with suicidal ideation - keppra + xyzal, POA Major depressive disorder SI, with note left Psych initially recommending Prozac 10 mg daily Plan for DC to inpatient psych facility   Alcohol use disorder - Stable Acute hypoxic resp failure 2/2 above, resolved Acute toxic encephalopathy 2/2 above, resolved Seizure disorder continue home anti-epileptics Hypokalemia repleted  Discharge Instructions  Discharge Instructions     Discharge patient   Complete by: As directed    Discharge disposition: 65-Discharged/transferred to Psychiatric Hospital or Psychiatric Unit/Distinct Part of Hospital   Discharge patient date: 02/28/2023      Allergies as of 02/28/2023   No Known Allergies      Medication List     TAKE these medications    FLUoxetine 10 MG  capsule Commonly known as: PROZAC Take 1 capsule (10 mg total) by mouth daily. Start taking on: March 01, 2023   fluticasone 50 MCG/ACT nasal spray Commonly known as: FLONASE Place 2 sprays into both nostrils daily. What changed:  when to take this reasons to take this   levETIRAcetam 500 MG tablet Commonly known as: Keppra Take 1 tablet (500 mg total) by mouth 2 (two) times daily.   levocetirizine 5 MG tablet Commonly known as: XYZAL Take 1 tablet (5 mg total) by mouth every evening. What changed:  when to take this reasons to take this   medroxyPROGESTERone 150 MG/ML injection Commonly known as: DEPO-PROVERA Inject 150 mg into the muscle every 3 (three) months.   triamcinolone cream 0.1 % Commonly known as: KENALOG Apply 1 Application topically 2 (two) times daily. What changed:  when to take this reasons to take this Another medication with the same name was removed. Continue taking this medication, and follow the directions you see here.        Follow-up Information     Schedule an appointment as soon as possible for a visit  with Connect with your PCP/Specialist as discussed.   Contact information: [email or web address] Call our physician referral line at [phone].               No Known Allergies  Consultations: Psych   Procedures/Studies: DG Chest Portable 1 View Result Date: 02/25/2023 CLINICAL DATA:  Check intubation, ETT and NGT EXAM: PORTABLE CHEST 1 VIEW COMPARISON:  Portable chest 10/02/2020 FINDINGS: The heart size and mediastinal contours are within normal limits. Both lungs are clear. ETT is in place with tip 1.5 cm from the carina. NGT tip is in the distal stomach, well placed. There is moderate thoracolumbar  dextroscoliosis, seen previously. Osseous structures are intact. Multiple overlying monitor wires. IMPRESSION: 1. No evidence of acute chest disease. 2. ETT and NGT as described above. 3. Moderate  thoracolumbar dextroscoliosis. Electronically Signed   By: Almira Bar M.D.   On: 02/25/2023 21:22   CT HEAD WO CONTRAST ( ) Result Date: 02/25/2023 CLINICAL DATA:  Altered level of consciousness, overdose, unresponsive EXAM: CT HEAD WITHOUT CONTRAST TECHNIQUE: Contiguous axial images were obtained from the base of the skull through the vertex without intravenous contrast. RADIATION DOSE REDUCTION: This exam was performed according to the departmental dose-optimization program which includes automated exposure control, adjustment of the mA and/or kV according to patient size and/or use of iterative reconstruction technique. COMPARISON:  01/02/2021 FINDINGS: Brain: No acute infarct or hemorrhage. Lateral ventricles and midline structures are unremarkable. No acute extra-axial fluid collections. No mass effect. Vascular: No hyperdense vessel or unexpected calcification. Skull: Normal. Negative for fracture or focal lesion. Sinuses/Orbits: Because of thickening within the right maxillary, ethmoid, and sphenoid sinuses. Other: None. IMPRESSION: 1. Stable head CT, no acute intracranial process. Electronically Signed   By: Sharlet Salina M.D.   On: 02/25/2023 20:25     Subjective: No acute issues/events overnight   Discharge Exam: Vitals:   02/27/23 2112 02/28/23 0604  BP: 98/71 106/67  Pulse: 73 75  Resp: 18 18  Temp: 98.5 F (36.9 C) (!) 97.5 F (36.4 C)  SpO2: 99% 98%   Vitals:   02/27/23 1825 02/27/23 2112 02/28/23 0500 02/28/23 0604  BP: 104/71 98/71  106/67  Pulse: 65 73  75  Resp: 17 18  18   Temp: 98.7 F (37.1 C) 98.5 F (36.9 C)  (!) 97.5 F (36.4 C)  TempSrc: Oral Oral  Oral  SpO2: 97% 99%  98%  Weight:   60.1 kg   Height:        General: Pt is alert, awake, not in acute distress Cardiovascular: RRR, S1/S2 +, no rubs, no gallops Respiratory: CTA bilaterally, no wheezing, no rhonchi Abdominal: Soft, NT, ND, bowel sounds + Extremities: no edema, no cyanosis    The  results of significant diagnostics from this hospitalization (including imaging, microbiology, ancillary and laboratory) are listed below for reference.     Microbiology: Recent Results (from the past 240 hours)  MRSA Next Gen by PCR, Nasal     Status: None   Collection Time: 02/26/23 12:57 AM   Specimen: Nasal Mucosa; Nasal Swab  Result Value Ref Range Status   MRSA by PCR Next Gen NOT DETECTED NOT DETECTED Final    Comment: (NOTE) The GeneXpert MRSA Assay (FDA approved for NASAL specimens only), is one component of a comprehensive MRSA colonization surveillance program. It is not intended to diagnose MRSA infection nor to guide or monitor treatment for MRSA infections. Test performance is not FDA approved in patients less than 47 years old. Performed at [facility], [address], [place], [place] [address]      Labs: BNP (last 3 results) No results for input(s): "BNP" in the last 8760 hours. Basic Metabolic Panel: Recent Labs  Lab 02/25/23 1920 02/26/23 0253 02/27/23 0456  NA 142 143 137  K 3.4* 3.5 4.4  CL 109 111 109  CO2 21* 19* 18*  GLUCOSE 97 91 88  BUN <5* <5* 9  CREATININE 0.45 0.46 0.53  CALCIUM 9.5 8.1* 9.1  MG  --  1.7 2.0  PHOS  --  2.5 3.1   Liver Function Tests: Recent Labs  Lab  02/25/23 1920  AST 26  ALT 13  ALKPHOS 79  BILITOT 0.7  PROT 9.5*  ALBUMIN 4.7   Recent Labs  Lab 02/25/23 1920  LIPASE 30   No results for input(s): "AMMONIA" in the last 168 hours. CBC: Recent Labs  Lab 02/25/23 1920 02/26/23 0253  WBC 8.8 9.0  NEUTROABS 5.8  --   HGB 14.6 12.9  HCT 45.2 41.0  MCV 90.0 94.0  PLT 354 228   Cardiac Enzymes: No results for input(s): "CKTOTAL", "CKMB", "CKMBINDEX", "TROPONINI" in the last 168 hours. BNP: Invalid input(s): "POCBNP" CBG: Recent Labs  Lab 02/26/23 1953 02/27/23 0005 02/27/23 0204 02/27/23 0436 02/27/23 0746  GLUCAP 138* 110* 111* 110* 99   D-Dimer No results for input(s):  "DDIMER" in the last 72 hours. Hgb A1c Recent Labs    02/26/23 0253  HGBA1C 5.2   Lipid Profile No results for input(s): "CHOL", "HDL", "LDLCALC", "TRIG", "CHOLHDL", "LDLDIRECT" in the last 72 hours. Thyroid function studies No results for input(s): "TSH", "T4TOTAL", "T3FREE", "THYROIDAB" in the last 72 hours.  Invalid input(s): "FREET3" Anemia work up No results for input(s): "VITAMINB12", "FOLATE", "FERRITIN", "TIBC", "IRON", "RETICCTPCT" in the last 72 hours. Urinalysis    Component Value Date/Time   COLORURINE COLORLESS (A) 02/25/2023 1920   APPEARANCEUR CLEAR 02/25/2023 1920   LABSPEC 1.002 (L) 02/25/2023 1920   PHURINE 6.0 02/25/2023 1920   GLUCOSEU NEGATIVE 02/25/2023 1920   HGBUR MODERATE (A) 02/25/2023 1920   BILIRUBINUR NEGATIVE 02/25/2023 1920   BILIRUBINUR negative 07/02/2021 0837   KETONESUR NEGATIVE 02/25/2023 1920   PROTEINUR NEGATIVE 02/25/2023 1920   UROBILINOGEN 0.2 12/17/2021 1439   NITRITE NEGATIVE 02/25/2023 1920   LEUKOCYTESUR NEGATIVE 02/25/2023 1920   Sepsis Labs Recent Labs  Lab 02/25/23 1920 02/26/23 0253  WBC 8.8 9.0   Microbiology Recent Results (from the past 240 hours)  MRSA Next Gen by PCR, Nasal     Status: None   Collection Time: 02/26/23 12:57 AM   Specimen: Nasal Mucosa; Nasal Swab  Result Value Ref Range Status   MRSA by PCR Next Gen NOT DETECTED NOT DETECTED Final    Comment: (NOTE) The GeneXpert MRSA Assay (FDA approved for NASAL specimens only), is one component of a comprehensive MRSA colonization surveillance program. It is not intended to diagnose MRSA infection nor to guide or monitor treatment for MRSA infections. Test performance is not FDA approved in patients less than 26 years old. Performed at [facility], [address] [place], [place] [address]      Time coordinating discharge: Over 30 minutes  SIGNED:   Azucena Fallen, DO [facility] 02/28/2023, 10:00 AM Pager   If  7PM-7AM, please contact night-coverage [email or web address]

## 2023-02-28 NOTE — Plan of Care (Signed)
  Problem: Education: Goal: Ability to describe self-care measures that may prevent or decrease complications (Diabetes Survival Skills Education) will improve Outcome: Progressing   Problem: Metabolic: Goal: Ability to maintain appropriate glucose levels will improve Outcome: Progressing   

## 2023-03-01 ENCOUNTER — Encounter (HOSPITAL_COMMUNITY): Payer: Self-pay | Admitting: Psychiatry

## 2023-03-01 DIAGNOSIS — F332 Major depressive disorder, recurrent severe without psychotic features: Secondary | ICD-10-CM | POA: Diagnosis not present

## 2023-03-01 NOTE — Group Note (Signed)
 Recreation Therapy Group Note   Group Topic:Animal Assisted Therapy   Group Date: 03/01/2023 Start Time: 0945 End Time: 1030 Facilitators: Cataleah Stites-McCall, LRT,CTRS Location: [address] Dayroom   Animal-Assisted Activity (AAA) Program Checklist/Progress Notes Patient Eligibility Criteria Checklist & Daily Group note for Rec Tx Intervention  AAA/T Program Assumption of Risk Form signed by Patient/ or Parent Legal Guardian Yes  Patient is free of allergies or severe asthma Yes  Patient reports no fear of animals Yes  Patient reports no history of cruelty to animals Yes  Patient understands his/her participation is voluntary Yes  Education: Charity fundraiser, Appropriate Animal Interaction   Education Outcome: Acknowledges education.    Affect/Mood: Appropriate   Participation Level: Engaged   Participation Quality: Independent   Behavior: Appropriate   Speech/Thought Process: Focused   Insight: Good   Judgement: Good   Modes of Intervention: Teaching laboratory technician   Patient Response to Interventions:  Engaged   Education Outcome:  In group clarification offered    Clinical Observations/Individualized Feedback: Patient attended session and interacted appropriately with therapy dog and peers. Patient asked appropriate questions about therapy dog and his training. Patient shared stories about their pets at home with group.     Plan: Continue to engage patient in RT group sessions 2-3x/week.   Lareina Espino-McCall, LRT,CTRS 03/01/2023 1:48 PM
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
## 2023-03-01 NOTE — [facility] Group Notes (Signed)
 [facility] Group Notes:  (Nursing/MHT/Case Management/Adjunct)  Date:  03/01/2023  Time:  11:59 PM  Type of Therapy:   Wrap-up group  Participation Level:  Active  Participation Quality:  Appropriate  Affect:  Appropriate  Cognitive:  Appropriate  Insight:  Appropriate  Engagement in Group:  Engaged  Modes of Intervention:  Education  Summary of Progress/Problems: Pt goal to get numbers out of her phone. Not met. Rated day 9/10.   Tracy Foley 03/01/2023, 11:59 PM

## 2023-03-01 NOTE — Plan of Care (Signed)
 Pt denies SI/HI/AVH. Pt is pleasant and cooperative. Pt was offered support and encouragement. Pt was given scheduled medications. Pt was encourage to attend groups. Q 15 minute safety checks were done for safety. Pt has no complaints at this time. Pt receptive to treatment and safety maintained on unit.  Problem: Education: Goal: Knowledge of [place] [facility] Education information/materials will improve Outcome: Progressing Goal: Emotional status will improve Outcome: Progressing Goal: Mental status will improve Outcome: Progressing Goal: Verbalization of understanding the information provided will improve Outcome: Progressing   Problem: Activity: Goal: Interest or engagement in activities will improve Outcome: Progressing Goal: Sleeping patterns will improve Outcome: Progressing   Problem: Coping: Goal: Ability to verbalize frustrations and anger appropriately will improve Outcome: Progressing Goal: Ability to demonstrate self-control will improve Outcome: Progressing   Problem: Health Behavior/Discharge Planning: Goal: Identification of resources available to assist in meeting health care needs will improve Outcome: Progressing Goal: Compliance with treatment plan for underlying cause of condition will improve Outcome: Progressing   Problem: Physical Regulation: Goal: Ability to maintain clinical measurements within normal limits will improve Outcome: Progressing   Problem: Safety: Goal: Periods of time without injury will increase Outcome: Progressing

## 2023-03-01 NOTE — [facility] Suicide Risk Assessment (Signed)
 [facility] Admission Suicide Risk Assessment   Nursing information obtained from:  Patient Demographic factors:  Living alone Current Mental Status:  Suicidal ideation indicated by others Loss Factors:  NA Historical Factors:  NA Risk Reduction Factors:  Employed  Total Time spent with patient: 30 minutes Principal Problem: Major depressive disorder, recurrent severe without psychotic features (HCC) Diagnosis:  Principal Problem:   Major depressive disorder, recurrent severe without psychotic features (HCC)  Subjective Data:  28 year old female with history of alcohol use disorder and seizure disorder.  Admitted involuntarily following an overdose while under the influence of alcohol.  Continued Clinical Symptoms:  Alcohol Use Disorder Identification Test Final Score (AUDIT): 0 The "Alcohol Use Disorders Identification Test", Guidelines for Use in Primary Care, Second Edition.  World Science writer [facility]). Score between 0-7:  no or low risk or alcohol related problems. Score between 8-15:  moderate risk of alcohol related problems. Score between 16-19:  high risk of alcohol related problems. Score 20 or above:  warrants further diagnostic evaluation for alcohol dependence and treatment.   CLINICAL FACTORS:   Alcohol/Substance Abuse/Dependencies Epilepsy   Musculoskeletal: Strength & Muscle Tone: within normal limits Gait & Station: normal Patient leans: N/A  Psychiatric Specialty Exam:  Presentation  General Appearance:   General Appearance:  In hospital clothing, in group setting before interview, good relatedness, appropriate behavior, no EPS.   Eye Contact: Good   Speech: Spontaneous.  Normal rate, tone and volume.   Mood and Affect  Mood: Euthymic   Affect: Full range and mood congruent.   Thought Process  Thought Processes: Linear and goal directed.   Descriptions of Associations:Intact   Orientation:Full (Time, Place and Person)   Thought  Content: No negative rumination.  No guilty rumination.  No current suicidal thoughts.  No homicidal thoughts.  No thoughts of violence.  No obsession.  No delusional preoccupation.   Hallucinations: No hallucination in any modality.   Sensorium  Memory: Immediate Good; Recent Good; Remote Good   Judgment: Good   Insight: Good     Executive Functions  Concentration: Good   Attention Span: Good   Recall: Good   Fund of Knowledge: Good   Language: Good     Psychomotor Activity  Normal psychomotor activity.  Physical Exam: Physical Exam ROS Blood pressure 103/76, pulse 86, temperature 98.7 F (37.1 C), temperature source Oral, resp. rate 15, height 4\' 11"  (1.499 m), weight 58.5 kg, SpO2 98%, not currently breastfeeding. Body mass index is 26.05 kg/m.   COGNITIVE FEATURES THAT CONTRIBUTE TO RISK:  None    SUICIDE RISK:   Minimal: No identifiable suicidal ideation.  Patients presenting with no risk factors but with morbid ruminations; may be classified as minimal risk based on the severity of the depressive symptoms  PLAN OF CARE:   We will maintain her current psychotropic medications.  Patient will be on every 15 minute checks for suicide.  I certify that inpatient services furnished can reasonably be expected to improve the patient's condition.   Georgiann Cocker, MD 03/01/2023, 2:09 PM

## 2023-03-01 NOTE — Progress Notes (Signed)
   03/01/23 0600  15 Minute Checks  Location Bedroom  Visual Appearance Calm  Behavior Sleeping  Sleep (Behavioral Health Patients Only)  Calculate sleep? (Click Yes once per 24 hr at 0600 safety check) Yes  Documented sleep last 24 hours 7.75

## 2023-03-01 NOTE — Group Note (Signed)
 LLCSW Group Therapy Note   Group Date: 03/01/2023 Start Time: 1100 End Time: 1200  Participation:  did not attend  Type of Therapy:  Group Therapy  Topic:  Healing From Within: Understanding Our Past, Building Our Future   Objective:  To help participants understand the impact of early experiences on mental and physical health, with a focus on Adverse Childhood Experiences (ACEs), and to explore ways to build resilience and healing.  Goals: Understand ACEs and Their Impact: Learn how childhood experiences shape mental and physical health. Build Resilience: Develop strategies for overcoming challenges and creating positive change. Promote Healing: Recognize the value of support and the possibility of healing through therapy and self-care.  Therapeutic Modalities Used: Psychoeducation: Sharing information about ACEs and their effects. Cognitive Behavioral Therapy (CBT): Helping reframe negative thought patterns. Trauma-Informed Therapy: Creating a safe, supportive space for healing.   Alfredia Ferguson Lizzie Cokley, LCSWA 03/01/2023  1:30 PM

## 2023-03-01 NOTE — [facility] Group Notes (Signed)
 Adult Psychoeducational Group Note  Date:  03/01/2023 Time:  4:38 PM  Group Topic/Focus:  Emotional Education:   The focus of this group is to discuss what feelings/emotions are, and how they are experienced. Goals Group:   The focus of this group is to help patients establish daily goals to achieve during treatment and discuss how the patient can incorporate goal setting into their daily lives to aide in recovery. Orientation:   The focus of this group is to educate the patient on the purpose and policies of crisis stabilization and provide a format to answer questions about their admission.  The group details unit policies and expectations of patients while admitted.  Participation Level:  Did Not Attend  Additional Comments:  Did not attend  Lucilla Edin 03/01/2023, 4:38 PM

## 2023-03-01 NOTE — Progress Notes (Signed)
 Pt is alert and oriented, she denies any SI/HI/AVH, She states she is worried about her work and finances coupled with the child support she is paying to the kids father. She refused any PRN's offered, she engaged in tonight's therapy session. Denies any form of anxiety or depression. She seems calm and pleasant when approached. Patient was encouraged to voice out any concerns or let this knows when there is a change in mood or behavior. No aggressive behavior noted, Q 15 safety checks per unit protocol.

## 2023-03-01 NOTE — Progress Notes (Signed)
   03/01/23 0800  Psych Admission Type (Psych Patients Only)  Admission Status Involuntary  Psychosocial Assessment  Patient Complaints Anxiety  Eye Contact Brief  Facial Expression Animated  Affect Anxious;Sad;Sullen  Speech Logical/coherent  Interaction Assertive  Motor Activity Fidgety  Appearance/Hygiene Improved  Behavior Characteristics Cooperative;Appropriate to situation  Mood Anxious  Aggressive Behavior  Effect No apparent injury  Thought Process  Coherency Circumstantial  Content Paranoia  Delusions None reported or observed  Perception WDL  Hallucination None reported or observed  Judgment Limited  Confusion None  Danger to Self  Current suicidal ideation? Denies  Agreement Not to Harm Self Yes  Description of Agreement Verbal  Danger to Others  Danger to Others None reported or observed

## 2023-03-02 ENCOUNTER — Encounter (HOSPITAL_COMMUNITY): Payer: Self-pay

## 2023-03-02 DIAGNOSIS — F332 Major depressive disorder, recurrent severe without psychotic features: Secondary | ICD-10-CM | POA: Diagnosis not present

## 2023-03-02 NOTE — Group Note (Signed)
 Recreation Therapy Group Note   Group Topic:Stress Management  Group Date: 03/02/2023 Start Time: 5366 End Time: 0952 Facilitators: Nova Schmuhl-McCall, LRT,CTRS Location: [address] Dayroom   Group Topic: Stress Management  Goal Area(s) Addresses:  Patient will identify positive stress management techniques. Patient will identify benefits of using stress management post d/c.  Behavioral Response:   Intervention: Insight Timer App  Activity: Meditation. LRT and patients went over meditation before listening to meditation presented in group. LRT played a meditation that focused on self-compassion and speaking positive over ones self throughout the course of the day. The meditation also focused on accepting yourself as you presently are while working towards growth. Patients were to listen and follow along as meditation played to get the most out of it.    Education:  Stress Management, Discharge Planning.   Education Outcome: Acknowledges Education   Affect/Mood: Appropriate   Participation Level: Moderate   Participation Quality: Independent   Behavior: Cooperative   Speech/Thought Process: Relevant   Insight: Fair   Judgement: Fair    Modes of Intervention: App   Patient Response to Interventions:  Receptive   Education Outcome:  In group clarification offered    Clinical Observations/Individualized Feedback: Pt didn't participate in meditation. Pt was quiet and respectful as peers participated in meditation.     Plan: Continue to engage patient in RT group sessions 2-3x/week.   Dallie Patton-McCall, LRT,CTRS 03/02/2023 12:49 PM

## 2023-03-02 NOTE — Progress Notes (Signed)
   03/02/23 0830  Psych Admission Type (Psych Patients Only)  Admission Status Involuntary  Psychosocial Assessment  Patient Complaints Worrying  Eye Contact Brief  Facial Expression Animated  Affect Sullen  Speech Logical/coherent  Interaction Assertive  Motor Activity Slow  Appearance/Hygiene Improved  Behavior Characteristics Appropriate to situation  Mood Pleasant  Aggressive Behavior  Effect No apparent injury  Thought Process  Coherency Circumstantial  Content Paranoia  Delusions None reported or observed  Perception WDL  Hallucination None reported or observed  Judgment Limited  Confusion None  Danger to Self  Current suicidal ideation? Denies  Agreement Not to Harm Self Yes  Description of Agreement Verbal  Danger to Others  Danger to Others None reported or observed

## 2023-03-02 NOTE — Plan of Care (Signed)
   Problem: Education: Goal: Emotional status will improve Outcome: Progressing Goal: Mental status will improve Outcome: Progressing

## 2023-03-02 NOTE — [facility] Group Notes (Signed)
 Spirituality group facilitated by Kathleen Argue, BCC.  Group Description: Group focused on topic of hope. Patients participated in facilitated discussion around topic, connecting with one another around experiences and definitions for hope. Group members engaged with visual explorer photos, reflecting on what hope looks like for them today. Group engaged in discussion around how their definitions of hope are present today in hospital.  Modalities: Psycho-social ed, Adlerian, Narrative, MI  Patient Progress: Tracy Foley attended group and actively engaged and participated in group conversation and activities.  Comments demonstrated good insight and contributed positively to the group conversation.
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
## 2023-03-02 NOTE — Plan of Care (Signed)
  Problem: Coping: Goal: Ability to verbalize frustrations and anger appropriately will improve Outcome: Progressing   Problem: Safety: Goal: Periods of time without injury will increase Outcome: Progressing   Problem: Health Behavior/Discharge Planning: Goal: Identification of resources available to assist in meeting health care needs will improve Outcome: Progressing

## 2023-03-02 NOTE — BH IP Treatment Plan (Signed)
 Interdisciplinary Treatment and Diagnostic Plan Update  03/02/2023 Time of Session: 1130AM Tracy Foley MRN: [id number]  Principal Diagnosis: Major depressive disorder, recurrent severe without psychotic features (HCC)  Secondary Diagnoses: Principal Problem:   Major depressive disorder, recurrent severe without psychotic features (HCC)   Current Medications:  Current Facility-Administered Medications  Medication Dose Route Frequency Provider Last Rate Last Admin   acetaminophen (TYLENOL) tablet 650 mg  650 mg Oral Q4H PRN Charm Rings, NP       docusate sodium (COLACE) capsule 100 mg  100 mg Oral BID PRN Charm Rings, NP       FLUoxetine (PROZAC) capsule 10 mg  10 mg Oral Daily Charm Rings, NP   10 mg at 03/02/23 0830   fluticasone (FLONASE) 50 MCG/ACT nasal spray 2 spray  2 spray Each Nare Daily Charm Rings, NP   2 spray at 03/02/23 0830   hydrOXYzine (ATARAX) tablet 25 mg  25 mg Oral TID PRN Charm Rings, NP   25 mg at 02/28/23 2117   levETIRAcetam (KEPPRA) tablet 500 mg  500 mg Oral BID Charm Rings, NP   500 mg at 03/02/23 0830   phenol (CHLORASEPTIC) mouth spray 1 spray  1 spray Mouth/Throat Q2H PRN Charm Rings, NP       polyethylene glycol (MIRALAX / GLYCOLAX) packet 17 g  17 g Oral Daily PRN Charm Rings, NP       simethicone (MYLICON) chewable tablet 80 mg  80 mg Oral QID PRN Charm Rings, NP       traZODone (DESYREL) tablet 50 mg  50 mg Oral QHS PRN Charm Rings, NP       PTA Medications: Medications Prior to Admission  Medication Sig Dispense Refill Last Dose/Taking   FLUoxetine (PROZAC) 10 MG capsule Take 1 capsule (10 mg total) by mouth daily. 30 capsule 0    fluticasone (FLONASE) 50 MCG/ACT nasal spray Place 2 sprays into both nostrils daily. (Patient taking differently: Place 2 sprays into both nostrils 2 (two) times daily as needed for allergies or rhinitis.) 16 g 6    levETIRAcetam (KEPPRA) 500 MG tablet Take 1 tablet (500 mg total)  by mouth 2 (two) times daily. 60 tablet 1    levocetirizine (XYZAL) 5 MG tablet Take 1 tablet (5 mg total) by mouth every evening. (Patient taking differently: Take 5 mg by mouth daily as needed for allergies.) 30 tablet 2    medroxyPROGESTERone (DEPO-PROVERA) 150 MG/ML injection Inject 150 mg into the muscle every 3 (three) months.      triamcinolone cream (KENALOG) 0.1 % Apply 1 Application topically 2 (two) times daily. (Patient taking differently: Apply 1 Application topically 2 (two) times daily as needed (Eczema).) 30 g 0     Patient Stressors:    Patient Strengths:    Treatment Modalities: Medication Management, Group therapy, Case management,  1 to 1 session with clinician, Psychoeducation, Recreational therapy.   Physician Treatment Plan for Primary Diagnosis: Major depressive disorder, recurrent severe without psychotic features (HCC) Long Term Goal(s): Improvement in symptoms so as ready for discharge   Short Term Goals: Ability to identify changes in lifestyle to reduce recurrence of condition will improve Ability to disclose and discuss suicidal ideas  Medication Management: Evaluate patient's response, side effects, and tolerance of medication regimen.  Therapeutic Interventions: 1 to 1 sessions, Unit Group sessions and Medication administration.  Evaluation of Outcomes: Not Progressing  Physician Treatment Plan for Secondary Diagnosis: Principal Problem:  Major depressive disorder, recurrent severe without psychotic features (HCC)  Long Term Goal(s): Improvement in symptoms so as ready for discharge   Short Term Goals: Ability to identify changes in lifestyle to reduce recurrence of condition will improve Ability to disclose and discuss suicidal ideas     Medication Management: Evaluate patient's response, side effects, and tolerance of medication regimen.  Therapeutic Interventions: 1 to 1 sessions, Unit Group sessions and Medication administration.  Evaluation  of Outcomes: Not Progressing   RN Treatment Plan for Primary Diagnosis: Major depressive disorder, recurrent severe without psychotic features (HCC) Long Term Goal(s): Knowledge of disease and therapeutic regimen to maintain health will improve  Short Term Goals: Ability to remain free from injury will improve, Ability to verbalize frustration and anger appropriately will improve, Ability to demonstrate self-control, Ability to participate in decision making will improve, Ability to verbalize feelings will improve, Ability to disclose and discuss suicidal ideas, Ability to identify and develop effective coping behaviors will improve, and Compliance with prescribed medications will improve  Medication Management: RN will administer medications as ordered by provider, will assess and evaluate patient's response and provide education to patient for prescribed medication. RN will report any adverse and/or side effects to prescribing provider.  Therapeutic Interventions: 1 on 1 counseling sessions, Psychoeducation, Medication administration, Evaluate responses to treatment, Monitor vital signs and CBGs as ordered, Perform/monitor CIWA, COWS, AIMS and Fall Risk screenings as ordered, Perform wound care treatments as ordered.  Evaluation of Outcomes: Not Progressing   LCSW Treatment Plan for Primary Diagnosis: Major depressive disorder, recurrent severe without psychotic features (HCC) Long Term Goal(s): Safe transition to appropriate next level of care at discharge, Engage patient in therapeutic group addressing interpersonal concerns.  Short Term Goals: Engage patient in aftercare planning with referrals and resources, Increase social support, Increase ability to appropriately verbalize feelings, Increase emotional regulation, Facilitate acceptance of mental health diagnosis and concerns, Facilitate patient progression through stages of change regarding substance use diagnoses and concerns, Identify  triggers associated with mental health/substance abuse issues, and Increase skills for wellness and recovery  Therapeutic Interventions: Assess for all discharge needs, 1 to 1 time with Social worker, Explore available resources and support systems, Assess for adequacy in community support network, Educate family and significant other(s) on suicide prevention, Complete Psychosocial Assessment, Interpersonal group therapy.  Evaluation of Outcomes: Not Progressing   Progress in Treatment: Attending groups: Yes. Participating in groups: Yes. Taking medication as prescribed: Yes. Toleration medication: Yes. Family/Significant other contact made: No, will contact:  Tracy Foley (dad) [phone] Patient understands diagnosis: Yes. Discussing patient identified problems/goals with staff: Yes. Medical problems stabilized or resolved: Yes. Denies suicidal/homicidal ideation: Yes. Issues/concerns per patient self-inventory: No.  New problem(s) identified: No, Describe:  none reported  New Short Term/Long Term Goal(s): medication stabilization, elimination of SI thoughts, development of comprehensive mental wellness plan.    Patient Goals:  "Stay on my meds"  Discharge Plan or Barriers: Patient recently admitted. CSW will continue to follow and assess for appropriate referrals and possible discharge planning.    Reason for Continuation of Hospitalization: Anxiety Depression Medication stabilization Suicidal ideation  Estimated Length of Stay: 5-7 days  Last 3 [place] Suicide Severity Risk Score: Flowsheet Row [facility] (Current) [facility]-Admission (Discharged) from 02/25/2023 in [facility] from 02/13/2023 in [facility] RISK CATEGORY No Risk No Risk No Risk  Last PHQ 2/9 Scores:    01/26/2022    4:52 PM 11/12/2021    3:51 PM 07/30/2021   11:19 AM  Depression  screen PHQ 2/9  Decreased Interest 2 2 1   Down, Depressed, Hopeless 2 2 1   PHQ - 2 Score 4 4 2   Altered sleeping 2 1 0  Tired, decreased energy 2 2 1   Change in appetite 1 1 0  Feeling bad or failure about yourself  2 3 0  Trouble concentrating 2 1 0  Moving slowly or fidgety/restless 2 1 0  Suicidal thoughts 0 0 0  PHQ-9 Score 15 13 3   Difficult doing work/chores   Not difficult at all    Scribe for Treatment Team: Kathi Der, LCSWA 03/02/2023 12:58 PM

## 2023-03-02 NOTE — Progress Notes (Signed)
   03/02/23 0601  15 Minute Checks  Location Bedroom  Visual Appearance Calm  Behavior Sleeping  Sleep (Behavioral Health Patients Only)  Calculate sleep? (Click Yes once per 24 hr at 0600 safety check) Yes  Documented sleep last 24 hours 8.75

## 2023-03-02 NOTE — B[facility] Counselor (Signed)
 Adult Comprehensive Assessment  Patient ID: Tracy Foley, female   DOB: [birth year], 28 y.o.   MRN: [id number]  Information Source: Information source: Patient  Current Stressors:  Patient states their primary concerns and needs for treatment are:: "nothing really I pay child support eery week" Patient states their goals for this hospitilization and ongoing recovery are:: "nothing" Employment / Job issues: "no work stressors" Family Relationships: "nothing we facetime and keep in Designer, multimedia / Lack of resources (include bankruptcy): "no financial concerns" Housing / Lack of housing: "nothing" Physical health (include injuries & life threatening diseases): "scoliosis, and needed physical therapy and hard breathing because it ress up against my lungs" Social relationships: "no" Substance abuse: "no" Bereavement / Loss: "no"  Living/Environment/Situation:  Living Arrangements: Alone, Children Living conditions (as described by patient or guardian): "alone with son" Who else lives in the home?: "just my son" How long has patient lived in current situation?: "since the father recently moving out a month and a half ago" What is atmosphere in current home: Comfortable  Family History:  Marital status: Single What is your sexual orientation?: heterosexual  Childhood History:  By whom was/is the patient raised?: Mother Additional childhood history information: "mom and age [age] group home and foster care" Description of patient's relationship with caregiver when they were a child: "it was okay, just upset becuase parents go divorced" Patient's description of current relationship with people who raised him/her: "mom passed away" How were you disciplined when you got in trouble as a child/adolescent?: "we didnt get disciplined" Does patient have siblings?: Yes Number of Siblings: 10 Description of patient's current relationship with siblings: "6 brother and 4 sisters" Did  patient suffer any verbal/emotional/physical/sexual abuse as a child?: No Did patient suffer from severe childhood neglect?: No Has patient ever been sexually abused/assaulted/raped as an adolescent or adult?: No Was the patient ever a victim of a crime or a disaster?: No Witnessed domestic violence?: No Has patient been affected by domestic violence as an adult?: No  Education:  Highest grade of school patient has completed: "11th grade" Currently a student?: No Learning disability?: No  Employment/Work Situation:   Employment Situation: Employed 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?: No  Financial Resources:   Surveyor, quantity resources: [employer], [employer], Income from employment Does patient have a Lawyer or guardian?: No  Alcohol/Substance Abuse:   What has been your use of drugs/alcohol within the last 12 months?: "no" pt denies use, social on holidays, birthdays If attempted suicide, did drugs/alcohol play a role in this?:  ("not a suicide attempt but i did drink while taking seizure medications") Alcohol/Substance Abuse Treatment Hx: Denies past history Has alcohol/substance abuse ever caused legal problems?: No  Social Support System:   Patient's Community Support System: Good Describe Community Support System: "i have a few people that are supportive" Type of faith/religion: "just God" How does patient's faith help to cope with current illness?: "i just started reading the bible it teaches me how to forgive, it takes  away stress and anger it gives  me peace."  Leisure/Recreation:   Do You Have Hobbies?: Yes Leisure and Hobbies: "basketball, cleaning, karoke"  Strengths/Needs:   What is the patient's perception of their strengths?: "DIYs" Patient states they can use these personal strengths during their treatment to contribute to their recovery: "inspiration and being able to put things together" Patient states these barriers may affect/interfere with their treatment: "no" Patient states these barriers may affect their return to the community: "no" Other important information patient would like considered in planning for their treatment: "no"  Discharge Plan:   Currently receiving community mental health services: No Patient states concerns and preferences for aftercare planning are: "Intensive Inhome or virtual appointments" Patient states they will know when they are safe and ready for discharge when: "i have been safe and ready i just had too much to drink" Does patient have access to transportation?: No Does patient have financial barriers related to discharge medications?: No Patient description of barriers related to discharge medications: Pt has medicaid Plan for no access to transportation at discharge: CSW to schedule transport Will patient be returning to same living situation after discharge?: Yes  Summary/Recommendations:   Summary and Recommendations (to be completed by the evaluator): Tracy Foley is a 28 year old female, single, employed, lives alone with her 7-year-old child. Patient has 4 children the other 3 is in primary custody of Fathers. Per pt charts , she has a Known history of alcohol use disorder and seizure disorder.  Presented involuntarily following a suicide attempt.  Reported to have overdosed on combination of Keppra and Tylenol.  Pt reports no identified stressors, she reported lack of transportation. Pt would benefit from intensive in home therapy.  Tracy Foley.   LCSWA 03/02/2023

## 2023-03-02 NOTE — Progress Notes (Signed)
   03/02/23 2150  Psych Admission Type (Psych Patients Only)  Admission Status Involuntary  Psychosocial Assessment  Patient Complaints Worrying (about discharge)  Eye Contact Fair  Facial Expression Anxious  Affect Appropriate to circumstance  Speech Logical/coherent  Interaction Assertive  Motor Activity Slow  Appearance/Hygiene Unremarkable  Behavior Characteristics Cooperative;Appropriate to situation  Mood Pleasant  Thought Process  Coherency WDL  Content WDL  Delusions None reported or observed  Perception WDL  Hallucination None reported or observed  Judgment WDL  Confusion None  Danger to Self  Current suicidal ideation? Denies  Agreement Not to Harm Self Yes  Danger to Others  Danger to Others None reported or observed   Progress note   D: Pt seen at nurse's station after group. Pt denies SI, HI, AVH. Pt rates pain  0/10. Pt rates anxiety  0/10 and depression  0/10. Pt states that she is worried about discharge. "I pay child support out of my check every Friday." She lives with her son. Has an amicable relationship with child's father, who is caring for him while she is in the hospital. Pt states she feels good today because she is learning a lot and engaging with others with similar issues. "I watch you guys. 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 I'm going to use that." Pt states she wants to get back to her life and she is committed to be there for her kids. "I want to get my mental health in good shape so my kids can feel supported if they need help with something like this." States that the provider told her that they needed collateral to assess her discharge plan. Updated her emergency contact with her father, Ricky's information. Encouraged pt to reach out to Child psychotherapist for issues r/t her discharge plan. No other concerns noted at this time.  A: Pt provided support and encouragement. Pt given  scheduled medication as prescribed. PRNs as appropriate. Q15 min checks for safety.   R: Pt safe on the unit. Will continue to monitor.

## 2023-03-03 DIAGNOSIS — F332 Major depressive disorder, recurrent severe without psychotic features: Secondary | ICD-10-CM | POA: Diagnosis not present

## 2023-03-03 MED ORDER — FLUOXETINE HCL 20 MG PO CAPS
20.0000 mg | ORAL_CAPSULE | Freq: Every day | ORAL | Status: DC
  Administered 2023-03-04 – 2023-03-05 (×2): 20 mg via ORAL
  Filled 2023-03-03 (×4): qty 1

## 2023-03-03 NOTE — [facility] Group Notes (Signed)
 [facility] Group Notes:  (Nursing/MHT/Case Management/Adjunct)  Date:  03/03/2023  Time:  10:40 PM  Type of Therapy:  Psychoeducational Skills  Participation Level:  Did Not Attend  Participation Quality:  Resistant  Affect:  Resistant  Cognitive:  Lacking  Insight:  None  Engagement in Group:  None  Modes of Intervention:  Education  Summary of Progress/Problems: The patient did not attend group this evening.   Hazle Coca S 03/03/2023, 10:40 PM

## 2023-03-03 NOTE — Plan of Care (Signed)

## 2023-03-03 NOTE — Progress Notes (Signed)
   03/03/23 0550  15 Minute Checks  Location Bedroom  Visual Appearance Calm  Behavior Sleeping  Sleep (Behavioral Health Patients Only)  Calculate sleep? (Click Yes once per 24 hr at 0600 safety check) Yes  Documented sleep last 24 hours 7.75

## 2023-03-03 NOTE — Progress Notes (Signed)
   03/03/23 2300  Psych Admission Type (Psych Patients Only)  Admission Status Involuntary  Psychosocial Assessment  Patient Complaints Anxiety;Worrying  Eye Contact Fair  Facial Expression Animated  Affect Appropriate to circumstance  Speech Logical/coherent  Interaction Assertive  Motor Activity Other (Comment) (WNL)  Appearance/Hygiene Unremarkable  Behavior Characteristics Cooperative;Appropriate to situation  Mood Pleasant  Thought Process  Coherency WDL  Content WDL  Delusions None reported or observed  Perception WDL  Hallucination None reported or observed  Judgment WDL  Confusion None  Danger to Self  Current suicidal ideation? Denies  Description of Suicide Plan None  Self-Injurious Behavior No self-injurious ideation or behavior indicators observed or expressed   Agreement Not to Harm Self Yes  Description of Agreement Verbal agreement  Danger to Others  Danger to Others None reported or observed

## 2023-03-03 NOTE — Plan of Care (Signed)
   Problem: Education: Goal: Emotional status will improve Outcome: Progressing Goal: Mental status will improve Outcome: Progressing

## 2023-03-03 NOTE — Group Note (Signed)
 LCSW Group Therapy Note  Group Date: 03/03/2023 Start Time: 1100 End Time: 1200  Participation:  patient was present for the second part of the group session.  Type of Therapy:  Group Therapy  Topic:  "Speaking from the Heart: Communicating with Understanding and Empathy"  Objective: To help participants develop effective communication skills to express themselves clearly, listen actively, and navigate conflicts in a healthy way.  Goals: Increase awareness of verbal and non-verbal communication skills. Practice using "I" statements and active listening techniques. Learn coping strategies for managing communication stress.  Summary: Participants explored the importance of communication, discussed challenges, and practiced skills such as active listening and assertive expression. They reflected on past experiences and identified ways to improve communication in their daily lives.  Therapeutic Modalities: Cognitive-Behavioral Therapy (CBT): Restructuring negative thought patterns in communication. Mindfulness: Staying present and calm during conversations. Psychoeducation: Learning about effective communication techniques.   Latisa Belay O Shantal Roan, LCSWA 03/03/2023  1:12 PM
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
## 2023-03-03 NOTE — B[facility] Suicide Risk Assessment (Signed)
 [facility] INPATIENT:  Family/Significant Other Suicide Prevention Education  Suicide Prevention Education:  Education Completed; Letta Kocher (dad) [phone], (name of family member/significant other) has been identified by the patient as the family member/significant other with whom the patient will be residing, and identified as the person(s) who will aid the patient in the event of a mental health crisis (suicidal ideations/suicide attempt).  With written consent from the patient, the family member/significant other has been provided the following suicide prevention education, prior to the and/or following the discharge of the patient.  The suicide prevention education provided includes the following: Suicide risk factors Suicide prevention and interventions National Suicide Hotline telephone number [facility] assessment telephone number [facility] Emergency Assistance 911 [facility] and/or Residential Mobile Crisis Unit telephone number  Request made of family/significant other to: Remove weapons (e.g., guns, rifles, knives), all items previously/currently identified as safety concern.   Remove drugs/medications (over-the-counter, prescriptions, illicit drugs), all items previously/currently identified as a safety concern.  The family member/significant other verbalizes understanding of the suicide prevention education information provided.  The family member/significant other agrees to remove the items of safety concern listed above.  Kathi Der 03/03/2023, 11:11 AM

## 2023-03-03 NOTE — Progress Notes (Signed)
   03/03/23 1300  Psych Admission Type (Psych Patients Only)  Admission Status Involuntary  Psychosocial Assessment  Patient Complaints Worrying  Eye Contact Fair  Facial Expression Animated  Affect Appropriate to circumstance  Speech Logical/coherent  Interaction Assertive  Motor Activity Slow  Appearance/Hygiene Unremarkable  Behavior Characteristics Cooperative;Appropriate to situation  Mood Pleasant  Thought Process  Coherency WDL  Content WDL  Delusions None reported or observed  Perception WDL  Hallucination None reported or observed  Judgment WDL  Confusion None  Danger to Self  Current suicidal ideation? Denies  Self-Injurious Behavior No self-injurious ideation or behavior indicators observed or expressed   Danger to Others  Danger to Others None reported or observed

## 2023-03-03 NOTE — Plan of Care (Signed)
   Problem: Education: Goal: Emotional status will improve Outcome: Progressing Goal: Mental status will improve Outcome: Progressing Goal: Verbalization of understanding the information provided will improve Outcome: Progressing   Problem: Activity: Goal: Interest or engagement in activities will improve Outcome: Progressing Goal: Sleeping patterns will improve Outcome: Progressing

## 2023-03-04 DIAGNOSIS — F332 Major depressive disorder, recurrent severe without psychotic features: Secondary | ICD-10-CM | POA: Diagnosis not present

## 2023-03-04 MED ORDER — ONDANSETRON 4 MG PO TBDP
4.0000 mg | ORAL_TABLET | Freq: Once | ORAL | Status: AC
  Administered 2023-03-04: 4 mg via ORAL
  Filled 2023-03-04 (×2): qty 1
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
## 2023-03-04 NOTE — Plan of Care (Signed)

## 2023-03-04 NOTE — Progress Notes (Signed)
 Patient has C/O Nausea. On call provider notified. Orders received. See eMAR.

## 2023-03-04 NOTE — Group Note (Signed)
 Recreation Therapy Group Note   Group Topic:Problem Solving  Group Date: 03/04/2023 Start Time: 0930 End Time: 0950 Facilitators: Avory Rahimi-McCall, LRT,CTRS Location: [address] Dayroom   Group Topic: Communication, Team Building, Problem Solving  Goal Area(s) Addresses:  Patient will effectively work with peer towards shared goal.  Patient will identify skill used to make activity successful.  Patient will identify how skills used during activity can be used to reach post d/c goals.  Patient will identify a daily goal. Patient will cooperate in goals group conversation.   Intervention: Cup [employer] bands with attached strings enough for each group member, 10 or more cups  Activity: Patient(s) were given a set of solo cups, a rubber band, and some tied strings. The objective is to build a pyramid with the cups by only using the rubber band and string to move the cups. After the activity the patient(s) are LRT debriefed and discussed what strategies worked, what didn't, and what lessons they can take from the activity and use in life post discharge.   Education Areas: Pharmacist, community, Counsellor, Discharge Planning   Education Outcome: Acknowledges education   Affect/Mood: Appropriate   Participation Level: Engaged   Participation Quality: Independent   Behavior: Appropriate   Speech/Thought Process: Focused   Insight: Good   Judgement: Good   Modes of Intervention: Problem-solving   Patient Response to Interventions:  Engaged   Education Outcome:  In group clarification offered    Clinical Observations/Individualized Feedback: Pt was bright and attentive. Pt worked along side peers in completing the activity.      Plan: Continue to engage patient in RT group sessions 2-3x/week.   Zoelle Markus-McCall, LRT,CTRS 03/04/2023 12:22 PM

## 2023-03-04 NOTE — Progress Notes (Signed)
   03/04/23 2100  Psych Admission Type (Psych Patients Only)  Admission Status Involuntary  Psychosocial Assessment  Patient Complaints Anxiety  Eye Contact Fair  Facial Expression Animated  Affect Appropriate to circumstance  Speech Logical/coherent  Interaction Assertive  Motor Activity Other (Comment) (WNL)  Appearance/Hygiene Unremarkable  Behavior Characteristics Cooperative;Appropriate to situation  Mood Pleasant  Thought Process  Coherency WDL  Content WDL  Delusions None reported or observed  Perception WDL  Hallucination None reported or observed  Judgment WDL  Confusion None  Danger to Self  Current suicidal ideation? Denies  Description of Suicide Plan None  Self-Injurious Behavior No self-injurious ideation or behavior indicators observed or expressed   Agreement Not to Harm Self Yes  Description of Agreement Verbal agreement  Danger to Others  Danger to Others None reported or observed

## 2023-03-04 NOTE — Progress Notes (Signed)
 Patient states upon discharge she will need a return to work excuse.

## 2023-03-04 NOTE — Plan of Care (Signed)
   Problem: Education: Goal: Knowledge of [facility] Education information/materials will improve Outcome: Progressing Goal: Emotional status will improve Outcome: Progressing Goal: Mental status will improve Outcome: Progressing Goal: Verbalization of understanding the information provided will improve Outcome: Progressing

## 2023-03-04 NOTE — Progress Notes (Signed)
   03/04/23 0845  Psych Admission Type (Psych Patients Only)  Admission Status Involuntary  Psychosocial Assessment  Patient Complaints Anxiety;Worrying  Eye Contact Fair  Facial Expression Animated  Affect Appropriate to circumstance  Speech Logical/coherent  Interaction Assertive  Motor Activity Other (Comment) (WDL)  Appearance/Hygiene Unremarkable  Behavior Characteristics Cooperative;Appropriate to situation  Mood Pleasant  Thought Process  Coherency WDL  Content WDL  Delusions None reported or observed  Perception WDL  Hallucination None reported or observed  Judgment WDL  Confusion None  Danger to Self  Current suicidal ideation? Denies  Agreement Not to Harm Self Yes  Description of Agreement verbal  Danger to Others  Danger to Others None reported or observed

## 2023-03-05 DIAGNOSIS — F332 Major depressive disorder, recurrent severe without psychotic features: Secondary | ICD-10-CM | POA: Diagnosis not present

## 2023-03-05 MED ORDER — FLUOXETINE HCL 20 MG PO CAPS: 20.0000 mg | ORAL_CAPSULE | Freq: Every day | ORAL | 0 refills | Status: DC

## 2023-03-05 MED ORDER — LEVETIRACETAM 500 MG PO TABS: 500.0000 mg | ORAL_TABLET | Freq: Two times a day (BID) | ORAL | 0 refills | Status: DC

## 2023-03-05 NOTE — Progress Notes (Signed)
   03/05/23 0900  Psych Admission Type (Psych Patients Only)  Admission Status Involuntary  Psychosocial Assessment  Patient Complaints None  Eye Contact Fair  Facial Expression Animated  Affect Appropriate to circumstance  Speech Logical/coherent  Interaction Assertive  Motor Activity Other (Comment) (steadygait)  Appearance/Hygiene Unremarkable  Behavior Characteristics Cooperative;Appropriate to situation  Mood Pleasant  Thought Process  Coherency WDL  Content WDL  Delusions None reported or observed  Perception WDL  Hallucination None reported or observed  Judgment WDL  Confusion None  Danger to Self  Current suicidal ideation? Denies  Self-Injurious Behavior No self-injurious ideation or behavior indicators observed or expressed   Danger to Others  Danger to Others None reported or observed

## 2023-03-05 NOTE — Progress Notes (Signed)
  [facility] Adult Case Management Discharge Plan :  Will you be returning to the same living situation after discharge:  Yes,  Patient's dad will be picking her up At discharge, do you have transportation home?: Yes,  Patient will be going back home Do you have the ability to pay for your medications: Yes,  Patient has Insurance  Release of information consent forms completed and in the chart;  Patient's signature needed at discharge.  Patient to Follow up at:  Follow-up Information     [facility] Follow up on 03/08/2023.   Why: You have a hospital follow up appointment for therapy and medication management services on 03/08/23 at 12:30 pm.  This will be a Virtual telehealth appointment. Contact information: [address]  [address] [place] [address] [phone]         [facility] Follow up.   Specialty: Pension scheme manager information: [address] [place] [address] [phone]         [facility] Follow up.   Specialty: Urgent Care Contact information: [address] [place] [address] [phone]                Next level of care provider has access to [facility] Link:no  Safety Planning and Suicide Prevention discussed: Yes,  CSW Spoke to father 03/03/2023     Has patient been referred to the Quitline?: Patient refused referral for treatment  Patient has been referred for addiction treatment: Patient refused referral for treatment; referral information given to patient at discharge.  Hanin Decook O Amyre Segundo, LCSWA 03/05/2023, 10:04 AM

## 2023-03-05 NOTE — Group Note (Signed)
 Date:  03/05/2023 Time:  4:51 PM  Group Topic/Focus:  Goals Group:   The focus of this group is to help patients establish daily goals to achieve during treatment and discuss how the patient can incorporate goal setting into their daily lives to aide in recovery. Orientation:   The focus of this group is to educate the patient on the purpose and policies of crisis stabilization and provide a format to answer questions about their admission.  The group details unit policies and expectations of patients while admitted.    Participation Level:  Active  Participation Quality:  Appropriate  Affect:  Appropriate  Cognitive:  Appropriate  Insight: Appropriate  Engagement in Group:  Engaged  Modes of Intervention:  Discussion and Orientation  Additional Comments:   Pt personal goal is to improve coping skills and to find a therapist   Tracy Foley 03/05/2023, 4:51 PM
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
## 2023-03-05 NOTE — BHH Suicide Risk Assessment (Signed)
 [facility] Discharge Suicide Risk Assessment   Principal Problem: Major depressive disorder, recurrent severe without psychotic features [facility]) Discharge Diagnoses: Principal Problem:   Major depressive disorder, recurrent severe without psychotic features (HCC)   Total Time spent with patient: 20 minutes  Musculoskeletal: Strength & Muscle Tone: within normal limits Gait & Station: normal Patient leans: N/A  Psychiatric Specialty Exam  Presentation  General Appearance:  Casually dressed, not in any distress, good relatedness, appropriate behavior.  Eye Contact: Good  Speech: Spontaneous.  Normal rate, tone and volume.  Normal prosody of speech.  Mood and Affect  Mood: Euthymic.  Affect: Full range and mood congruent.  Thought Process  Thought Processes: Organized and goal directed.  Descriptions of Associations:Intact  Orientation:Full (Time, Place and Person)  Thought Content: Future-oriented.  No suicidal thoughts.  No homicidal thoughts.  No thoughts of violence.  No negative ruminations.  No guilty ruminations.  No obsessions.  No delusional theme.  Hallucinations: No hallucination in any modality.  Sensorium  Memory: Immediate Good; Recent Good; Remote Good  Judgment: Good  Insight: Good   Executive Functions  Concentration: Good  Attention Span: Good  Recall: Good  Fund of Knowledge: Good  Language: Good   Psychomotor Activity  Normal psychomotor activity  Physical Exam: Physical Exam ROS Blood pressure 97/82, pulse 76, temperature 99 F (37.2 C), temperature source Oral, resp. rate 16, height 4\' 11"  (1.499 m), weight 58.5 kg, SpO2 97%, not currently breastfeeding. Body mass index is 26.05 kg/m.  Mental Status Per Nursing Assessment::   On Admission:  Suicidal ideation indicated by others  Demographic Factors:  Living alone  Loss Factors: Financial problems/change in socioeconomic status  Historical Factors: Family history  of mental illness or substance abuse and Impulsivity  Risk Reduction Factors:   Responsible for children under 24 years of age, Sense of responsibility to family, Employed, Positive social support, Positive therapeutic relationship, and Positive coping skills or problem solving skills  Continued Clinical Symptoms:  Patient is not exhibiting any features of depression.  There are no manic symptoms.  There are no psychotic symptoms.  Cognitive Features That Contribute To Risk:  None    Suicide Risk:  Minimal: Patient does not have any current suicidal thoughts.  No current homicidal thoughts.  No current thoughts of violence.  Modifiable risk factor targeted during this admission as depression.  Patient is on an antidepressant which she is tolerating well.  Patient plans to engage with AA and abstain from alcohol.  There are no new psychosocial stressors. Patient is currently not a danger to herself or anyone else.  At this point in time, she is stable for care at the lower setting.  Follow-up Information     [facility] Follow up on 03/08/2023.   Why: You have a hospital follow up appointment for therapy and medication management services on 03/08/23 at 12:30 pm.  This will be a Virtual telehealth appointment. Contact information: [address]  [address] [place] [address] [phone]         [facility] Follow up.   Specialty: Pension scheme manager information: [address] [place] [address] [phone]         [facility] Follow up.   Specialty: Urgent Care Contact information: [address] [place] [address] [phone]                Plan Of Care/Follow-up recommendations:  See discharge summary.  Georgiann Cocker, MD  03/05/2023, 11:14 AM

## 2023-03-05 NOTE — Progress Notes (Signed)
Pt discharged to lobby. Pt was stable and appreciative at that time. All papers and electronic prescriptions were given and valuables returned. Suicide safety plan completed. Verbal understanding expressed. Denies SI/HI and A/VH. Pt given opportunity to express concerns and ask questions. 

## 2023-03-25 ENCOUNTER — Ambulatory Visit: Payer: Medicaid Other | Admitting: Neurology

## 2023-03-25 ENCOUNTER — Encounter: Payer: Self-pay | Admitting: Neurology

## 2023-08-17 ENCOUNTER — Ambulatory Visit: Payer: MEDICAID | Admitting: *Deleted

## 2023-08-17 ENCOUNTER — Ambulatory Visit: Payer: MEDICAID

## 2023-08-17 DIAGNOSIS — R300 Dysuria: Secondary | ICD-10-CM

## 2023-08-17 DIAGNOSIS — O26899 Other specified pregnancy related conditions, unspecified trimester: Secondary | ICD-10-CM

## 2023-08-17 DIAGNOSIS — R319 Hematuria, unspecified: Secondary | ICD-10-CM

## 2023-08-17 DIAGNOSIS — Z3201 Encounter for pregnancy test, result positive: Secondary | ICD-10-CM | POA: Diagnosis not present

## 2023-08-17 DIAGNOSIS — Z3A00 Weeks of gestation of pregnancy not specified: Secondary | ICD-10-CM

## 2023-08-17 DIAGNOSIS — O26891 Other specified pregnancy related conditions, first trimester: Secondary | ICD-10-CM

## 2023-08-17 DIAGNOSIS — N390 Urinary tract infection, site not specified: Secondary | ICD-10-CM

## 2023-08-17 DIAGNOSIS — Z3200 Encounter for pregnancy test, result unknown: Secondary | ICD-10-CM

## 2023-08-17 DIAGNOSIS — Z3687 Encounter for antenatal screening for uncertain dates: Secondary | ICD-10-CM

## 2023-08-17 LAB — POCT URINALYSIS DIP (DEVICE)
Bilirubin Urine: NEGATIVE
Glucose, UA: NEGATIVE mg/dL
Ketones, ur: NEGATIVE mg/dL
Nitrite: POSITIVE — AB
Protein, ur: NEGATIVE mg/dL
Specific Gravity, Urine: 1.01 (ref 1.005–1.030)
Urobilinogen, UA: 0.2 mg/dL (ref 0.0–1.0)
pH: 5.5 (ref 5.0–8.0)

## 2023-08-17 LAB — POCT PREGNANCY, URINE: Preg Test, Ur: POSITIVE — AB

## 2023-08-17 MED ORDER — CEFADROXIL 500 MG PO CAPS: 500.0000 mg | ORAL_CAPSULE | Freq: Two times a day (BID) | ORAL | 0 refills | Status: AC

## 2023-08-17 MED ORDER — PROMETHAZINE HCL 25 MG PO TABS: 25.0000 mg | ORAL_TABLET | Freq: Four times a day (QID) | ORAL | 0 refills | Status: DC | PRN

## 2023-08-17 NOTE — Progress Notes (Signed)
 Patient dropped off a urine for a pregnancy test which was positive. I called patient to notify and heard a message  the number you dialed is not in service.  Rock Skip PEAK 3:26 I called patient again at number she provided and again heard  the number you dialed is not in sevice. I called her home number and she answered. I informed her that her Upt was positive so I can confirm she is pregnant. She reports she is unsure when her last period was- might have been in March, April, May. She plans to go here for prenatal care. I offered dating US  and scheduled for first available on 08/31/23. I advised she can schedule new ob once we verify how far she is I advised her to start a prenatal vitamin or gummy. She also c/o urinating often a small amount frequently and it is painful. I explained I can do a UA. UA completed and positive for nitrites, leukocytes, blood. I explained we will send for culture but in the meantime we can start an antibiotic since she is in pain. Due to unknown LMP/ EDD will start Duricef. Explained she should take until completed and that we may call her and change antibiotic if cultures shows other antibiotic is better. She also c/o nausea and request med. Phenergan  sent in per protocol. Rock Skip PEAK

## 2023-08-20 LAB — URINE CULTURE, OB REFLEX

## 2023-08-20 LAB — CULTURE, OB URINE

## 2023-08-31 ENCOUNTER — Ambulatory Visit (INDEPENDENT_AMBULATORY_CARE_PROVIDER_SITE_OTHER): Payer: MEDICAID

## 2023-08-31 ENCOUNTER — Other Ambulatory Visit: Payer: Self-pay | Admitting: Family Medicine

## 2023-08-31 DIAGNOSIS — Z3687 Encounter for antenatal screening for uncertain dates: Secondary | ICD-10-CM | POA: Diagnosis not present

## 2023-08-31 DIAGNOSIS — Z3A11 11 weeks gestation of pregnancy: Secondary | ICD-10-CM

## 2023-08-31 DIAGNOSIS — N390 Urinary tract infection, site not specified: Secondary | ICD-10-CM

## 2023-08-31 DIAGNOSIS — O26899 Other specified pregnancy related conditions, unspecified trimester: Secondary | ICD-10-CM

## 2023-08-31 DIAGNOSIS — Z3200 Encounter for pregnancy test, result unknown: Secondary | ICD-10-CM

## 2023-08-31 DIAGNOSIS — R300 Dysuria: Secondary | ICD-10-CM

## 2023-09-28 ENCOUNTER — Encounter: Payer: Self-pay | Admitting: General Practice

## 2023-09-29 ENCOUNTER — Encounter: Payer: Self-pay | Admitting: General Practice

## 2023-09-29 ENCOUNTER — Telehealth (INDEPENDENT_AMBULATORY_CARE_PROVIDER_SITE_OTHER): Payer: MEDICAID | Admitting: General Practice

## 2023-09-29 DIAGNOSIS — O0990 Supervision of high risk pregnancy, unspecified, unspecified trimester: Secondary | ICD-10-CM | POA: Insufficient documentation

## 2023-09-29 DIAGNOSIS — Z3A15 15 weeks gestation of pregnancy: Secondary | ICD-10-CM

## 2023-09-29 DIAGNOSIS — O0992 Supervision of high risk pregnancy, unspecified, second trimester: Secondary | ICD-10-CM

## 2023-09-29 MED ORDER — BLOOD PRESSURE KIT DEVI: 1.0000 | Freq: Once | 0 refills | Status: AC

## 2023-09-29 MED ORDER — GOJJI WEIGHT SCALE MISC: 1.0000 | Freq: Once | 0 refills | Status: AC

## 2023-09-29 MED ORDER — DOXYLAMINE-PYRIDOXINE 10-10 MG PO TBEC: DELAYED_RELEASE_TABLET | ORAL | 0 refills | Status: DC

## 2023-09-29 NOTE — Progress Notes (Signed)
 New OB Intake  I connected with Tracy Foley  on 09/29/23 at  2:15 PM EDT by MyChart Video Visit and verified that I am speaking with the correct person using two identifiers. Nurse is located at [facility] and pt is located at home.  I discussed the limitations, risks, security and privacy concerns of performing an evaluation and management service by telephone and the availability of in person appointments. I also discussed with the patient that there may be a patient responsible charge related to this service. The patient expressed understanding and agreed to proceed.  I explained I am completing New OB Intake today. We discussed EDD of 03/20/24 based on US  at 11 weeks. Pt is [id number]. I reviewed her allergies, medications and Medical/Surgical/OB history.    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 Diclegis  sent per protocol   Delivery Plans Plans to deliver at [facility]. Discussed the nature of our practice with multiple providers including residents and students as well as female and female providers. Due to the size of the practice, the delivering provider may not be the same as those providing prenatal care.    MyChart/Babyscripts MyChart access verified. I explained pt will have some visits in office and some virtually. Babyscripts instructions given and order  placed. Patient verifies receipt of registration text/e-mail. Account successfully created and app downloaded. If patient is a candidate for Optimized scheduling, add to sticky note.   Blood Pressure Cuff/Weight Scale Blood pressure cuff ordered for patient to pick-up from [employer]. Explained after first prenatal appt pt will check weekly and document in Babyscripts. Patient does not have weight scale; order sent to [facility], patient may track weight weekly in Babyscripts.  Anatomy US  Explained first scheduled US  will be around 19 weeks. Message sent to MFM to schedule.  Is patient a CenteringPregnancy candidate?  Not a candidate due to Complex coordination of care needed    Is patient a Mom+Baby Combined Care candidate?  Not a candidate     Is patient a candidate for Babyscripts Optimization? No, due to high risk   First visit review I reviewed new OB appt with patient. Explained pt will be seen by Dr Cresenzo at first visit. Discussed [place] genetic screening with patient. Patient desires [employer] and [employer]. Routine prenatal labs to be drawn at new OB   Last Pap Diagnosis  Date Value Ref Range Status  11/12/2021   Final   - Negative for intraepithelial lesion or malignancy (NILM)    Elenor Mole, RN 09/29/2023  2:56 PM

## 2023-09-29 NOTE — Patient Instructions (Signed)

## 2023-10-05 ENCOUNTER — Ambulatory Visit: Payer: MEDICAID | Admitting: Family Medicine

## 2023-10-05 ENCOUNTER — Other Ambulatory Visit (HOSPITAL_COMMUNITY)
Admission: RE | Admit: 2023-10-05 | Discharge: 2023-10-05 | Disposition: A | Discharge status: Home / Self Care | Payer: MEDICAID | Source: Ambulatory Visit | Attending: Family Medicine | Admitting: Family Medicine

## 2023-10-05 ENCOUNTER — Other Ambulatory Visit: Payer: Self-pay

## 2023-10-05 VITALS — BP 94/79 | HR 101 | Wt 124.0 lb

## 2023-10-05 DIAGNOSIS — G40909 Epilepsy, unspecified, not intractable, without status epilepticus: Secondary | ICD-10-CM

## 2023-10-05 DIAGNOSIS — O0992 Supervision of high risk pregnancy, unspecified, second trimester: Secondary | ICD-10-CM | POA: Diagnosis not present

## 2023-10-05 DIAGNOSIS — O99354 Diseases of the nervous system complicating childbirth: Secondary | ICD-10-CM

## 2023-10-05 DIAGNOSIS — O0990 Supervision of high risk pregnancy, unspecified, unspecified trimester: Secondary | ICD-10-CM | POA: Diagnosis present

## 2023-10-05 DIAGNOSIS — Z8759 Personal history of other complications of pregnancy, childbirth and the puerperium: Secondary | ICD-10-CM

## 2023-10-05 DIAGNOSIS — Z98891 History of uterine scar from previous surgery: Secondary | ICD-10-CM | POA: Diagnosis not present

## 2023-10-05 DIAGNOSIS — F332 Major depressive disorder, recurrent severe without psychotic features: Secondary | ICD-10-CM

## 2023-10-05 DIAGNOSIS — Z3A16 16 weeks gestation of pregnancy: Secondary | ICD-10-CM

## 2023-10-05 DIAGNOSIS — F32A Depression, unspecified: Secondary | ICD-10-CM

## 2023-10-05 DIAGNOSIS — O0991 Supervision of high risk pregnancy, unspecified, first trimester: Secondary | ICD-10-CM

## 2023-10-05 MED ORDER — ASPIRIN 81 MG PO TBEC: 81.0000 mg | DELAYED_RELEASE_TABLET | Freq: Every day | ORAL | 12 refills | Status: DC

## 2023-10-05 MED ORDER — LEVETIRACETAM 500 MG PO TABS: 500.0000 mg | ORAL_TABLET | Freq: Two times a day (BID) | ORAL | 0 refills | Status: DC

## 2023-10-05 NOTE — Patient Instructions (Signed)
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
## 2023-10-06 ENCOUNTER — Other Ambulatory Visit: Payer: MEDICAID

## 2023-10-06 LAB — PROTEIN / CREATININE RATIO, URINE
Creatinine, Urine: 54.5 mg/dL
Protein, Ur: 8.4 mg/dL
Protein/Creat Ratio: 154 mg/g{creat} (ref 0–200)

## 2023-10-07 LAB — GC/CHLAMYDIA PROBE AMP (~~LOC~~) NOT AT ARMC
Chlamydia: NEGATIVE
Comment: NEGATIVE
Comment: NORMAL
Neisseria Gonorrhea: NEGATIVE

## 2023-10-07 LAB — URINE CULTURE, OB REFLEX

## 2023-10-07 LAB — CULTURE, OB URINE

## 2023-10-11 ENCOUNTER — Other Ambulatory Visit: Payer: MEDICAID

## 2023-10-13 ENCOUNTER — Inpatient Hospital Stay (HOSPITAL_COMMUNITY)
Admission: AD | Admit: 2023-10-13 | Discharge: 2023-10-15 | DRG: 805 | Disposition: A | Discharge status: Home / Self Care | Payer: MEDICAID | Attending: Obstetrics and Gynecology | Admitting: Obstetrics and Gynecology

## 2023-10-13 ENCOUNTER — Other Ambulatory Visit: Payer: Self-pay

## 2023-10-13 ENCOUNTER — Encounter (HOSPITAL_COMMUNITY): Payer: Self-pay | Admitting: Obstetrics & Gynecology

## 2023-10-13 ENCOUNTER — Inpatient Hospital Stay (HOSPITAL_COMMUNITY): Payer: MEDICAID

## 2023-10-13 DIAGNOSIS — Z3A17 17 weeks gestation of pregnancy: Secondary | ICD-10-CM

## 2023-10-13 DIAGNOSIS — O4692 Antepartum hemorrhage, unspecified, second trimester: Secondary | ICD-10-CM

## 2023-10-13 DIAGNOSIS — O42912 Preterm premature rupture of membranes, unspecified as to length of time between rupture and onset of labor, second trimester: Secondary | ICD-10-CM | POA: Diagnosis present

## 2023-10-13 DIAGNOSIS — O3432 Maternal care for cervical incompetence, second trimester: Principal | ICD-10-CM | POA: Diagnosis present

## 2023-10-13 DIAGNOSIS — F332 Major depressive disorder, recurrent severe without psychotic features: Secondary | ICD-10-CM | POA: Diagnosis present

## 2023-10-13 DIAGNOSIS — O3430 Maternal care for cervical incompetence, unspecified trimester: Secondary | ICD-10-CM | POA: Diagnosis present

## 2023-10-13 DIAGNOSIS — O021 Missed abortion: Principal | ICD-10-CM | POA: Diagnosis present

## 2023-10-13 DIAGNOSIS — Z87891 Personal history of nicotine dependence: Secondary | ICD-10-CM

## 2023-10-13 DIAGNOSIS — O99342 Other mental disorders complicating pregnancy, second trimester: Secondary | ICD-10-CM | POA: Diagnosis present

## 2023-10-13 LAB — CBC WITH DIFFERENTIAL/PLATELET
Abs Immature Granulocytes: 0.1 K/uL — ABNORMAL HIGH (ref 0.00–0.07)
Basophils Absolute: 0 K/uL (ref 0.0–0.1)
Basophils Relative: 0 %
Eosinophils Absolute: 0 K/uL (ref 0.0–0.5)
Eosinophils Relative: 0 %
HCT: 42.8 % (ref 36.0–46.0)
Hemoglobin: 14.5 g/dL (ref 12.0–15.0)
Immature Granulocytes: 1 %
Lymphocytes Relative: 7 %
Lymphs Abs: 1.3 K/uL (ref 0.7–4.0)
MCH: 28.2 pg (ref 26.0–34.0)
MCHC: 33.9 g/dL (ref 30.0–36.0)
MCV: 83.1 fL (ref 80.0–100.0)
Monocytes Absolute: 0.7 K/uL (ref 0.1–1.0)
Monocytes Relative: 4 %
Neutro Abs: 16 K/uL — ABNORMAL HIGH (ref 1.7–7.7)
Neutrophils Relative %: 88 %
Platelets: 274 K/uL (ref 150–400)
RBC: 5.15 MIL/uL — ABNORMAL HIGH (ref 3.87–5.11)
RDW: 14.2 % (ref 11.5–15.5)
WBC: 18.1 K/uL — ABNORMAL HIGH (ref 4.0–10.5)
nRBC: 0 % (ref 0.0–0.2)

## 2023-10-13 LAB — TYPE AND SCREEN
ABO/RH(D): O POS
Antibody Screen: NEGATIVE

## 2023-10-13 MED ORDER — DOCUSATE SODIUM 100 MG PO CAPS
100.0000 mg | ORAL_CAPSULE | Freq: Every day | ORAL | Status: DC
  Administered 2023-10-13: 100 mg via ORAL
  Filled 2023-10-13 (×2): qty 1

## 2023-10-13 MED ORDER — PRENATAL MULTIVITAMIN CH
1.0000 | ORAL_TABLET | Freq: Every day | ORAL | Status: DC
  Administered 2023-10-13 – 2023-10-15 (×2): 1 via ORAL
  Filled 2023-10-13 (×2): qty 1

## 2023-10-13 MED ORDER — LACTATED RINGERS IV SOLN: INTRAVENOUS | Status: AC

## 2023-10-13 MED ORDER — SERTRALINE HCL 50 MG PO TABS
100.0000 mg | ORAL_TABLET | Freq: Every day | ORAL | Status: DC
Start: 2023-10-13 — End: 2023-10-15
  Administered 2023-10-13 – 2023-10-15 (×2): 100 mg via ORAL
  Filled 2023-10-13 (×2): qty 2

## 2023-10-13 MED ORDER — HYDROXYZINE HCL 10 MG PO TABS
10.0000 mg | ORAL_TABLET | Freq: Four times a day (QID) | ORAL | Status: DC | PRN
Start: 2023-10-13 — End: 2023-10-15
  Administered 2023-10-13: 10 mg via ORAL
  Filled 2023-10-13: qty 1

## 2023-10-13 MED ORDER — FENTANYL CITRATE (PF) 100 MCG/2ML IJ SOLN
100.0000 ug | INTRAMUSCULAR | Status: DC | PRN
  Administered 2023-10-13: 100 ug via INTRAMUSCULAR
  Filled 2023-10-13: qty 2

## 2023-10-13 MED ORDER — LACTATED RINGERS IV BOLUS: 1000.0000 mL | Freq: Once | INTRAVENOUS | Status: DC

## 2023-10-13 MED ORDER — INDOMETHACIN 25 MG PO CAPS: 100.0000 mg | ORAL_CAPSULE | Freq: Once | ORAL | Status: DC

## 2023-10-13 MED ORDER — INDOMETHACIN 25 MG PO CAPS
100.0000 mg | ORAL_CAPSULE | Freq: Once | ORAL | Status: AC
  Administered 2023-10-13: 100 mg via ORAL
  Filled 2023-10-13: qty 4

## 2023-10-13 MED ORDER — INDOMETHACIN 25 MG PO CAPS
25.0000 mg | ORAL_CAPSULE | Freq: Four times a day (QID) | ORAL | Status: DC
Start: 2023-10-13 — End: 2023-10-13

## 2023-10-13 MED ORDER — FENTANYL CITRATE (PF) 100 MCG/2ML IJ SOLN
100.0000 ug | INTRAMUSCULAR | Status: DC | PRN
Start: 2023-10-13 — End: 2023-10-13
  Filled 2023-10-13: qty 2

## 2023-10-13 MED ORDER — ONDANSETRON HCL 4 MG/2ML IJ SOLN
4.0000 mg | Freq: Four times a day (QID) | INTRAMUSCULAR | Status: DC | PRN
  Administered 2023-10-14 (×2): 4 mg via INTRAVENOUS
  Filled 2023-10-13 (×2): qty 2

## 2023-10-13 MED ORDER — ACETAMINOPHEN 325 MG PO TABS
650.0000 mg | ORAL_TABLET | ORAL | Status: DC | PRN
  Administered 2023-10-13 – 2023-10-14 (×2): 650 mg via ORAL
  Filled 2023-10-13 (×2): qty 2

## 2023-10-13 MED ORDER — CALCIUM CARBONATE ANTACID 500 MG PO CHEW: 2.0000 | CHEWABLE_TABLET | ORAL | Status: DC | PRN

## 2023-10-13 MED ORDER — FENTANYL CITRATE (PF) 100 MCG/2ML IJ SOLN
50.0000 ug | INTRAMUSCULAR | Status: DC | PRN
  Administered 2023-10-13: 50 ug via INTRAVENOUS

## 2023-10-13 MED ORDER — INDOMETHACIN 25 MG PO CAPS
25.0000 mg | ORAL_CAPSULE | Freq: Four times a day (QID) | ORAL | Status: DC
  Administered 2023-10-13 – 2023-10-14 (×3): 25 mg via ORAL
  Filled 2023-10-13 (×6): qty 1

## 2023-10-13 NOTE — MAU Note (Addendum)
 Tracy Foley is a 28 y.o. at [redacted]w[redacted]d here in MAU reporting: here by EMS - VB with small clots and lower abdominal pain that comes and goes since 0500.  Denies LOF.   LMP: NA Onset of complaint: 0500 Pain score: 8 Vitals:   10/13/23 0605  BP: 102/61  Pulse: 98  Resp: 18  Temp: 98.1 F (36.7 C)  SpO2: 97%     FHT: 172  Lab orders placed from triage: UA - pt states she is unable to void at this time.
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
## 2023-10-13 NOTE — Progress Notes (Signed)
 Patient ID: Tracy Foley, female   DOB: [birth year], 28 y.o.   MRN: [id number] Delayed note entry  Patient seen around 9 am with continued cramping pain. At her bedside are her aunt and uncle. Reviewed ultrasound findings with the patient and offered to perform a sterile speculum exam following administration of pain medication  SSE: Membranes visible 2-3 cm from introitus upon entry with clear speculum. Minimal vaginal bleeding on pad  A/P 28 yo [id number] at [redacted]w[redacted]d with prolapsing membranes - Reviewed ultrasound findings with patient and her family - Reviewed physical exam findings - Discussed high probability of inevitable loss - Emotional support provided to patient and family who want all possible interventions - Discussed limited options of interventions at this gestational age.  - Will place patient in trendelenburg and start indocin for 24 hours. Case discussed with Dr. William, on call MFM, who agrees with current plan. Will rescan patient tomorrow afternoon or Saturday, if not delivered

## 2023-10-13 NOTE — Progress Notes (Signed)
   10/13/23 1140  Spiritual Encounters  Type of Visit Attempt (pt unavailable)  Conversation partners present during encounter Nurse  Referral source Nurse (RN/NT/LPN)  Reason for visit Grief/loss   Chaplain attempted to visit pt following page from RN. Patient was asleep.

## 2023-10-14 ENCOUNTER — Encounter (HOSPITAL_COMMUNITY): Payer: Self-pay | Admitting: Obstetrics and Gynecology

## 2023-10-14 ENCOUNTER — Inpatient Hospital Stay (HOSPITAL_COMMUNITY): Payer: MEDICAID | Admitting: Anesthesiology

## 2023-10-14 DIAGNOSIS — Z3A17 17 weeks gestation of pregnancy: Secondary | ICD-10-CM

## 2023-10-14 DIAGNOSIS — O99344 Other mental disorders complicating childbirth: Secondary | ICD-10-CM | POA: Diagnosis not present

## 2023-10-14 DIAGNOSIS — O3430 Maternal care for cervical incompetence, unspecified trimester: Secondary | ICD-10-CM | POA: Diagnosis present

## 2023-10-14 DIAGNOSIS — O021 Missed abortion: Secondary | ICD-10-CM | POA: Diagnosis present

## 2023-10-14 DIAGNOSIS — Z87891 Personal history of nicotine dependence: Secondary | ICD-10-CM | POA: Diagnosis not present

## 2023-10-14 DIAGNOSIS — O3432 Maternal care for cervical incompetence, second trimester: Secondary | ICD-10-CM | POA: Diagnosis present

## 2023-10-14 DIAGNOSIS — O42912 Preterm premature rupture of membranes, unspecified as to length of time between rupture and onset of labor, second trimester: Secondary | ICD-10-CM | POA: Diagnosis present

## 2023-10-14 DIAGNOSIS — O364XX0 Maternal care for intrauterine death, not applicable or unspecified: Secondary | ICD-10-CM | POA: Diagnosis not present

## 2023-10-14 DIAGNOSIS — O99342 Other mental disorders complicating pregnancy, second trimester: Secondary | ICD-10-CM | POA: Diagnosis present

## 2023-10-14 DIAGNOSIS — F332 Major depressive disorder, recurrent severe without psychotic features: Secondary | ICD-10-CM | POA: Diagnosis present

## 2023-10-14 DIAGNOSIS — O42012 Preterm premature rupture of membranes, onset of labor within 24 hours of rupture, second trimester: Secondary | ICD-10-CM | POA: Diagnosis not present

## 2023-10-14 LAB — URINALYSIS, ROUTINE W REFLEX MICROSCOPIC
Bilirubin Urine: NEGATIVE
Glucose, UA: NEGATIVE mg/dL
Hgb urine dipstick: NEGATIVE
Ketones, ur: 20 mg/dL — AB
Leukocytes,Ua: NEGATIVE
Nitrite: NEGATIVE
Protein, ur: NEGATIVE mg/dL
Specific Gravity, Urine: 1.004 — ABNORMAL LOW (ref 1.005–1.030)
pH: 6 (ref 5.0–8.0)

## 2023-10-14 LAB — CBC
HCT: 33.4 % — ABNORMAL LOW (ref 36.0–46.0)
Hemoglobin: 11.2 g/dL — ABNORMAL LOW (ref 12.0–15.0)
MCH: 27.9 pg (ref 26.0–34.0)
MCHC: 33.5 g/dL (ref 30.0–36.0)
MCV: 83.3 fL (ref 80.0–100.0)
Platelets: 279 K/uL (ref 150–400)
RBC: 4.01 MIL/uL (ref 3.87–5.11)
RDW: 13.8 % (ref 11.5–15.5)
WBC: 16.7 K/uL — ABNORMAL HIGH (ref 4.0–10.5)
nRBC: 0 % (ref 0.0–0.2)

## 2023-10-14 MED ORDER — EPHEDRINE 5 MG/ML INJ: 10.0000 mg | INTRAVENOUS | Status: DC | PRN

## 2023-10-14 MED ORDER — MISOPROSTOL 200 MCG PO TABS
800.0000 ug | ORAL_TABLET | Freq: Once | ORAL | Status: DC
Start: 2023-10-14 — End: 2023-10-15

## 2023-10-14 MED ORDER — METHYLERGONOVINE MALEATE 0.2 MG/ML IJ SOLN
INTRAMUSCULAR | Status: AC
  Filled 2023-10-14: qty 1

## 2023-10-14 MED ORDER — OXYCODONE-ACETAMINOPHEN 5-325 MG PO TABS: 1.0000 | ORAL_TABLET | ORAL | Status: DC | PRN

## 2023-10-14 MED ORDER — SIMETHICONE 80 MG PO CHEW
80.0000 mg | CHEWABLE_TABLET | Freq: Once | ORAL | Status: DC
Start: 2023-10-14 — End: 2023-10-15
  Filled 2023-10-14: qty 1

## 2023-10-14 MED ORDER — PHENYLEPHRINE 80 MCG/ML (10ML) SYRINGE FOR IV PUSH (FOR BLOOD PRESSURE SUPPORT): 80.0000 ug | PREFILLED_SYRINGE | INTRAVENOUS | Status: DC | PRN

## 2023-10-14 MED ORDER — LIDOCAINE HCL (PF) 1 % IJ SOLN
INTRAMUSCULAR | Status: DC | PRN
  Administered 2023-10-14: 11 mL via EPIDURAL

## 2023-10-14 MED ORDER — MISOPROSTOL 200 MCG PO TABS
400.0000 ug | ORAL_TABLET | ORAL | Status: DC
  Administered 2023-10-14: 400 ug via ORAL
  Filled 2023-10-14: qty 2

## 2023-10-14 MED ORDER — METHYLERGONOVINE MALEATE 0.2 MG/ML IJ SOLN
0.2000 mg | INTRAMUSCULAR | Status: DC | PRN
Start: 2023-10-14 — End: 2023-10-15
  Administered 2023-10-14: 0.2 mg via INTRAMUSCULAR

## 2023-10-14 MED ORDER — ACETAMINOPHEN 325 MG PO TABS: 650.0000 mg | ORAL_TABLET | ORAL | Status: DC | PRN

## 2023-10-14 MED ORDER — LIDOCAINE HCL (PF) 1 % IJ SOLN: 30.0000 mL | INTRAMUSCULAR | Status: DC | PRN

## 2023-10-14 MED ORDER — SOD CITRATE-CITRIC ACID 500-334 MG/5ML PO SOLN: 30.0000 mL | ORAL | Status: DC | PRN

## 2023-10-14 MED ORDER — OXYTOCIN BOLUS FROM INFUSION
333.0000 mL | Freq: Once | INTRAVENOUS | Status: AC
  Administered 2023-10-14: 333 mL via INTRAVENOUS

## 2023-10-14 MED ORDER — LACTATED RINGERS IV SOLN
500.0000 mL | Freq: Once | INTRAVENOUS | Status: AC
  Administered 2023-10-14: 500 mL via INTRAVENOUS

## 2023-10-14 MED ORDER — DIPHENHYDRAMINE HCL 50 MG/ML IJ SOLN: 12.5000 mg | INTRAMUSCULAR | Status: DC | PRN

## 2023-10-14 MED ORDER — MISOPROSTOL 200 MCG PO TABS: 400.0000 ug | ORAL_TABLET | ORAL | Status: DC

## 2023-10-14 MED ORDER — MISOPROSTOL 200 MCG PO TABS
400.0000 ug | ORAL_TABLET | ORAL | Status: DC
  Filled 2023-10-14: qty 2

## 2023-10-14 MED ORDER — FENTANYL CITRATE (PF) 100 MCG/2ML IJ SOLN: 50.0000 ug | INTRAMUSCULAR | Status: DC | PRN

## 2023-10-14 MED ORDER — LACTATED RINGERS IV SOLN: INTRAVENOUS | Status: AC

## 2023-10-14 MED ORDER — LACTATED RINGERS IV SOLN: 500.0000 mL | INTRAVENOUS | Status: AC | PRN

## 2023-10-14 MED ORDER — FENTANYL-BUPIVACAINE-NACL 0.5-0.125-0.9 MG/250ML-% EP SOLN
12.0000 mL/h | EPIDURAL | Status: DC | PRN
  Administered 2023-10-14: 12 mL/h via EPIDURAL
  Filled 2023-10-14: qty 250

## 2023-10-14 MED ORDER — LORAZEPAM 2 MG/ML IJ SOLN
1.0000 mg | Freq: Once | INTRAMUSCULAR | Status: AC
  Administered 2023-10-14: 1 mg via INTRAVENOUS
  Filled 2023-10-14: qty 1

## 2023-10-14 MED ORDER — OXYTOCIN-SODIUM CHLORIDE 30-0.9 UT/500ML-% IV SOLN
2.5000 [IU]/h | INTRAVENOUS | Status: DC
  Filled 2023-10-14: qty 500

## 2023-10-14 MED ORDER — METHYLERGONOVINE MALEATE 0.2 MG PO TABS
0.2000 mg | ORAL_TABLET | ORAL | Status: DC | PRN
Start: 2023-10-14 — End: 2023-10-15

## 2023-10-14 MED ORDER — LEVETIRACETAM 500 MG PO TABS
500.0000 mg | ORAL_TABLET | Freq: Two times a day (BID) | ORAL | Status: DC
  Administered 2023-10-14 – 2023-10-15 (×3): 500 mg via ORAL
  Filled 2023-10-14 (×5): qty 1

## 2023-10-14 NOTE — Plan of Care (Signed)
  Problem: Education: Goal: Knowledge of disease or condition will improve Outcome: Progressing Goal: Knowledge of the prescribed therapeutic regimen will improve Outcome: Progressing Goal: Individualized Educational Video(s) Outcome: Progressing   Problem: Clinical Measurements: Goal: Complications related to the disease process, condition or treatment will be avoided or minimized Outcome: Progressing   Problem: Education: Goal: Knowledge of General Education information will improve Description: Including pain rating scale, medication(s)/side effects and non-pharmacologic comfort measures Outcome: Progressing

## 2023-10-14 NOTE — Discharge Summary (Signed)
 Postpartum Discharge Summary  Date of Service updated 10/15/23     Patient Name: Tracy Foley DOB: September 05, 1995 MRN: [id number]  Date of admission: 10/13/2023 Delivery date:10/14/2023 Delivering provider: JOMARIE CAMPI A Date of discharge: 10/15/2023  Admitting diagnosis: Preterm labor in second trimester [O60.02] Incompetent cervix in pregnancy, antepartum [O34.30] Intrauterine pregnancy: [redacted]w[redacted]d     Secondary diagnosis:  Principal Problem:   Preterm labor in second trimester Active Problems:   Major depressive disorder, recurrent severe without psychotic features (HCC)   Incompetent cervix in pregnancy, antepartum   Fetal demise before 20 weeks with retention of dead fetus  Additional problems: major depression, concerns for suicidal risk , voluntary inpatient admission to behavioral health at time of discharge   Discharge diagnosis: Intrauterine fetal demise                                              Post partum procedures:none Augmentation: Cytotec Complications: [facility] course: Induction of Labor With Vaginal Delivery   28 y.o. yo [id number] at [redacted]w[redacted]d was admitted to the hospital 10/13/2023 for induction of labor.  Indication for induction: PPROM w IUFD.  Patient had an labor course complicated by PPROM w/ IUFD Membrane Rupture Time/Date: 8:40 AM,10/14/2023  Delivery Method:Vaginal, Spontaneous Operative Delivery:N/A Episiotomy: None Lacerations:  None Details of delivery can be found in separate delivery note.  Patient had an uncomplicated postpartum course.    Patient is discharged, transferred to [facility] to [facility] 10/15/23.  Newborn Data: Birth date:10/14/2023 Birth time:8:44 PM Gender:Unknown Living status:Fetal Demise Apgars: ,  Weight:15 g  Magnesium  Sulfate received: No BMZ received: No Rhophylac:N/A MMR:No T-DaP:no Flu: No RSV Vaccine received: No Transfusion:No  Immunizations received: There is no immunization history  for the selected administration types on file for this patient.  Physical exam  Vitals:   10/15/23 0806 10/15/23 0807 10/15/23 1224 10/15/23 1534  BP: (!) 89/52  100/63 115/74  Pulse: 60  83 99  Resp:  13 14 15   Temp:  98.2 F (36.8 C) 98.2 F (36.8 C) 97.9 F (36.6 C)  TempSrc:  Oral Oral Oral  SpO2:  98% 95% 99%  Weight:      Height:       General: alert, cooperative, and no distress Lochia: appropriate Uterine Fundus: firm Incision: N/A DVT Evaluation: No evidence of DVT seen on physical exam. Labs: Lab Results  Component Value Date   WBC 15.9 (H) 10/15/2023   HGB 10.6 (L) 10/15/2023   HCT 31.8 (L) 10/15/2023   MCV 83.5 10/15/2023   PLT 251 10/15/2023      Latest Ref Rng & Units 02/27/2023    4:56 AM  CMP  Glucose 70 - 99 mg/dL 88   BUN 6 - 20 mg/dL 9   Creatinine 9.55 - 8.99 mg/dL 9.46   Sodium 864 - 854 mmol/L 137   Potassium 3.5 - 5.1 mmol/L 4.4   Chloride 98 - 111 mmol/L 109   CO2 22 - 32 mmol/L 18   Calcium  8.9 - 10.3 mg/dL 9.1    Edinburgh Score:    10/15/2023   10:05 AM  Edinburgh Postnatal Depression Scale Screening Tool  I have been able to laugh and see the funny side of things. 1  I have looked forward with enjoyment to things. 2  I have blamed myself unnecessarily when things went wrong. 0  I  have been anxious or worried for no good reason. 2  I have felt scared or panicky for no good reason. 1  Things have been getting on top of me. 1  I have been so unhappy that I have had difficulty sleeping. 0  I have felt sad or miserable. 1  I have been so unhappy that I have been crying. 1  The thought of harming myself has occurred to me. 0  Edinburgh Postnatal Depression Scale Total 9   Edinburgh Postnatal Depression Scale Total: 9   After visit meds:  Allergies as of 10/15/2023   No Known Allergies      Medication List     STOP taking these medications    aspirin EC 81 MG tablet   Doxylamine -Pyridoxine  10-10 MG Tbec Commonly known  as: Diclegis        TAKE these medications    acetaminophen  325 MG tablet Commonly known as: Tylenol  Take 2 tablets (650 mg total) by mouth every 4 (four) hours as needed (for pain scale < 4).   benzocaine-Menthol  20-0.5 % Aero Commonly known as: DERMOPLAST Apply 1 Application topically as needed for irritation (perineal discomfort).   cefadroxil  500 MG capsule Commonly known as: DURICEF Take 1 capsule (500 mg total) by mouth 2 (two) times daily for 7 days.   ibuprofen  600 MG tablet Commonly known as: ADVIL  Take 1 tablet (600 mg total) by mouth every 6 (six) hours as needed.   levETIRAcetam  500 MG tablet Commonly known as: Keppra  Take 1 tablet (500 mg total) by mouth 2 (two) times daily.   multivitamin-prenatal 27-0.8 MG Tabs tablet Take 1 tablet by mouth daily at 12 noon.   sertraline 100 MG tablet Commonly known as: ZOLOFT Take 1 tablet (100 mg total) by mouth daily. Start taking on: October 16, 2023         Discharge home in stable condition Infant Feeding: N/A Infant Disposition:morgue Discharge instruction: per After Visit Summary and Postpartum booklet. Activity: Advance as tolerated. Pelvic rest for 6 weeks.  Diet: routine diet Future Appointments: Future Appointments  Date Time Provider Department Center  10/18/2023  1:55 PM [facility]  11/07/2023  1:15 PM Zina Jerilynn LABOR, MD [facility]  11/11/2023  7:30 AM [facility] PROVIDER 1 [facility]  11/11/2023  8:30 AM [facility] US5 [facility]   Follow up Visit:   Please schedule this patient for a In person postpartum visit in 6 weeks with the following provider: Any provider. Additional Postpartum F/U:Postpartum Depression checkup  Low risk pregnancy complicated by: PPROM w/ IUFD Delivery mode:  Vaginal, Spontaneous Anticipated Birth Control:  Depo  Message sent to [facility] 10/10  10/15/2023 Olam Boards, CNM

## 2023-10-14 NOTE — Progress Notes (Signed)
 Patient ID: Jamileth Putzier, female   DOB: 09-Aug-1995, 28 y.o.   MRN: [id number]  BP 102/66   Pulse 94   Temp 98.8 F (37.1 C) (Oral)   Resp 16   SpO2 99%    Epidural in place, pt requested to confirm FHR. Desires to be able to say goodbye to the baby after delivery.  Pt informed that the ultrasound is considered a limited OB ultrasound and is not intended to be a complete ultrasound exam.  Patient also informed that the ultrasound is not being completed with the intent of assessing for fetal or placental anomalies or any pelvic abnormalities.  Explained that the purpose of today's ultrasound is to assess for  viability.  Patient acknowledges the purpose of the exam and the limitations of the study.    No fetal heart rate detected. Pt given time for immediate grief. Answered questions and gave reassurance that she did not do anything to cause this. Pt verbalized readiness to get things started, would prefer oral cytotec to vaginal. Order changed. MD aware of fetal demise.  Cornell Finder, CNM, MSN, IBCLC Certified Nurse Midwife, [facility]

## 2023-10-14 NOTE — Anesthesia Procedure Notes (Signed)
 Epidural Patient location during procedure: OB Start time: 10/14/2023 1:42 PM End time: 10/14/2023 1:55 PM  Staffing Anesthesiologist: Cleotilde Butler Dade, MD Performed: anesthesiologist   Preanesthetic Checklist Completed: patient identified, IV checked, site marked, risks and benefits discussed, surgical consent, monitors and equipment checked, pre-op evaluation and timeout performed  Epidural Patient position: sitting Prep: ChloraPrep Patient monitoring: heart rate, cardiac monitor, continuous pulse ox and blood pressure Approach: midline Location: L2-L3 Injection technique: LOR saline  Needle:  Needle type: Tuohy  Needle gauge: 17 G Needle length: 9 cm Needle insertion depth: 4 cm Catheter type: closed end flexible Catheter size: 20 Guage Catheter at skin depth: 8 cm Test dose: negative  Assessment Events: blood not aspirated, injection not painful, no injection resistance, no paresthesia and negative IV test  Additional Notes Reason for block:procedure for pain

## 2023-10-14 NOTE — Plan of Care (Signed)

## 2023-10-14 NOTE — Anesthesia Preprocedure Evaluation (Signed)
 Anesthesia Evaluation  Patient identified by MRN, date of birth, ID band Patient awake    Reviewed: Allergy & Precautions, H&P , NPO status , Patient's Chart, lab work & pertinent test results  Airway Mallampati: II  TM Distance: >3 FB Neck ROM: Full    Dental no notable dental hx.    Pulmonary asthma , former smoker   Pulmonary exam normal breath sounds clear to auscultation       Cardiovascular negative cardio ROS Normal cardiovascular exam Rhythm:Regular Rate:Normal     Neuro/Psych Seizures -,    Depression     negative psych ROS   GI/Hepatic negative GI ROS, Neg liver ROS,,,  Endo/Other  negative endocrine ROS    Renal/GU negative Renal ROS  negative genitourinary   Musculoskeletal negative musculoskeletal ROS (+)    Abdominal   Peds negative pediatric ROS (+)  Hematology negative hematology ROS (+)   Anesthesia Other Findings   Reproductive/Obstetrics (+) Pregnancy                              Anesthesia Physical Anesthesia Plan  ASA: 3  Anesthesia Plan: Epidural   Post-op Pain Management:    Induction:   PONV Risk Score and Plan:   Airway Management Planned:   Additional Equipment:   Intra-op Plan:   Post-operative Plan:   Informed Consent: I have reviewed the patients History and Physical, chart, labs and discussed the procedure including the risks, benefits and alternatives for the proposed anesthesia with the patient or authorized representative who has indicated his/her understanding and acceptance.       Plan Discussed with:   Anesthesia Plan Comments:         Anesthesia Quick Evaluation
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
## 2023-10-14 NOTE — Progress Notes (Signed)
 Patient ID: Tracy Foley, female   DOB: [birth year], 28 y.o.   MRN: [id number] Called by RN earlier informing me that patient PPROM while using the bathroom. Patient found lying in bed. Very emotional. Patient denies any cramping pain or vaginal bleeding.   Blood pressure 97/65, pulse 76, temperature 99.2 F (37.3 C), temperature source Oral, resp. rate 16, SpO2 97%. GENERAL: Well-developed, well-nourished female in no acute distress. Visibly upset ABDOMEN: Soft, nontender, nondistended. No organomegaly. PELVIC: Deferred EXTREMITIES: No cyanosis, clubbing, or edema, 2+ distal pulses.  A/P 28 yo P2214 at [redacted]w[redacted]d with incompetent cervix and previable rupture of membrane - Informed patient that given previable rupture of membrane and elevated risk of chorioamnionitis and maternal sepsis, it is advisable to proceed with induction of labor. - Discussed pain management options with patient as well - Patient very emotional and desires to wait before proceeding with next step - Patient will let RN know when ready

## 2023-10-15 ENCOUNTER — Encounter (HOSPITAL_COMMUNITY): Payer: Self-pay | Admitting: Obstetrics and Gynecology

## 2023-10-15 ENCOUNTER — Inpatient Hospital Stay (HOSPITAL_COMMUNITY)
Admission: AD | Admit: 2023-10-15 | Discharge: 2023-10-18 | DRG: 885 | Disposition: A | Discharge status: Home / Self Care | Payer: MEDICAID | Source: Intra-hospital

## 2023-10-15 DIAGNOSIS — Z87891 Personal history of nicotine dependence: Secondary | ICD-10-CM

## 2023-10-15 DIAGNOSIS — F332 Major depressive disorder, recurrent severe without psychotic features: Secondary | ICD-10-CM | POA: Diagnosis present

## 2023-10-15 DIAGNOSIS — Z811 Family history of alcohol abuse and dependence: Secondary | ICD-10-CM

## 2023-10-15 DIAGNOSIS — J45909 Unspecified asthma, uncomplicated: Secondary | ICD-10-CM | POA: Diagnosis present

## 2023-10-15 DIAGNOSIS — Z825 Family history of asthma and other chronic lower respiratory diseases: Secondary | ICD-10-CM | POA: Diagnosis not present

## 2023-10-15 DIAGNOSIS — F4321 Adjustment disorder with depressed mood: Secondary | ICD-10-CM | POA: Diagnosis present

## 2023-10-15 DIAGNOSIS — Z9151 Personal history of suicidal behavior: Secondary | ICD-10-CM

## 2023-10-15 DIAGNOSIS — Z818 Family history of other mental and behavioral disorders: Secondary | ICD-10-CM

## 2023-10-15 DIAGNOSIS — Z79899 Other long term (current) drug therapy: Secondary | ICD-10-CM | POA: Diagnosis not present

## 2023-10-15 DIAGNOSIS — Z823 Family history of stroke: Secondary | ICD-10-CM | POA: Diagnosis not present

## 2023-10-15 LAB — CBC
HCT: 31.8 % — ABNORMAL LOW (ref 36.0–46.0)
Hemoglobin: 10.6 g/dL — ABNORMAL LOW (ref 12.0–15.0)
MCH: 27.8 pg (ref 26.0–34.0)
MCHC: 33.3 g/dL (ref 30.0–36.0)
MCV: 83.5 fL (ref 80.0–100.0)
Platelets: 251 K/uL (ref 150–400)
RBC: 3.81 MIL/uL — ABNORMAL LOW (ref 3.87–5.11)
RDW: 14 % (ref 11.5–15.5)
WBC: 15.9 K/uL — ABNORMAL HIGH (ref 4.0–10.5)
nRBC: 0 % (ref 0.0–0.2)

## 2023-10-15 LAB — HSV 1 ANTIBODY, IGG: HSV 1 Glycoprotein G Ab, IgG: REACTIVE — AB

## 2023-10-15 LAB — RPR: RPR Ser Ql: NONREACTIVE

## 2023-10-15 LAB — HSV 2 ANTIBODY, IGG: HSV 2 Glycoprotein G Ab, IgG: NONREACTIVE

## 2023-10-15 LAB — HEPATITIS B SURFACE ANTIBODY, QUANTITATIVE: Hep B S AB Quant (Post): <3.5 m[IU]/mL — ABNORMAL LOW

## 2023-10-15 MED ORDER — MAGNESIUM HYDROXIDE 400 MG/5ML PO SUSP: 30.0000 mL | Freq: Every day | ORAL | Status: DC | PRN

## 2023-10-15 MED ORDER — BENZOCAINE-MENTHOL 20-0.5 % EX AERO: 1.0000 | INHALATION_SPRAY | CUTANEOUS | Status: AC | PRN

## 2023-10-15 MED ORDER — DIPHENHYDRAMINE HCL 50 MG/ML IJ SOLN: 12.5000 mg | INTRAMUSCULAR | Status: DC | PRN

## 2023-10-15 MED ORDER — SERTRALINE HCL 50 MG PO TABS
50.0000 mg | ORAL_TABLET | Freq: Every day | ORAL | Status: DC
  Administered 2023-10-16 – 2023-10-18 (×3): 50 mg via ORAL
  Filled 2023-10-15 (×3): qty 1

## 2023-10-15 MED ORDER — PRENATAL MULTIVITAMIN CH: 1.0000 | ORAL_TABLET | Freq: Every day | ORAL | Status: DC

## 2023-10-15 MED ORDER — IBUPROFEN 600 MG PO TABS: 600.0000 mg | ORAL_TABLET | Freq: Four times a day (QID) | ORAL | Status: AC | PRN

## 2023-10-15 MED ORDER — DOCUSATE SODIUM 100 MG PO CAPS
100.0000 mg | ORAL_CAPSULE | Freq: Every day | ORAL | Status: DC
  Administered 2023-10-16 – 2023-10-18 (×2): 100 mg via ORAL
  Filled 2023-10-15 (×3): qty 1

## 2023-10-15 MED ORDER — TETANUS-DIPHTH-ACELL PERTUSSIS 5-2-15.5 LF-MCG/0.5 IM SUSP
0.5000 mL | Freq: Once | INTRAMUSCULAR | Status: DC
  Filled 2023-10-15: qty 0.5

## 2023-10-15 MED ORDER — TETANUS-DIPHTH-ACELL PERTUSSIS 5-2-15.5 LF-MCG/0.5 IM SUSP
0.5000 mL | Freq: Once | INTRAMUSCULAR | Status: DC
Start: 2023-10-16 — End: 2023-10-15

## 2023-10-15 MED ORDER — ACETAMINOPHEN 325 MG PO TABS: 650.0000 mg | ORAL_TABLET | ORAL | Status: DC | PRN

## 2023-10-15 MED ORDER — IBUPROFEN 600 MG PO TABS
600.0000 mg | ORAL_TABLET | Freq: Four times a day (QID) | ORAL | Status: DC
  Administered 2023-10-15: 600 mg via ORAL
  Filled 2023-10-15 (×2): qty 1

## 2023-10-15 MED ORDER — BENZOCAINE-MENTHOL 20-0.5 % EX AERO: 1.0000 | INHALATION_SPRAY | CUTANEOUS | Status: DC | PRN

## 2023-10-15 MED ORDER — ONDANSETRON HCL 4 MG PO TABS: 4.0000 mg | ORAL_TABLET | ORAL | Status: DC | PRN

## 2023-10-15 MED ORDER — METHYLERGONOVINE MALEATE 0.2 MG PO TABS
0.2000 mg | ORAL_TABLET | Freq: Four times a day (QID) | ORAL | Status: AC
  Filled 2023-10-15 (×2): qty 1

## 2023-10-15 MED ORDER — SENNOSIDES-DOCUSATE SODIUM 8.6-50 MG PO TABS: 2.0000 | ORAL_TABLET | Freq: Every day | ORAL | Status: DC

## 2023-10-15 MED ORDER — COCONUT OIL OIL: 1.0000 | TOPICAL_OIL | Status: DC | PRN

## 2023-10-15 MED ORDER — OXYCODONE-ACETAMINOPHEN 5-325 MG PO TABS: 1.0000 | ORAL_TABLET | ORAL | Status: DC | PRN

## 2023-10-15 MED ORDER — PRENATAL MULTIVITAMIN CH
1.0000 | ORAL_TABLET | Freq: Every day | ORAL | Status: DC
  Administered 2023-10-17: 1 via ORAL
  Filled 2023-10-15 (×2): qty 1

## 2023-10-15 MED ORDER — WITCH HAZEL-GLYCERIN EX PADS: 1.0000 | MEDICATED_PAD | CUTANEOUS | Status: DC | PRN

## 2023-10-15 MED ORDER — DIPHENHYDRAMINE HCL 25 MG PO CAPS: 25.0000 mg | ORAL_CAPSULE | Freq: Four times a day (QID) | ORAL | Status: DC | PRN

## 2023-10-15 MED ORDER — SIMETHICONE 80 MG PO CHEW: 80.0000 mg | CHEWABLE_TABLET | ORAL | Status: DC | PRN

## 2023-10-15 MED ORDER — CALCIUM CARBONATE ANTACID 500 MG PO CHEW: 2.0000 | CHEWABLE_TABLET | ORAL | Status: DC | PRN

## 2023-10-15 MED ORDER — METHYLERGONOVINE MALEATE 0.2 MG PO TABS
0.2000 mg | ORAL_TABLET | Freq: Four times a day (QID) | ORAL | Status: DC
  Administered 2023-10-15 (×2): 0.2 mg via ORAL
  Filled 2023-10-15 (×2): qty 1

## 2023-10-15 MED ORDER — DIBUCAINE (PERIANAL) 1 % EX OINT: 1.0000 | TOPICAL_OINTMENT | CUTANEOUS | Status: DC | PRN

## 2023-10-15 MED ORDER — ONDANSETRON HCL 4 MG/2ML IJ SOLN: 4.0000 mg | INTRAMUSCULAR | Status: DC | PRN

## 2023-10-15 MED ORDER — CEFADROXIL 500 MG PO CAPS: 500.0000 mg | ORAL_CAPSULE | Freq: Two times a day (BID) | ORAL | Status: AC

## 2023-10-15 MED ORDER — HYDROXYZINE HCL 10 MG PO TABS: 10.0000 mg | ORAL_TABLET | Freq: Four times a day (QID) | ORAL | Status: DC | PRN

## 2023-10-15 MED ORDER — SERTRALINE HCL 100 MG PO TABS: 100.0000 mg | ORAL_TABLET | Freq: Every day | ORAL | Status: DC

## 2023-10-15 MED ORDER — IBUPROFEN 600 MG PO TABS
600.0000 mg | ORAL_TABLET | Freq: Four times a day (QID) | ORAL | Status: DC
  Administered 2023-10-16 – 2023-10-17 (×3): 600 mg via ORAL
  Filled 2023-10-15 (×3): qty 1

## 2023-10-15 MED ORDER — SENNOSIDES-DOCUSATE SODIUM 8.6-50 MG PO TABS
2.0000 | ORAL_TABLET | Freq: Every day | ORAL | Status: DC
  Administered 2023-10-16 – 2023-10-18 (×2): 2 via ORAL
  Filled 2023-10-15 (×3): qty 2

## 2023-10-15 MED ORDER — OLANZAPINE 10 MG IM SOLR: 5.0000 mg | Freq: Three times a day (TID) | INTRAMUSCULAR | Status: DC | PRN

## 2023-10-15 MED ORDER — LEVETIRACETAM 500 MG PO TABS
500.0000 mg | ORAL_TABLET | Freq: Two times a day (BID) | ORAL | Status: DC
  Administered 2023-10-16 – 2023-10-18 (×5): 500 mg via ORAL
  Filled 2023-10-15 (×5): qty 1

## 2023-10-15 MED ORDER — ZOLPIDEM TARTRATE 5 MG PO TABS: 5.0000 mg | ORAL_TABLET | Freq: Every evening | ORAL | Status: DC | PRN

## 2023-10-15 NOTE — Plan of Care (Signed)
 Problem: Education: Goal: Knowledge of disease or condition will improve Outcome: Completed/Met Goal: Knowledge of the prescribed therapeutic regimen will improve Outcome: Completed/Met Goal: Individualized Educational Video(s) Outcome: Completed/Met   Problem: Clinical Measurements: Goal: Complications related to the disease process, condition or treatment will be avoided or minimized Outcome: Completed/Met   Problem: Education: Goal: Knowledge of General Education information will improve Description: Including pain rating scale, medication(s)/side effects and non-pharmacologic comfort measures 10/15/2023 1856 by Knute Suzen RAMAN, RN Outcome: Completed/Met 10/15/2023 0831 by Knute Suzen RAMAN, RN Outcome: Progressing   Problem: Health Behavior/Discharge Planning: Goal: Ability to manage health-related needs will improve 10/15/2023 1856 by Knute Suzen RAMAN, RN Outcome: Completed/Met 10/15/2023 0831 by Knute Suzen RAMAN, RN Outcome: Progressing   Problem: Clinical Measurements: Goal: Ability to maintain clinical measurements within normal limits will improve 10/15/2023 1856 by Knute Suzen RAMAN, RN Outcome: Completed/Met 10/15/2023 0831 by Knute Suzen RAMAN, RN Outcome: Progressing Goal: Will remain free from infection 10/15/2023 1856 by Knute Suzen RAMAN, RN Outcome: Completed/Met 10/15/2023 0831 by Knute Suzen RAMAN, RN Outcome: Progressing Goal: Diagnostic test results will improve 10/15/2023 1856 by Knute Suzen RAMAN, RN Outcome: Completed/Met 10/15/2023 0831 by Knute Suzen RAMAN, RN Outcome: Progressing Goal: Respiratory complications will improve 10/15/2023 1856 by Knute Suzen RAMAN, RN Outcome: Completed/Met 10/15/2023 0831 by Knute Suzen RAMAN, RN Outcome: Progressing Goal: Cardiovascular complication will be avoided 10/15/2023 1856 by Knute Suzen RAMAN, RN Outcome: Completed/Met 10/15/2023 0831 by Knute Suzen RAMAN, RN Outcome:  Progressing   Problem: Activity: Goal: Risk for activity intolerance will decrease 10/15/2023 1856 by Knute Suzen RAMAN, RN Outcome: Completed/Met 10/15/2023 0831 by Knute Suzen RAMAN, RN Outcome: Progressing   Problem: Nutrition: Goal: Adequate nutrition will be maintained 10/15/2023 1856 by Knute Suzen RAMAN, RN Outcome: Completed/Met 10/15/2023 0831 by Knute Suzen RAMAN, RN Outcome: Progressing   Problem: Coping: Goal: Level of anxiety will decrease 10/15/2023 1856 by Knute Suzen RAMAN, RN Outcome: Completed/Met 10/15/2023 0831 by Knute Suzen RAMAN, RN Outcome: Progressing   Problem: Elimination: Goal: Will not experience complications related to bowel motility Outcome: Completed/Met Goal: Will not experience complications related to urinary retention Outcome: Completed/Met   Problem: Pain Managment: Goal: General experience of comfort will improve and/or be controlled Outcome: Completed/Met   Problem: Safety: Goal: Ability to remain free from injury will improve 10/15/2023 1856 by Knute Suzen RAMAN, RN Outcome: Completed/Met 10/15/2023 0831 by Knute Suzen RAMAN, RN Outcome: Progressing   Problem: Skin Integrity: Goal: Risk for impaired skin integrity will decrease Outcome: Completed/Met   Problem: Education: Goal: Knowledge of Childbirth will improve Outcome: Completed/Met Goal: Ability to make informed decisions regarding treatment and plan of care will improve Outcome: Completed/Met Goal: Ability to state and carry out methods to decrease the pain will improve Outcome: Completed/Met Goal: Individualized Educational Video(s) Outcome: Completed/Met   Problem: Coping: Goal: Ability to verbalize concerns and feelings about labor and delivery will improve Outcome: Completed/Met   Problem: Life Cycle: Goal: Ability to make normal progression through stages of labor will improve Outcome: Completed/Met Goal: Ability to effectively push during vaginal  delivery will improve Outcome: Completed/Met   Problem: Role Relationship: Goal: Will demonstrate positive interactions with the child Outcome: Completed/Met   Problem: Safety: Goal: Risk of complications during labor and delivery will decrease Outcome: Completed/Met   Problem: Pain Management: Goal: Relief or control of pain from uterine contractions will improve Outcome: Completed/Met   Problem: Education: Goal: Knowledge of condition will improve 10/15/2023 1856 by Knute Suzen RAMAN, RN Outcome: Completed/Met 10/15/2023 0831 by  Knute Suzen RAMAN, RN Outcome: Progressing Goal: Individualized Educational Video(s) 10/15/2023 1856 by Knute Suzen RAMAN, RN Outcome: Completed/Met 10/15/2023 0831 by Knute Suzen RAMAN, RN Outcome: Progressing Goal: Individualized Newborn Educational Video(s) 10/15/2023 1856 by Knute Suzen RAMAN, RN Outcome: Completed/Met 10/15/2023 0831 by Knute Suzen RAMAN, RN Outcome: Progressing   Problem: Activity: Goal: Will verbalize the importance of balancing activity with adequate rest periods 10/15/2023 1856 by Knute Suzen RAMAN, RN Outcome: Completed/Met 10/15/2023 0831 by Knute Suzen RAMAN, RN Outcome: Progressing Goal: Ability to tolerate increased activity will improve 10/15/2023 1856 by Knute Suzen RAMAN, RN Outcome: Completed/Met 10/15/2023 0831 by Knute Suzen RAMAN, RN Outcome: Progressing   Problem: Coping: Goal: Ability to identify and utilize available resources and services will improve 10/15/2023 1856 by Knute Suzen RAMAN, RN Outcome: Completed/Met 10/15/2023 0831 by Knute Suzen RAMAN, RN Outcome: Progressing   Problem: Life Cycle: Goal: Chance of risk for complications during the postpartum period will decrease 10/15/2023 1856 by Knute Suzen RAMAN, RN Outcome: Completed/Met 10/15/2023 0831 by Knute Suzen RAMAN, RN Outcome: Progressing   Problem: Role Relationship: Goal: Ability to demonstrate positive  interaction with newborn will improve 10/15/2023 1856 by Knute Suzen RAMAN, RN Outcome: Completed/Met 10/15/2023 0831 by Knute Suzen RAMAN, RN Outcome: Progressing   Problem: Skin Integrity: Goal: Demonstration of wound healing without infection will improve 10/15/2023 1856 by Knute Suzen RAMAN, RN Outcome: Completed/Met 10/15/2023 0831 by Knute Suzen RAMAN, RN Outcome: Progressing

## 2023-10-15 NOTE — Progress Notes (Signed)
 CSW acknowledges social work consult placed due to suicidal ideation. Psychiatry consult pending. Per bedside RN, patient is resting in room with safety sitter present. CSW to meet with patient once psych evaluation is complete.  Signed,  Sharyne LOIS Roulette, MSW, LCSWA, LCASA 10/15/2023 8:46 AM

## 2023-10-15 NOTE — Progress Notes (Signed)
 Patient has left unit to go to receiving facility in stable condition. Anyanwu MD also present at patient's discharge and spoke to receiving facilities [facility] personally and explained that patient is appropriate for admission to there facility and also witnessed that patient was in stable condition for transport as well.

## 2023-10-15 NOTE — Plan of Care (Signed)
  Problem: Education: Goal: Knowledge of General Education information will improve Description: Including pain rating scale, medication(s)/side effects and non-pharmacologic comfort measures Outcome: Progressing   Problem: Health Behavior/Discharge Planning: Goal: Ability to manage health-related needs will improve Outcome: Progressing   Problem: Clinical Measurements: Goal: Ability to maintain clinical measurements within normal limits will improve Outcome: Progressing Goal: Will remain free from infection Outcome: Progressing Goal: Diagnostic test results will improve Outcome: Progressing Goal: Respiratory complications will improve Outcome: Progressing Goal: Cardiovascular complication will be avoided Outcome: Progressing   Problem: Activity: Goal: Risk for activity intolerance will decrease Outcome: Progressing   Problem: Nutrition: Goal: Adequate nutrition will be maintained Outcome: Progressing   Problem: Coping: Goal: Level of anxiety will decrease Outcome: Progressing   Problem: Safety: Goal: Ability to remain free from injury will improve Outcome: Progressing   Problem: Education: Goal: Knowledge of condition will improve Outcome: Progressing Goal: Individualized Educational Video(s) Outcome: Progressing Goal: Individualized Newborn Educational Video(s) Outcome: Progressing   Problem: Activity: Goal: Will verbalize the importance of balancing activity with adequate rest periods Outcome: Progressing Goal: Ability to tolerate increased activity will improve Outcome: Progressing   Problem: Coping: Goal: Ability to identify and utilize available resources and services will improve Outcome: Progressing   Problem: Life Cycle: Goal: Chance of risk for complications during the postpartum period will decrease Outcome: Progressing   Problem: Role Relationship: Goal: Ability to demonstrate positive interaction with newborn will improve Outcome:  Progressing   Problem: Skin Integrity: Goal: Demonstration of wound healing without infection will improve Outcome: Progressing
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
## 2023-10-15 NOTE — Progress Notes (Signed)
 Patient refused to let this nurse assess her fundus and her bleeding. Patient educated on the importance of this nurse needing to assess both things, but patient still refused to allow this nurse to assess both things. This nurse also asked patient if she thought she  could go to the bathroom with this nurse to provide a urine sample so that the providers could determine wether or not the patient had a UTI due to new onset development of burning with urination, patient also refused to provide a urine specimen. This nurse will respect patients wishes, but will also attempt to assess fundus and bleeding at another time.

## 2023-10-15 NOTE — Plan of Care (Signed)
  Problem: Education: Goal: Knowledge of disease or condition will improve Outcome: Progressing Goal: Knowledge of the prescribed therapeutic regimen will improve Outcome: Progressing Goal: Individualized Educational Video(s) Outcome: Progressing   Problem: Clinical Measurements: Goal: Complications related to the disease process, condition or treatment will be avoided or minimized Outcome: Progressing   Problem: Education: Goal: Knowledge of General Education information will improve Description: Including pain rating scale, medication(s)/side effects and non-pharmacologic comfort measures Outcome: Progressing   Problem: Health Behavior/Discharge Planning: Goal: Ability to manage health-related needs will improve Outcome: Progressing   Problem: Clinical Measurements: Goal: Ability to maintain clinical measurements within normal limits will improve Outcome: Progressing Goal: Will remain free from infection Outcome: Progressing Goal: Diagnostic test results will improve Outcome: Progressing Goal: Respiratory complications will improve Outcome: Progressing Goal: Cardiovascular complication will be avoided Outcome: Progressing   Problem: Activity: Goal: Risk for activity intolerance will decrease Outcome: Progressing   Problem: Nutrition: Goal: Adequate nutrition will be maintained Outcome: Progressing   Problem: Coping: Goal: Level of anxiety will decrease Outcome: Progressing   Problem: Elimination: Goal: Will not experience complications related to bowel motility Outcome: Progressing Goal: Will not experience complications related to urinary retention Outcome: Progressing   Problem: Pain Managment: Goal: General experience of comfort will improve and/or be controlled Outcome: Progressing   Problem: Safety: Goal: Ability to remain free from injury will improve Outcome: Progressing   Problem: Skin Integrity: Goal: Risk for impaired skin integrity will  decrease Outcome: Progressing   Problem: Education: Goal: Knowledge of Childbirth will improve Outcome: Progressing Goal: Ability to make informed decisions regarding treatment and plan of care will improve Outcome: Progressing Goal: Ability to state and carry out methods to decrease the pain will improve Outcome: Progressing Goal: Individualized Educational Video(s) Outcome: Progressing   Problem: Coping: Goal: Ability to verbalize concerns and feelings about labor and delivery will improve Outcome: Progressing   Problem: Life Cycle: Goal: Ability to make normal progression through stages of labor will improve Outcome: Progressing Goal: Ability to effectively push during vaginal delivery will improve Outcome: Progressing   Problem: Role Relationship: Goal: Will demonstrate positive interactions with the child Outcome: Progressing   Problem: Safety: Goal: Risk of complications during labor and delivery will decrease Outcome: Progressing   Problem: Pain Management: Goal: Relief or control of pain from uterine contractions will improve Outcome: Progressing   Problem: Education: Goal: Knowledge of condition will improve Outcome: Progressing Goal: Individualized Educational Video(s) Outcome: Progressing Goal: Individualized Newborn Educational Video(s) Outcome: Progressing   Problem: Activity: Goal: Will verbalize the importance of balancing activity with adequate rest periods Outcome: Progressing Goal: Ability to tolerate increased activity will improve Outcome: Progressing   Problem: Coping: Goal: Ability to identify and utilize available resources and services will improve Outcome: Progressing   Problem: Life Cycle: Goal: Chance of risk for complications during the postpartum period will decrease Outcome: Progressing   Problem: Role Relationship: Goal: Ability to demonstrate positive interaction with newborn will improve Outcome: Progressing   Problem: Skin  Integrity: Goal: Demonstration of wound healing without infection will improve Outcome: Progressing

## 2023-10-15 NOTE — Progress Notes (Signed)
 Post Partum Day 1 Subjective: no complaints, up ad lib, voiding, and tolerating PO  Objective: Blood pressure 115/74, pulse 99, temperature 97.9 F (36.6 C), temperature source Oral, resp. rate 15, height 4' 11 (1.499 m), weight 56 kg, SpO2 99%.  Physical Exam:  General: alert, cooperative, and no distress Lochia: appropriate Uterine Fundus: firm Incision: n/a DVT Evaluation: No evidence of DVT seen on physical exam.  Recent Labs    10/14/23 1414 10/15/23 0353  HGB 11.2* 10.6*  HCT 33.4* 31.8*    Assessment/Plan: Psychiatric evaluation with recommendation for inpatient admission Pt does not want inpatient admission. Now denies concerns about self harm but these were reported prior to and at the time of delivery of fetal demise.  CNM discussed with behavioral health NP and pt with no support, hx suicide attempt with intubation 02/2023, and suicidal comments/thoughts prior to delivery, inpatient admission is felt to be best course for the patient.  Discussed voluntary admission vs IVC with patient, who signed to go to behavioral health voluntarily.  Encouraged pt follow up outpatient and message sent to integrated behavioral health at [facility].  No barriers to d/c from [facility] standpoint D/C placed for transfer to [facility]   LOS: 1 day   Olam Boards, CNM 10/15/2023, 7:08 PM

## 2023-10-15 NOTE — Progress Notes (Signed)
 Upon assessment patient asking when she can go home.  States she is not suicidal.  Edinburgh Postnatel Depression score is #9.  She relayed to this RN that the feelings she had was due to the situation she was dealing with @ that moment in time.

## 2023-10-15 NOTE — Progress Notes (Signed)
 Patient arrived to this unit via wheelchair and in stable conditions, patient's infant was also transported with her and was placed in cuddle Cot once arriving to room. Patient has a very flat affect, is very withdrawn and not answering majority of this nurse's questions. Patient did endorse however that she did not have any thoughts or plans to kill herself or anyone else when this nurse asked her about that subject.Patient did however state that she just wanted to be left alone and she didn't want no one sitting with her and watching her. Patient also stated that she did not know why she had some strange person following her around and watching her in her room this educated the patient on why she needed a Recruitment consultant and emphasized very strongly that it is  for her safety, not because she has done something wrong. Patient stated she understood and closed her eyes to go to sleep. This nurse will continue to monitor patient closely.

## 2023-10-15 NOTE — Progress Notes (Signed)
 This NT was asked by Maurine Bliss, RN to sit with the patient until the doctor could assess if the patient needed SI orders put in. I sat with the patient from 8:30am until 10:30am and at 10:30am the RN told me I wasn't needed to stay with the patient any longer.   I stayed at the bedside with the patient for the duration of the 2 hours and the patient was cooperative and spent the time talking with her aunt and uncle who were present in the room with her.

## 2023-10-15 NOTE — Anesthesia Postprocedure Evaluation (Signed)
 Anesthesia Post Note  Patient: Tracy Foley  Procedure(s) Performed: AN AD HOC LABOR EPIDURAL     Patient location during evaluation: Mother Baby Anesthesia Type: Epidural Level of consciousness: awake and alert and oriented Pain management: satisfactory to patient Vital Signs Assessment: post-procedure vital signs reviewed and stable Respiratory status: respiratory function stable Cardiovascular status: stable Postop Assessment: no headache, no backache, epidural receding, patient able to bend at knees, no signs of nausea or vomiting, adequate PO intake and able to ambulate Anesthetic complications: no   No notable events documented.  Last Vitals:  Vitals:   10/15/23 0807 10/15/23 1224  BP:  100/63  Pulse:  83  Resp: 13 14  Temp: 36.8 C 36.8 C  SpO2: 98% 95%    Last Pain:  Vitals:   10/15/23 1229  TempSrc:   PainSc: 7    Pain Goal:                   Tracy Foley

## 2023-10-15 NOTE — Progress Notes (Addendum)
 CSW received update that patient is recommended for inpatient psychiatric admission and has been accepted to [facility].   CSW met with patient at bedside to discuss care plan per psychiatry recommendation. When CSW entered room, patient was observed laying in bed holding a stuffed animal. Patient was staring across the room. Safety sitter was present near bedside. CSW introduced self, expressed condolences, and explained reason for visit. Safety sitter left room during social work consult per patient request and returned to sit with patient after this CSW exited patient room. Patient was oriented x4 and presented with a flat affect. Patient became irritable during encounter but remained cooperative.  CSW explained care team's recommendation for inpatient psychiatric placement. Patient expressed a desire to go home and be with her aunt, stating that she wants to grieve at home with her family. Patient stated she felt that she was being judged by her past. CSW provided active listening and validated patient's feelings. CSW explained that the top priority of her care team's decisions are to keep her safe and ensure that she receives follow up treatment to support her mental health. CSW explained voluntary vs involuntary commitment process. Patient was agreeable to sign voluntary admission paperwork. Signed Voluntary Admission and Consent for Treatment Form faxed to [facility]. Original form given to bedside RN for patient transport and a copy was placed in patient's chart. CSW inquired if patient would like to discuss plans for her belongings at transfer. Patient declined.   Per bedside RN, patient has chosen [employer] for cremation of fetus. Per [facility], fetus can be stored in morgue until patient or patient's family is able to make arrangements for funeral service pick up.  Signed,  Sharyne LOIS Roulette, MSW, LCSWA, LCASA 10/15/2023 5:39  PM

## 2023-10-15 NOTE — Progress Notes (Signed)
 [facility] RN concerned about medical clearance of patient based upon information received in report regarding active bleeding and management of patient.  Writer spoke with Dr Herchel concerning status of patient.  She reports patient's bleeding post miscarriage is normal and only needs to be maintained with pads. She reports no excess bleeding observed.  Ene, NP for [facility] was also present on the call.

## 2023-10-16 ENCOUNTER — Other Ambulatory Visit: Payer: Self-pay

## 2023-10-16 ENCOUNTER — Encounter (HOSPITAL_COMMUNITY): Payer: Self-pay | Admitting: Psychiatry

## 2023-10-16 DIAGNOSIS — F332 Major depressive disorder, recurrent severe without psychotic features: Principal | ICD-10-CM

## 2023-10-16 DIAGNOSIS — F4321 Adjustment disorder with depressed mood: Secondary | ICD-10-CM

## 2023-10-16 LAB — CMV ANTIBODY, IGG (EIA): CMV Ab - IgG: >10 U/mL — ABNORMAL HIGH (ref 0.00–0.59)

## 2023-10-16 LAB — RUBELLA SCREEN: Rubella: 1.34 {index} (ref >0.99)

## 2023-10-16 LAB — TOXOPLASMA GONDII ANTIBODY, IGG: Toxoplasma IgG Ratio: <3 [IU]/mL (ref 0.0–7.1)

## 2023-10-16 MED ORDER — TETANUS-DIPHTH-ACELL PERTUSSIS 5-2-15.5 LF-MCG/0.5 IM SUSP
0.5000 mL | Freq: Once | INTRAMUSCULAR | Status: AC
  Administered 2023-10-16: 0.5 mL via INTRAMUSCULAR
  Filled 2023-10-16: qty 0.5

## 2023-10-16 MED ORDER — CEFADROXIL 500 MG PO CAPS
500.0000 mg | ORAL_CAPSULE | Freq: Two times a day (BID) | ORAL | Status: DC
  Administered 2023-10-16 – 2023-10-18 (×5): 500 mg via ORAL
  Filled 2023-10-16 (×10): qty 1

## 2023-10-16 NOTE — Progress Notes (Signed)
 Patient signed a 72 hr request for discharge at 1304 today

## 2023-10-16 NOTE — [facility] Suicide Risk Assessment (Signed)
 [facility] Admission Suicide Risk Assessment   Nursing information obtained from:  Patient Demographic factors:  Adolescent or young adult Current Mental Status:  NA Loss Factors:  Loss of significant relationship Historical Factors:  Prior suicide attempts, Impulsivity Risk Reduction Factors:  Responsible for children under 29 years of age, Living with another person, especially a relative  Total Time spent with patient: 45 minutes Principal Problem: Major depressive disorder, recurrent severe without psychotic features (HCC) Diagnosis:  Principal Problem:   Major depressive disorder, recurrent severe without psychotic features (HCC) Active Problems:   Grief  Subjective Data: 28 YO F s/p loss of 17 week pregnancy admitted for making suicidal statements following the death of her fetus and about 7 months after previous suicide attempt.   On interview, the patient is cooperative, but minimizing. She emphasizes that she is only here because her doctor wanted her to be, not because she needs to be. After some back and forth she admitted to I said that if the baby don't make it, I don't want to live but minimizes the gravity of this statement. She says that she wants to be at home with familiar people, and does not want to be around people she doesn't know. She does not feel that she is depressed. She is sleeping okay and her appetite is normal for her, which is only about 1 meal per day. While she says that she is not suicidal, she later states that the only reason she is still here is because she has children. She denies hallucinations, paranoia and delusions. No homicidal ideation. Denies anhedonia, guilt.   Continued Clinical Symptoms:  Alcohol Use Disorder Identification Test Final Score (AUDIT): 0 The Alcohol Use Disorders Identification Test, Guidelines for Use in Primary Care, Second Edition.  World Science writer [facility]). Score between 0-7:  no or low risk or alcohol related  problems. Score between 8-15:  moderate risk of alcohol related problems. Score between 16-19:  high risk of alcohol related problems. Score 20 or above:  warrants further diagnostic evaluation for alcohol dependence and treatment.   CLINICAL FACTORS:   Depression:   Severe Epilepsy Medical Diagnoses and Treatments/Surgeries   Musculoskeletal: Strength & Muscle Tone: within normal limits Gait & Station: normal Patient leans: N/A  Psychiatric Specialty Exam:  Presentation  General Appearance: Casual; Appropriate for Environment  Eye Contact:Good  Speech:Normal Rate  Speech Volume:Normal  Handedness:No data recorded  Mood and Affect  Mood:Depressed  Affect:Flat   Thought Process  Thought Processes:Goal Directed  Descriptions of Associations:Intact  Orientation:Full (Time, Place and Person)  Thought Content:Logical  History of Schizophrenia/Schizoaffective disorder:No data recorded Duration of Psychotic Symptoms:No data recorded Hallucinations:Hallucinations: None  Ideas of Reference:None  Suicidal Thoughts:Suicidal Thoughts: -- (denies)  Homicidal Thoughts:Homicidal Thoughts: No   Sensorium  Memory:Immediate Good; Recent Good; Remote Fair  Judgment:Poor  Insight:Poor   Executive Functions  Concentration:Fair  Attention Span:Fair  Recall:Good  Fund of Knowledge:Good  Language:Good   Psychomotor Activity  Psychomotor Activity:Psychomotor Activity: Normal   Assets  Assets:Communication Skills; Housing   Sleep  Sleep:Sleep: Good    Physical Exam: Physical Exam Vitals and nursing note reviewed.  Constitutional:      Appearance: Normal appearance.  HENT:     Head: Normocephalic and atraumatic.  Eyes:     Extraocular Movements: Extraocular movements intact.  Pulmonary:     Effort: Pulmonary effort is normal.  Musculoskeletal:        General: Normal range of motion.     Cervical back: Normal range  of motion.  Neurological:      General: No focal deficit present.     Mental Status: She is alert and oriented to person, place, and time.  Psychiatric:        Behavior: Behavior normal.    Review of Systems  Constitutional:  Positive for malaise/fatigue. Negative for chills and fever.  Respiratory:  Negative for cough and shortness of breath.   Cardiovascular:  Negative for chest pain.  Gastrointestinal:  Negative for constipation, diarrhea, nausea and vomiting.  Genitourinary:  Negative for dysuria.       Vaginal bleeding  Musculoskeletal:  Negative for joint pain and myalgias.  Skin:  Negative for rash.  Neurological:  Negative for tremors.  Psychiatric/Behavioral:  Negative for hallucinations. The patient does not have insomnia.    Blood pressure 108/84, pulse 99, temperature 98.3 F (36.8 C), temperature source Oral, resp. rate 16, height 4' 11 (1.499 m), weight 55.3 kg, SpO2 98%. Body mass index is 24.64 kg/m.   COGNITIVE FEATURES THAT CONTRIBUTE TO RISK:  Closed-mindedness and Thought constriction (tunnel vision)    SUICIDE RISK:   Moderate:  Frequent suicidal ideation with limited intensity, and duration, some specificity in terms of plans, no associated intent, good self-control, limited dysphoria/symptomatology, some risk factors present, and identifiable protective factors, including available and accessible social support.  PLAN OF CARE:  Long Term Goals: Improvement in symptoms so as ready for discharge   Short Term Goals: Patient will verbalize feelings in meetings with treatment team members., Patient will attend at least of 50% of the groups daily., Pt will complete the PHQ9 on admission, day 3 and discharge., and Patient will take medications as prescribed daily.  Medications: Mood/anxiety: continue group therapy, milieu therapy, 1:1 evaluation with provider.  Medication management: started zoloft 50mg  PO daily for mood and anxiety  Medical: PRNs for pain, constipation, indigestion  available.  Labs/studies: nutrition panel: patients with substance use and/or mental health disorders frequently have poor PO intake. Additionally, vitamin deficiencies are known to have symptoms such as lethargy, confusion, fatigue, restlessness and sleep disturbance, which can exacerbate mental illness.  EKG check to monitor for changes to cardiac conduction, which can happen as a known side effect to some psychiatric medications Continue keppra  for seizures Safety and Monitoring: voluntarily admission to [facility] inpatient unit unit for safety, stabilization and treatment Daily contact with patient to assess and evaluate symptoms and progress in treatment Patient's case to be discussed in multi-disciplinary team meeting Observation Level : q15 minute checks Vital signs: q12 hours Precautions: Routine and Suicide  Based on my evaluation the patient does not appear to have an emergency medical condition.   I certify that inpatient services furnished can reasonably be expected to improve the patient's condition.   Corean Anette Potters, MD 10/16/2023, 1:46 PM

## 2023-10-16 NOTE — Plan of Care (Signed)
   Problem: Education: Goal: Knowledge of [place] [facility] Education information/materials will improve Outcome: Progressing Goal: Verbalization of understanding the information provided will improve Outcome: Progressing

## 2023-10-16 NOTE — Progress Notes (Addendum)
 D. Pt observed resting in bed upon initial approach this morning. Pt presented with flat affect, denied pain and SI- denied excessive bleeding, reported only soiling one pad since last night. Reminded pt to report to MHT or RN when she's changing pads, as staff needs to be aware of the extent of her bleeding. Pt acknowledged and agreed to this. Pt voiced no complaints other than feeling tired. A. Labs and vitals monitored. Pt given and educated on medications. Pt supported emotionally and encouraged to express concerns and ask questions.   R. Pt remains safe with 15 minute checks. Will continue POC.

## 2023-10-16 NOTE — Plan of Care (Signed)
   Problem: Education: Goal: Knowledge of [place] [facility] Education information/materials will improve Outcome: Progressing Goal: Emotional status will improve Outcome: Progressing Goal: Mental status will improve Outcome: Progressing Goal: Verbalization of understanding the information provided will improve Outcome: Progressing

## 2023-10-16 NOTE — [facility] Group Notes (Signed)
 Adult Psychoeducational Group Note   Date:  10/16/2023 Time:  10:19 AM   Group Topic/Focus:  Activity:  Thumball activities for groups involve tossing a ball with questions or prompts, where the person who catches it responds to the panel under their thumb. This can be used for various purposes, such as icebreakers, team-building, and even physical warm-ups. Specific activities include collaborative exercises, guided discussions, and even movement-based games.    Participation Level:  Did Not Attend   Additional Comments:  Pt was invited but did not attend group for thumbball activity.    Niel CHRISTELLA Nightingale 10/16/2023, 10:19 AM

## 2023-10-16 NOTE — Group Note (Signed)
 Date:  10/16/2023 Time:  8:55 PM  Group Topic/Focus:  Wrap-Up Group:   The focus of this group is to help patients review their daily goal of treatment and discuss progress on daily workbooks.    Participation Level:  Did Not Attend  Participation Quality:  none  Affect:  none  Cognitive:  none  Insight: None  Engagement in Group:  None  Modes of Intervention:  none  Additional Comments:   Pt was encouraged but refused to attend wrap up group  Crisol Muecke A Vandora Jaskulski 10/16/2023, 8:55 PM
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
## 2023-10-16 NOTE — [facility] Group Notes (Signed)
 Adult Psychoeducational Group Note  Date:  10/16/2023 Time:  10:13 AM  Group Topic/Focus:  Goals Group:   The focus of this group is to help patients establish daily goals to achieve during treatment and discuss how the patient can incorporate goal setting into their daily lives to aide in recovery.  Participation Level:  Did Not Attend  Additional Comments:  Pt was invited but did not attend orientation/goals group.  Niel CHRISTELLA Nightingale 10/16/2023, 10:13 AM
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
## 2023-10-16 NOTE — B[facility] Counselor (Signed)
 Adult Comprehensive Assessment  Patient ID: Shalonda Sachse, female   DOB: [birth year], 28 y.o.   MRN: [id number]  Information Source: Information source: Patient  Current Stressors:  Patient states their primary concerns and needs for treatment are:: They wanted me here I guess Patient states their goals for this hospitilization and ongoing recovery are:: No goals Educational / Learning stressors: None reported Employment / Job issues: None reported Family Relationships: None reported Surveyor, quantity / Lack of resources (include bankruptcy): None reported Housing / Lack of housing: None reported Physical health (include injuries & life threatening diseases): None reported Social relationships: None reported Substance abuse: None reported Bereavement / Loss: None reported  Living/Environment/Situation:  Living Arrangements: Parent Living conditions (as described by patient or guardian): They're good Who else lives in the home?: Patient's mother and son How long has patient lived in current situation?: A couple months What is atmosphere in current home: Loving, Supportive, Temporary, Comfortable  Family History:  Marital status: Single Are you sexually active?: No What is your sexual orientation?: heterosexual Has your sexual activity been affected by drugs, alcohol, medication, or emotional stress?: None reported Does patient have children?: Yes How many children?: 1 How is patient's relationship with their children?: 20 year old boy, it's good, I miss him  Childhood History:  By whom was/is the patient raised?: Both parents Additional childhood history information: Parent states she was raised by parents in the same home Description of patient's relationship with caregiver when they were a child: It was fine Patient's description of current relationship with people who raised him/her: it's fine How were you disciplined when you got in trouble as a child/adolescent?:  Never got disciplined Does patient have siblings?: Yes Number of Siblings: 10 Description of patient's current relationship with siblings: 6 brothers and 4 sisters Did patient suffer any verbal/emotional/physical/sexual abuse as a child?: No Did patient suffer from severe childhood neglect?: No Has patient ever been sexually abused/assaulted/raped as an adolescent or adult?: No Was the patient ever a victim of a crime or a disaster?: No Witnessed domestic violence?: No Has patient been affected by domestic violence as an adult?: No  Education:  Highest grade of school patient has completed: 11th Currently a Consulting civil engineer?: No Learning disability?: No  Employment/Work Situation:   Employment Situation: Employed Where is Patient Currently Employed?: [employer] How Long has Patient Been Employed?: Approx 1 year Are You Satisfied With Your Job?: Yes Do You Work More Than One Job?: No Work Stressors: None reported Patient's Job has Been Impacted by Current Illness: No What is the Longest Time Patient has Held a Job?: This is probably the longest Where was the Patient Employed at that Time?: [employer] Has Patient ever Been in the [employer]?: No  Financial Resources:   Financial resources: Income from employment, Medicaid Does patient have a representative payee or guardian?: No  Alcohol/Substance Abuse:   What has been your use of drugs/alcohol within the last 12 months?: Patient denies If attempted suicide, did drugs/alcohol play a role in this?: No Alcohol/Substance Abuse Treatment Hx: Denies past history Has alcohol/substance abuse ever caused legal problems?: No  Social Support System:   Conservation officer, nature Support System: Fair Museum/gallery exhibitions officer System: It's okay, my family is there for me Type of faith/religion: None reported How does patient's faith help to cope with current illness?: NA  Leisure/Recreation:   Do You Have Hobbies?: No  Strengths/Needs:    What is the patient's perception of their strengths?: I don't know Patient states  they can use these personal strengths during their treatment to contribute to their recovery: None reported Patient states these barriers may affect/interfere with their treatment: None reported Patient states these barriers may affect their return to the community: None reported  Discharge Plan:   Currently receiving community mental health services: No Patient states concerns and preferences for aftercare planning are: Therapy Patient states they will know when they are safe and ready for discharge when: I'm ready now, my family is waiting for me Does patient have access to transportation?: Yes Does patient have financial barriers related to discharge medications?: No Will patient be returning to same living situation after discharge?: Yes  Summary/Recommendations:   Summary and Recommendations (to be completed by the evaluator): Chadanee Valyncia Wiens is a 27 y.o. female voluntarily admitted to [facility] secondary to [facility] due to [facility] after experiencing a miscarriage. Patient denies any stressors, denies substance use tx or incarceration hx. UDS not conducted. Patient states she is currently living with her mother and two year old son whom she has a good relationship with. Patient states her family is very supportive. Patient reports no stressors regarding employment and states she enjoys her job. Patient's affect was flat and depressed, patient answered with little depth to assessment questions. Patient is open to being connected to therapy services and declined grief counseling services. Patient denies any SI, HI, and AVH.  While here, [place] [name] can benefit from crisis stabilization, medication management, therapeutic milieu, and referrals for services.   Louetta Lame. 10/16/2023

## 2023-10-16 NOTE — Progress Notes (Signed)
(  Sleep Hours) -6.5 (Any PRNs that were needed, meds refused, or side effects to meds)- midnight advil  not given d/ pt sleeping, methergine not available,  (Any disturbances and when (visitation, over night)-none (Concerns raised by the patient)- none (SI/HI/AVH)- denies all

## 2023-10-17 ENCOUNTER — Encounter (HOSPITAL_COMMUNITY): Payer: Self-pay

## 2023-10-17 LAB — CBC WITH DIFFERENTIAL/PLATELET
Abs Immature Granulocytes: 0.04 K/uL (ref 0.00–0.07)
Basophils Absolute: 0 K/uL (ref 0.0–0.1)
Basophils Relative: 1 %
Eosinophils Absolute: 0.2 K/uL (ref 0.0–0.5)
Eosinophils Relative: 3 %
HCT: 36.1 % (ref 36.0–46.0)
Hemoglobin: 11.8 g/dL — ABNORMAL LOW (ref 12.0–15.0)
Immature Granulocytes: 1 %
Lymphocytes Relative: 32 %
Lymphs Abs: 2.5 K/uL (ref 0.7–4.0)
MCH: 28.2 pg (ref 26.0–34.0)
MCHC: 32.7 g/dL (ref 30.0–36.0)
MCV: 86.4 fL (ref 80.0–100.0)
Monocytes Absolute: 0.4 K/uL (ref 0.1–1.0)
Monocytes Relative: 5 %
Neutro Abs: 4.7 K/uL (ref 1.7–7.7)
Neutrophils Relative %: 58 %
Platelets: 336 K/uL (ref 150–400)
RBC: 4.18 MIL/uL (ref 3.87–5.11)
RDW: 13.7 % (ref 11.5–15.5)
Smear Review: NORMAL
WBC: 7.8 K/uL (ref 4.0–10.5)
nRBC: 0 % (ref 0.0–0.2)

## 2023-10-17 LAB — COMPREHENSIVE METABOLIC PANEL WITH GFR
ALT: 14 U/L (ref 0–44)
AST: 19 U/L (ref 15–41)
Albumin: 3.9 g/dL (ref 3.5–5.0)
Alkaline Phosphatase: 100 U/L (ref 38–126)
Anion gap: 13 (ref 5–15)
BUN: 5 mg/dL — ABNORMAL LOW (ref 6–20)
CO2: 23 mmol/L (ref 22–32)
Calcium: 10 mg/dL (ref 8.9–10.3)
Chloride: 105 mmol/L (ref 98–111)
Creatinine, Ser: 0.39 mg/dL — ABNORMAL LOW (ref 0.44–1.00)
GFR, Estimated: >60 mL/min (ref >60)
Glucose, Bld: 107 mg/dL — ABNORMAL HIGH (ref 70–99)
Potassium: 3.4 mmol/L — ABNORMAL LOW (ref 3.5–5.1)
Sodium: 140 mmol/L (ref 135–145)
Total Bilirubin: 0.3 mg/dL (ref 0.0–1.2)
Total Protein: 7.4 g/dL (ref 6.5–8.1)

## 2023-10-17 LAB — URINE CULTURE: Culture: NO GROWTH

## 2023-10-17 LAB — LIPID PANEL
Cholesterol: 197 mg/dL (ref 0–200)
HDL: 45 mg/dL (ref >40)
LDL Cholesterol: 128 mg/dL — ABNORMAL HIGH (ref 0–99)
Total CHOL/HDL Ratio: 4.4 ratio
Triglycerides: 120 mg/dL (ref <150)
VLDL: 24 mg/dL (ref 0–40)

## 2023-10-17 LAB — GC/CHLAMYDIA PROBE AMP (~~LOC~~) NOT AT ARMC
Chlamydia: NEGATIVE
Comment: NEGATIVE
Comment: NORMAL
Neisseria Gonorrhea: NEGATIVE

## 2023-10-17 LAB — VITAMIN B12: Vitamin B-12: 845 pg/mL (ref 180–914)

## 2023-10-17 LAB — HEMOGLOBIN A1C
Hgb A1c MFr Bld: 5 % (ref 4.8–5.6)
Mean Plasma Glucose: 96.8 mg/dL

## 2023-10-17 LAB — TSH: TSH: 1.25 u[IU]/mL (ref 0.350–4.500)

## 2023-10-17 LAB — VITAMIN D 25 HYDROXY (VIT D DEFICIENCY, FRACTURES): Vit D, 25-Hydroxy: 11.51 ng/mL — ABNORMAL LOW (ref 30–100)

## 2023-10-17 NOTE — [facility] Group Notes (Signed)
 [facility] Group Notes:  (Nursing/MHT/Case Management/Adjunct)  Date:  10/17/2023  Time:  12:29 PM  Type of Therapy:  Group Therapy  Participation Level:  Did Not Attend  Participation Quality:  N/A  Affect:  N/A  Cognitive:  N/A  Insight:  None  Engagement in Group:  N/A  Modes of Intervention:  Orientation  Summary of Progress/Problems:   Seniya Stoffers-McCall, LRT,CTRS Rocco Kerkhoff A Quantasia Stegner-McCall 10/17/2023, 12:29 PM

## 2023-10-17 NOTE — Progress Notes (Signed)
   10/17/23 1000  Psychosocial Assessment  Patient Complaints None  Eye Contact Fair  Facial Expression Sad  Affect Depressed  Speech Soft  Interaction Minimal  Motor Activity Other (Comment) (WNL)  Appearance/Hygiene Unremarkable  Behavior Characteristics Cooperative  Mood Sad  Thought Process  Coherency WDL  Content WDL  Delusions None reported or observed  Perception WDL  Hallucination None reported or observed  Judgment WDL  Confusion None  Danger to Self  Current suicidal ideation? Denies

## 2023-10-17 NOTE — Group Note (Signed)
 Recreation Therapy Group Note   Group Topic:Communication  Group Date: 10/17/2023 Start Time: 0935 End Time: 1000 Facilitators: Dama Hedgepeth-McCall, LRT,CTRS Location: [address] Dayroom   Group Topic: Communication, Problem Solving   Goal Area(s) Addresses:  Patient will effectively listen to complete activity.  Patient will identify communication skills used to make activity successful.  Patient will identify how skills used during activity can be used to reach post d/c goals.    Behavioral Response:   Intervention: Building surveyor Activity - Geometric pattern cards, pencils, blank paper    Activity: Geometric Drawings.  Three volunteers from the peer group will be shown an abstract picture with a particular arrangement of geometrical shapes.  Each round, one 'speaker' will describe the pattern, as accurately as possible without revealing the image to the group.  The remaining group members will listen and draw the picture to reflect how it is described to them. Patients with the role of 'listener' cannot ask clarifying questions but, may request that the speaker repeat a direction. Once the drawings are complete, the presenter will show the rest of the group the picture and compare how close each person came to drawing the picture. LRT will facilitate a post-activity discussion regarding effective communication and the importance of planning, listening, and asking for clarification in daily interactions with others.  Education: Environmental consultant, Active listening, Support systems, Discharge planning  Education Outcome: Acknowledges understanding/In group clarification offered/Needs additional education.    Affect/Mood: N/A   Participation Level: Did not attend    Clinical Observations/Individualized Feedback:     Plan: Continue to engage patient in RT group sessions 2-3x/week.   Dara Beidleman-McCall, LRT,CTRS 10/17/2023 10:40 AM

## 2023-10-17 NOTE — BH IP Treatment Plan (Signed)
 Interdisciplinary Treatment and Diagnostic Plan Update  10/17/2023 Time of Session: 10:30AM Tracy Foley MRN: [id number]  Principal Diagnosis: Major depressive disorder, recurrent severe without psychotic features (HCC)  Secondary Diagnoses: Principal Problem:   Major depressive disorder, recurrent severe without psychotic features (HCC) Active Problems:   Grief   Current Medications:  Current Facility-Administered Medications  Medication Dose Route Frequency Provider Last Rate Last Admin   acetaminophen  (TYLENOL ) tablet 650 mg  650 mg Oral Q4H PRN Lord, Jamison Y, NP       benzocaine-Menthol  (DERMOPLAST) 20-0.5 % topical spray 1 Application  1 Application Topical PRN Jacquetta Sharlot GRADE, NP       calcium  carbonate (TUMS - dosed in mg elemental calcium ) chewable tablet 400 mg of elemental calcium   2 tablet Oral Q4H PRN Jacquetta Sharlot GRADE, NP       cefadroxil  (DURICEF) capsule 500 mg  500 mg Oral BID Leigh Corean Massa, MD   500 mg at 10/17/23 9175   coconut oil  1 Application Topical PRN Jacquetta Sharlot GRADE, NP       witch hazel-glycerin  (TUCKS) pad 1 Application  1 Application Topical PRN Jacquetta Sharlot GRADE, NP       And   dibucaine (NUPERCAINAL) 1 % rectal ointment 1 Application  1 Application Rectal PRN Jacquetta Sharlot GRADE, NP       diphenhydrAMINE  (BENADRYL ) capsule 25 mg  25 mg Oral Q6H PRN Jacquetta Sharlot GRADE, NP       diphenhydrAMINE  (BENADRYL ) injection 12.5 mg  12.5 mg Intravenous Q15 min PRN Jacquetta Sharlot GRADE, NP       docusate sodium  (COLACE) capsule 100 mg  100 mg Oral Daily Jacquetta Sharlot GRADE, NP   100 mg at 10/16/23 0848   hydrOXYzine  (ATARAX ) tablet 10 mg  10 mg Oral Q6H PRN Jacquetta Sharlot GRADE, NP       ibuprofen  (ADVIL ) tablet 600 mg  600 mg Oral Q6H Jacquetta Sharlot GRADE, NP   600 mg at 10/16/23 1731   levETIRAcetam  (KEPPRA ) tablet 500 mg  500 mg Oral BID Jacquetta Sharlot GRADE, NP   500 mg at 10/17/23 9175   magnesium  hydroxide (MILK OF MAGNESIA) suspension 30 mL  30 mL Oral Daily PRN Lord, Jamison Y,  NP       OLANZapine  (ZYPREXA ) injection 5 mg  5 mg Intramuscular TID PRN Jacquetta Sharlot GRADE, NP       oxyCODONE -acetaminophen  (PERCOCET/ROXICET) 5-325 MG per tablet 1 tablet  1 tablet Oral Q4H PRN Jacquetta Sharlot GRADE, NP       prenatal multivitamin tablet 1 tablet  1 tablet Oral Q1200 Jacquetta Sharlot GRADE, NP       senna-docusate (Senokot-S) tablet 2 tablet  2 tablet Oral Daily Jacquetta Sharlot GRADE, NP   2 tablet at 10/16/23 0848   sertraline (ZOLOFT) tablet 50 mg  50 mg Oral Daily Jacquetta Sharlot GRADE, NP   50 mg at 10/17/23 9176   simethicone  (MYLICON) chewable tablet 80 mg  80 mg Oral PRN Lord, Jamison Y, NP       PTA Medications: Medications Prior to Admission  Medication Sig Dispense Refill Last Dose/Taking   cefadroxil  (DURICEF) 500 MG capsule Take 1 capsule (500 mg total) by mouth 2 (two) times daily for 7 days.   Taking   levETIRAcetam  (KEPPRA ) 500 MG tablet Take 1 tablet (500 mg total) by mouth 2 (two) times daily. 60 tablet 0 Taking   sertraline (ZOLOFT) 100 MG tablet Take 1 tablet (100 mg total) by mouth daily.  Taking   acetaminophen  (TYLENOL ) 325 MG tablet Take 2 tablets (650 mg total) by mouth every 4 (four) hours as needed (for pain scale < 4).      benzocaine-Menthol  (DERMOPLAST) 20-0.5 % AERO Apply 1 Application topically as needed for irritation (perineal discomfort).      ibuprofen  (ADVIL ) 600 MG tablet Take 1 tablet (600 mg total) by mouth every 6 (six) hours as needed.       Patient Stressors:    Patient Strengths:    Treatment Modalities: Medication Management, Group therapy, Case management,  1 to 1 session with clinician, Psychoeducation, Recreational therapy.   Physician Treatment Plan for Primary Diagnosis: Major depressive disorder, recurrent severe without psychotic features (HCC) Long Term Goal(s): Improvement in symptoms so as ready for discharge   Short Term Goals: Ability to identify changes in lifestyle to reduce recurrence of condition will improve Ability to verbalize  feelings will improve Ability to disclose and discuss suicidal ideas Ability to demonstrate self-control will improve Ability to identify and develop effective coping behaviors will improve Ability to maintain clinical measurements within normal limits will improve Compliance with prescribed medications will improve Ability to identify triggers associated with substance abuse/mental health issues will improve  Medication Management: Evaluate patient's response, side effects, and tolerance of medication regimen.  Therapeutic Interventions: 1 to 1 sessions, Unit Group sessions and Medication administration.  Evaluation of Outcomes: Not Progressing  Physician Treatment Plan for Secondary Diagnosis: Principal Problem:   Major depressive disorder, recurrent severe without psychotic features (HCC) Active Problems:   Grief  Long Term Goal(s): Improvement in symptoms so as ready for discharge   Short Term Goals: Ability to identify changes in lifestyle to reduce recurrence of condition will improve Ability to verbalize feelings will improve Ability to disclose and discuss suicidal ideas Ability to demonstrate self-control will improve Ability to identify and develop effective coping behaviors will improve Ability to maintain clinical measurements within normal limits will improve Compliance with prescribed medications will improve Ability to identify triggers associated with substance abuse/mental health issues will improve     Medication Management: Evaluate patient's response, side effects, and tolerance of medication regimen.  Therapeutic Interventions: 1 to 1 sessions, Unit Group sessions and Medication administration.  Evaluation of Outcomes: Not Progressing   RN Treatment Plan for Primary Diagnosis: Major depressive disorder, recurrent severe without psychotic features (HCC) Long Term Goal(s): Knowledge of disease and therapeutic regimen to maintain health will improve  Short  Term Goals: Ability to remain free from injury will improve, Ability to verbalize frustration and anger appropriately will improve, Ability to demonstrate self-control, Ability to participate in decision making will improve, and Ability to verbalize feelings will improve  Medication Management: RN will administer medications as ordered by provider, will assess and evaluate patient's response and provide education to patient for prescribed medication. RN will report any adverse and/or side effects to prescribing provider.  Therapeutic Interventions: 1 on 1 counseling sessions, Psychoeducation, Medication administration, Evaluate responses to treatment, Monitor vital signs and CBGs as ordered, Perform/monitor CIWA, COWS, AIMS and Fall Risk screenings as ordered, Perform wound care treatments as ordered.  Evaluation of Outcomes: Not Progressing   LCSW Treatment Plan for Primary Diagnosis: Major depressive disorder, recurrent severe without psychotic features (HCC) Long Term Goal(s): Safe transition to appropriate next level of care at discharge, Engage patient in therapeutic group addressing interpersonal concerns.  Short Term Goals: Engage patient in aftercare planning with referrals and resources, Increase social support, Increase ability to appropriately verbalize feelings,  Increase emotional regulation, and Facilitate acceptance of mental health diagnosis and concerns  Therapeutic Interventions: Assess for all discharge needs, 1 to 1 time with Social worker, Explore available resources and support systems, Assess for adequacy in community support network, Educate family and significant other(s) on suicide prevention, Complete Psychosocial Assessment, Interpersonal group therapy.  Evaluation of Outcomes: Not Progressing   Progress in Treatment: Attending groups: No. Participating in groups: No. Taking medication as prescribed: Yes. Toleration medication: Yes. Family/Significant other contact  made: No, will contact:  Orlean Stade (aunt) [phone] Patient understands diagnosis: Yes. Discussing patient identified problems/goals with staff: Yes. Medical problems stabilized or resolved: Yes. Denies suicidal/homicidal ideation: Yes. Issues/concerns per patient self-inventory: No.  Patient Goals:  to find a therapist after I leave here.  Discharge Plan or Barriers: Patient likely to discharge home once stable.   Reason for Continuation of Hospitalization: Depression Medication stabilization  Estimated Length of Stay: 3-5 days  Last 3 [place] Suicide Severity Risk Score: Flowsheet Row [facility] Ad[facility] (Discharged) from 10/13/2023 in [place] [facility] (Discharged[facility]  C-SSRS RISK CATEGORY No Risk No Risk No Risk    Last PHQ 2/9 Scores:    10/05/2023    5:29 PM 01/26/2022    4:52 PM 11/12/2021    3:51 PM  Depression screen PHQ 2/9  Decreased Interest 1 2 2   Down, Depressed, Hopeless 0 2 2  PHQ - 2 Score 1 4 4   Altered sleeping 1 2 1   Tired, decreased energy 2 2 2   Change in appetite 3 1 1   Feeling bad or failure about yourself  1 2 3   Trouble concentrating 0 2 1  Moving slowly or fidgety/restless 0 2 1  Suicidal thoughts 0 0 0  PHQ-9 Score 8 15 13     Scribe for Treatment Team: Luciana Cammarata M Danylah Holden, ISRAEL 10/17/2023 11:29 AM

## 2023-10-17 NOTE — [facility]HH Suicide Risk Assessment (Signed)
 [facility] INPATIENT:  Family/Significant Other Suicide Prevention Education  Suicide Prevention Education:  Education Completed; Orlean Decant (family friend) [phone],  (name of family member/significant other) has been identified by the patient as the family member/significant other with whom the patient will be residing, and identified as the person(s) who will aid the patient in the event of a mental health crisis (suicidal ideations/suicide attempt).  With written consent from the patient, the family member/significant other has been provided the following suicide prevention education, prior to the and/or following the discharge of the patient.  The suicide prevention education provided includes the following: Suicide risk factors Suicide prevention and interventions National Suicide Hotline telephone number [facility] assessment telephone number [facility] and/or Residential Mobile Crisis Unit telephone number  Request made of family/significant other to: Remove weapons (e.g., guns, rifles, knives), all items previously/currently identified as safety concern.   Remove drugs/medications (over-the-counter, prescriptions, illicit drugs), all items previously/currently identified as a safety concern.  Orlean denies any patient access to weapons. Orlean states Rico has a big support system including herself and has no concerns regarding her discharge. Orlean can provide transportation upon patient's discharge depending on the hour. Orlean knows who to contact in the event of a mental health crisis and agrees to assist Rico if this were to occur.   The family member/significant other verbalizes understanding of the suicide prevention education information provided.  The family member/significant other agrees to remove the items of safety concern listed above.  Louetta Lame 10/17/2023, 3:39 PM
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
## 2023-10-17 NOTE — [facility] Group Notes (Signed)
 Spirituality Group   Description: Participant directed exploration of values, beliefs and meaning **Focus on sources of hope & possibility from our own experiences  Following a brief framework of chaplain's role and ground rules of group behavior, participants are invited to share concerns or questions that engage spiritual life. Emphasis placed on common themes and shared experiences and ways to make meaning and clarify living into one's values.   Theory/Process/Goal: Utilize the theoretical framework of group therapy established by Celena Kite, Relational Cultural Theory and Rogerian approaches to facilitate relational empathy and use of the "here and now" to foster reflection, self-awareness, and sharing.   Observations: Tracy Foley was reserved/withdrawn but appeared passively engaged in the group discussion.  [name] L. Delores HERO.Div

## 2023-10-17 NOTE — Plan of Care (Signed)
   Problem: Education: Goal: Knowledge of [place] [facility] Education information/materials will improve Outcome: Progressing

## 2023-10-18 ENCOUNTER — Other Ambulatory Visit: Payer: MEDICAID

## 2023-10-18 ENCOUNTER — Ambulatory Visit: Payer: Self-pay | Admitting: Obstetrics and Gynecology

## 2023-10-18 LAB — RPR: RPR Ser Ql: NONREACTIVE

## 2023-10-18 MED ORDER — LEVETIRACETAM 500 MG PO TABS: 500.0000 mg | ORAL_TABLET | Freq: Two times a day (BID) | ORAL | 0 refills | Status: AC

## 2023-10-18 MED ORDER — WITCH HAZEL-GLYCERIN EX PADS: 1.0000 | MEDICATED_PAD | CUTANEOUS | 12 refills | Status: AC | PRN

## 2023-10-18 MED ORDER — CALCIUM CARBONATE ANTACID 500 MG PO CHEW: 2.0000 | CHEWABLE_TABLET | ORAL | Status: AC | PRN

## 2023-10-18 MED ORDER — SENNOSIDES-DOCUSATE SODIUM 8.6-50 MG PO TABS: 2.0000 | ORAL_TABLET | Freq: Every day | ORAL | Status: AC

## 2023-10-18 MED ORDER — DOCUSATE SODIUM 100 MG PO CAPS: 100.0000 mg | ORAL_CAPSULE | Freq: Every day | ORAL | Status: AC

## 2023-10-18 MED ORDER — SERTRALINE HCL 50 MG PO TABS: 50.0000 mg | ORAL_TABLET | Freq: Every day | ORAL | 0 refills | Status: AC

## 2023-10-18 MED ORDER — PRENATAL MULTIVITAMIN CH: 1.0000 | ORAL_TABLET | Freq: Every day | ORAL | Status: AC

## 2023-10-18 MED ORDER — SIMETHICONE 80 MG PO CHEW: 80.0000 mg | CHEWABLE_TABLET | ORAL | Status: AC | PRN

## 2023-10-18 MED ORDER — COCONUT OIL OIL: 1.0000 | TOPICAL_OIL | Status: AC | PRN

## 2023-10-18 NOTE — Group Note (Signed)
 Recreation Therapy Group Note   Group Topic:Leisure Education  Group Date: 10/18/2023 Start Time: 0930 End Time: 1005 Facilitators: Indira Sorenson-McCall, LRT,CTRS Location: [address] Dayroom   Group Topic: Leisure Education  Goal Area(s) Addresses:  Patient will successfully identify positive leisure and recreation activities.  Patient will acknowledge benefits of participation in healthy leisure activities post discharge.    Behavioral Response: Minimal   Intervention: Competitive Group Game    Activity: Guess the Lyric. In groups or individually, patients will take turns spinning the spinner. Whatever category ([address], Dance, [place], [place], R&B and Hip Hop) the spinner lands on, the individual/group has to fill in the missing song lyric. The spinner also has a section where you can pick the category you want or steal a card from one of the other people/groups. The individual/group with the most cards at the end, wins the game.    Education:  Teacher, English as a foreign language, Leisure as Merchant navy officer, Programmer, applications, Building control surveyor   Education Outcome: Acknowledges education/In group clarification offered/Needs additional education   Affect/Mood: Appropriate   Participation Level: Minimal   Participation Quality: Independent   Behavior: Reserved   Speech/Thought Process: Relevant   Insight: Good   Judgement: Good   Modes of Intervention: Competitive Play   Patient Response to Interventions:  Attentive   Education Outcome:  In group clarification offered    Clinical Observations/Individualized Feedback: Pt was quiet throughout group. Pt was reading a book for the most part. Pt made a few guesses during the activity but was more observant.     Plan: Continue to engage patient in RT group sessions 2-3x/week.   Tracy Foley, LRT,CTRS 10/18/2023 11:31 AM

## 2023-10-18 NOTE — Plan of Care (Signed)
  Problem: Education: Goal: Knowledge of [place] [facility] Education information/materials will improve 10/18/2023 1029 by Deidre Sartorius, RN Outcome: Completed/Met 10/18/2023 0937 by Deidre Sartorius, RN Outcome: Adequate for Discharge Goal: Emotional status will improve Outcome: Completed/Met Goal: Mental status will improve 10/18/2023 1029 by Deidre Sartorius, RN Outcome: Completed/Met 10/18/2023 0937 by Deidre Sartorius, RN Outcome: Progressing Goal: Verbalization of understanding the information provided will improve 10/18/2023 1029 by Deidre Sartorius, RN Outcome: Completed/Met 10/18/2023 0937 by Deidre Sartorius, RN Outcome: Adequate for Discharge   Problem: Activity: Goal: Interest or engagement in activities will improve Outcome: Completed/Met Goal: Sleeping patterns will improve 10/18/2023 1029 by Deidre Sartorius, RN Outcome: Completed/Met 10/18/2023 0937 by Deidre Sartorius, RN Outcome: Progressing   Problem: Coping: Goal: Ability to verbalize frustrations and anger appropriately will improve Outcome: Completed/Met Goal: Ability to demonstrate self-control will improve Outcome: Completed/Met

## 2023-10-18 NOTE — Plan of Care (Signed)
  Problem: Education: Goal: Knowledge of [place] [facility] Education information/materials will improve Outcome: Adequate for Discharge Goal: Mental status will improve Outcome: Progressing Goal: Verbalization of understanding the information provided will improve Outcome: Adequate for Discharge   Problem: Activity: Goal: Sleeping patterns will improve Outcome: Progressing

## 2023-10-18 NOTE — Group Note (Signed)
 LCSW Group Therapy Note   Group Date: 10/18/2023 Start Time: 1100 End Time: 1200   Participation:  patient was present.  She listened and was disruptive but didn't participate in the discussion.  Type of Therapy:  Group Therapy  Topic: Healing Flames: Navigating Anger with Compassion  Objective:  Foster self-awareness and promote compassion toward oneself and others when dealing with anger.  Goals:  Help participants understand the underlying emotions and needs fueling anger. Provide coping strategies for healthier emotional expression and anger management.  Summary: This session explored anger as a volcano--an explosion driven by deeper feelings and unmet needs. Participants learned to identify anger triggers and underlying emotions, then practiced coping strategies like deep breathing, physical activity, and journaling. The group discussed healthy ways to manage anger before it escalates, using both personal reflection and shared experiences.  Therapeutic Modalities: Cognitive Behavioral Therapy (CBT): Challenging thoughts that fuel anger. Mindfulness: Increasing awareness of emotions and sensations.   Maveryck Bahri O Daishon Chui, LCSWA 10/18/2023  12:33 PM

## 2023-10-18 NOTE — Progress Notes (Signed)
   10/18/23 0900  Psych Admission Type (Psych Patients Only)  Admission Status Voluntary  Psychosocial Assessment  Patient Complaints None  Eye Contact Fair  Facial Expression Sad  Affect Sad  Speech Logical/coherent  Interaction Minimal  Motor Activity Other (Comment) (WDl for pt)  Appearance/Hygiene Unremarkable  Behavior Characteristics Cooperative  Mood Sad  Thought Process  Coherency WDL  Content WDL  Delusions None reported or observed  Perception WDL  Hallucination None reported or observed  Judgment WDL  Confusion None  Danger to Self  Current suicidal ideation? Denies  Agreement Not to Harm Self Yes  Description of Agreement verbal  Danger to Others  Danger to Others None reported or observed

## 2023-10-18 NOTE — Group Note (Signed)
 Date:  10/18/2023 Time:  9:22 AM  Group Topic/Focus:  Goals Group:   The focus of this group is to help patients establish daily goals to achieve during treatment and discuss how the patient can incorporate goal setting into their daily lives to aide in recovery. Patients worked independently on worksheets regarding SMART goals, and the group discussed Maslow's hierarchy and its importance for goal setting.    Participation Level:  Did Not Attend  Participation Quality:  N/A  Affect:  N/A  Cognitive:  N/A  Insight: None  Engagement in Group:  None  Modes of Intervention:  N/A  Additional Comments:  Patient did not attend goals group.  Kristi HERO Finian Helvey 10/18/2023, 9:22 AM

## 2023-10-18 NOTE — [facility] Suicide Risk Assessment (Signed)
 Suicide Risk Assessment  Discharge Assessment    [facility] Discharge Suicide Risk Assessment   Principal Problem: Major depressive disorder, recurrent severe without psychotic features (HCC) Discharge Diagnoses: Principal Problem:   Major depressive disorder, recurrent severe without psychotic features (HCC) Active Problems:   Grief   Total Time spent with patient: 30 minutes  Musculoskeletal: Strength & Muscle Tone: within normal limits Gait & Station: normal Patient leans: N/A  Psychiatric Specialty Exam  Presentation  General Appearance:  Casual  Eye Contact: Good  Speech: Clear and Coherent; Normal Rate  Speech Volume: Normal  Handedness:Right   Mood and Affect  Mood: Euthymic  Duration of Depression Symptoms: No data recorded Affect: Appropriate; Full Range   Thought Process  Thought Processes: Coherent; Linear  Descriptions of Associations:Intact  Orientation:Full (Time, Place and Person)  Thought Content:WDL  History of Schizophrenia/Schizoaffective disorder:No data recorded Duration of Psychotic Symptoms:No data recorded Hallucinations:Hallucinations: None  Ideas of Reference:None  Suicidal Thoughts:Suicidal Thoughts: No  Homicidal Thoughts:Homicidal Thoughts: No   Sensorium  Memory: Immediate Good; Recent Good; Remote Good  Judgment: Intact  Insight: Present   Executive Functions  Concentration: Good  Attention Span: Good  Recall: Good  Fund of Knowledge: Good  Language: Good   Psychomotor Activity  Psychomotor Activity:Psychomotor Activity: Normal   Assets  Assets: Communication Skills; Desire for Improvement; Housing; Resilience; Social Support; Talents/Skills   Sleep  Sleep:Sleep: Good  Estimated Sleeping Duration (Last 24 Hours): 8.75-11.00 hours  Physical Exam: Physical Exam ROS Blood pressure 102/73, pulse 61, temperature 98.3 F (36.8 C), temperature source Oral, resp. rate 18, height 4' 11  (1.499 m), weight 55.3 kg, SpO2 99%. Body mass index is 24.64 kg/m.  Mental Status Per Nursing Assessment::   On Admission:  NA  Demographic Factors:  NA  Loss Factors: Loss of significant relationship  Historical Factors: Family history of mental illness or substance abuse and Impulsivity  Risk Reduction Factors:   Responsible for children under 13 years of age, Sense of responsibility to family, Religious beliefs about death, Employed, Living with another person, especially a relative, Positive social support, Positive therapeutic relationship, and Positive coping skills or problem solving skills  Continued Clinical Symptoms:  More than one psychiatric diagnosis Previous Psychiatric Diagnoses and Treatments Medical Diagnoses and Treatments/Surgeries  Cognitive Features That Contribute To Risk:  None    Suicide Risk:  Minimal: No identifiable suicidal ideation.  Patients presenting with no risk factors but with morbid ruminations; may be classified as minimal risk based on the severity of the depressive symptoms. No overt psychotic symptoms. No manic features.  Denies suicidal ideation intent or plan at time of discharge.  Stable for lower level of care.   Follow-up Information     [facility]. Schedule an appointment as soon as possible for a visit on 10/25/2023.   Why: You have a hospital follow up appointment for therapy and medication management services on 10/25/23 at 10:30 am .  The appointment will be Virtual, telehealth. Contact information: [address]  [address] [place] [address] [phone]         [facility]. Call.   Specialty: Hospice and Palliative Medicine Why: You may also call this provider personally if you wish to schedule  grief/bereavement therapy services. Contact information: [address] [facility] [place]  [address] [phone]                Plan Of Care/Follow-up recommendations:  See discharge  summary.  Blair Chiquita Hint, NP 10/18/2023, 10:02 AM

## 2023-10-18 NOTE — Progress Notes (Signed)
  [facility] Adult Case Management Discharge Plan :  Will you be returning to the same living situation after discharge:  Yes,  patient will be returning home to address on file.  At discharge, do you have transportation home?: Yes,  patient will be receiving transportation from Orlean Decant (family friend) at 1pm.  Do you have the ability to pay for your medications: Yes,  patient has active health insurance.  Release of information consent forms completed and in the chart;  Patient's signature needed at discharge.  Patient to Follow up at:  Follow-up Information     [facility]. Schedule an appointment as soon as possible for a visit on 10/25/2023.   Why: You have a hospital follow up appointment for therapy and medication management services on 10/25/23 at 10:30 am .  The appointment will be Virtual, telehealth. Contact information: [address]  [address] [place] [address] [phone]         [facility]. Call.   Specialty: Hospice and Palliative Medicine Why: You may also call this provider personally if you wish to schedule  grief/bereavement therapy services. Contact information: [address] [facility] [place]  [address] [phone]                Next level of care provider has access to [facility] Link:no  Safety Planning and Suicide Prevention discussed: Yes,  completed with Orlean Decant (family friend) [phone]     Has patient been referred to the Quitline?: Patient does not use tobacco/nicotine products  Patient has been referred for addiction treatment: No known substance use disorder.  Louetta Lame, LCSWA 10/18/2023, 9:51 AM
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
## 2023-10-18 NOTE — Progress Notes (Signed)
 Patient ID: Tracy Foley, female   DOB: 23-May-1995, 28 y.o.   MRN: [id number] Medications, follow up appointments and discharge instructions reviewed, pt verbalized understanding. Patient satisfied with belongings, pt discharged home.

## 2023-10-19 LAB — SURGICAL PATHOLOGY

## 2023-10-24 ENCOUNTER — Telehealth (HOSPITAL_COMMUNITY): Payer: Self-pay | Admitting: *Deleted

## 2023-10-24 NOTE — Telephone Encounter (Signed)
 Attempted hospital discharge follow-up call. Left message for patient to return RN call with any questions or concerns. Allean IVAR Carton, RN, 10/24/23, [phone]

## 2023-11-07 ENCOUNTER — Encounter: Payer: MEDICAID | Admitting: Obstetrics and Gynecology

## 2023-11-11 ENCOUNTER — Other Ambulatory Visit: Payer: MEDICAID

## 2023-11-11 ENCOUNTER — Ambulatory Visit: Payer: MEDICAID

## 2023-11-23 ENCOUNTER — Encounter (HOSPITAL_COMMUNITY): Payer: Self-pay | Admitting: Emergency Medicine

## 2023-11-23 ENCOUNTER — Emergency Department (HOSPITAL_COMMUNITY)
Admission: EM | Admit: 2023-11-23 | Discharge: 2023-11-24 | Disposition: A | Discharge status: Home / Self Care | Payer: MEDICAID | Attending: Emergency Medicine | Admitting: Emergency Medicine

## 2023-11-23 ENCOUNTER — Other Ambulatory Visit: Payer: Self-pay

## 2023-11-23 DIAGNOSIS — R109 Unspecified abdominal pain: Secondary | ICD-10-CM | POA: Insufficient documentation

## 2023-11-23 DIAGNOSIS — Z79899 Other long term (current) drug therapy: Secondary | ICD-10-CM | POA: Diagnosis not present

## 2023-11-23 DIAGNOSIS — N939 Abnormal uterine and vaginal bleeding, unspecified: Secondary | ICD-10-CM | POA: Diagnosis present

## 2023-11-23 DIAGNOSIS — J45909 Unspecified asthma, uncomplicated: Secondary | ICD-10-CM | POA: Insufficient documentation

## 2023-11-23 LAB — CBC WITH DIFFERENTIAL/PLATELET
Abs Immature Granulocytes: 0.03 K/uL (ref 0.00–0.07)
Basophils Absolute: 0 K/uL (ref 0.0–0.1)
Basophils Relative: 1 %
Eosinophils Absolute: 0.2 K/uL (ref 0.0–0.5)
Eosinophils Relative: 3 %
HCT: 38.9 % (ref 36.0–46.0)
Hemoglobin: 12.2 g/dL (ref 12.0–15.0)
Immature Granulocytes: 0 %
Lymphocytes Relative: 37 %
Lymphs Abs: 2.7 K/uL (ref 0.7–4.0)
MCH: 28.4 pg (ref 26.0–34.0)
MCHC: 31.4 g/dL (ref 30.0–36.0)
MCV: 90.5 fL (ref 80.0–100.0)
Monocytes Absolute: 0.4 K/uL (ref 0.1–1.0)
Monocytes Relative: 6 %
Neutro Abs: 3.9 K/uL (ref 1.7–7.7)
Neutrophils Relative %: 53 %
Platelets: 304 K/uL (ref 150–400)
RBC: 4.3 MIL/uL (ref 3.87–5.11)
RDW: 13.1 % (ref 11.5–15.5)
WBC: 7.4 K/uL (ref 4.0–10.5)
nRBC: 0 % (ref 0.0–0.2)

## 2023-11-23 LAB — BASIC METABOLIC PANEL WITH GFR
Anion gap: 12 (ref 5–15)
BUN: 7 mg/dL (ref 6–20)
CO2: 20 mmol/L — ABNORMAL LOW (ref 22–32)
Calcium: 9.2 mg/dL (ref 8.9–10.3)
Chloride: 108 mmol/L (ref 98–111)
Creatinine, Ser: 0.67 mg/dL (ref 0.44–1.00)
GFR, Estimated: >60 mL/min (ref >60)
Glucose, Bld: 95 mg/dL (ref 70–99)
Potassium: 3.7 mmol/L (ref 3.5–5.1)
Sodium: 140 mmol/L (ref 135–145)

## 2023-11-23 LAB — HCG, QUANTITATIVE, PREGNANCY: hCG, Beta Chain, Quant, S: 3 m[IU]/mL (ref <5)

## 2023-11-23 NOTE — ED Provider Triage Note (Signed)
 Emergency Medicine Provider Triage Evaluation Note  Tracy Foley , a 28 y.o. female  was evaluated in triage.  Pt complains of miscarriage at 17 weeks, 1 month ago.  Review of Systems  Positive: Vaginal bleeding, pain Negative: Fever, chills, nausea, vomiting  Physical Exam  There were no vitals taken for this visit. Gen:   Awake, no distress   Resp:  Normal effort  MSK:   Moves extremities without difficulty  Other:    Medical Decision Making  Medically screening exam initiated at 7:36 PM.  Appropriate orders placed.  Khalil [name] Pagnotta was informed that the remainder of the evaluation will be completed by another provider, this initial triage assessment does not replace that evaluation, and the importance of remaining in the ED until their evaluation is complete.  Labs ordered   Francis Ileana SAILOR, PA-C 11/23/23 1940

## 2023-11-23 NOTE — ED Triage Notes (Signed)
 Patient here stating she had a miscarriage last month, however still experiencing heavy bleeding. Denies fevers, chills, shortness of breath. Endorses intermittent abdominal cramping. States she is seeing larger sizes clots/ tissue?

## 2023-11-24 ENCOUNTER — Emergency Department (HOSPITAL_COMMUNITY): Payer: MEDICAID

## 2023-11-24 LAB — URINALYSIS, ROUTINE W REFLEX MICROSCOPIC
Bilirubin Urine: NEGATIVE
Glucose, UA: NEGATIVE mg/dL
Ketones, ur: 5 mg/dL — AB
Leukocytes,Ua: NEGATIVE
Nitrite: POSITIVE — AB
Protein, ur: 30 mg/dL — AB
Specific Gravity, Urine: 1.018 (ref 1.005–1.030)
pH: 5 (ref 5.0–8.0)

## 2023-11-24 MED ORDER — CEPHALEXIN 250 MG PO CAPS
500.0000 mg | ORAL_CAPSULE | Freq: Once | ORAL | Status: AC
  Administered 2023-11-24: 500 mg via ORAL
  Filled 2023-11-24: qty 2

## 2023-11-24 MED ORDER — SODIUM CHLORIDE 0.9 % IV SOLN: 2.0000 g | Freq: Once | INTRAVENOUS | Status: DC

## 2023-11-24 MED ORDER — CEPHALEXIN 500 MG PO CAPS: 500.0000 mg | ORAL_CAPSULE | Freq: Four times a day (QID) | ORAL | 0 refills | Status: AC

## 2023-11-24 NOTE — ED Notes (Signed)
 Patient transported to Ultrasound

## 2023-11-24 NOTE — ED Provider Notes (Signed)
 [place] [facility] Provider Note   CSN: [id number] Arrival date & time: 11/23/23  1924     Patient presents with: Vaginal Bleeding   Tracy Foley is a 28 y.o. female with history of acute blood loss anemia, concussion, asthma, chronic UTI, seizure disorder, chronic thoracic back pain.  Presents to ED complaining of vaginal bleeding.  States that back on 10/11 she was admitted to hospital for premature cervical dilation in second trimester with resulting intrauterine fetal demise.  Patient was admitted to hospital and ultimately discharged and sent to [facility] after she endorsed suicidal ideation on her admission related to losing her pregnancy.  She presents to ED this evening complaining of vaginal bleeding.  States that she has had vaginal bleeding ever since having an abortion back in October.  She also complains of burning with urination which been present for about 1 week after having abortion.  She denies any nausea, vomiting, abdominal pain.  She denies any fevers at home.  She states vaginal bleeding has been ongoing for some time but she denies that she is going through more than 1 pad per hour.  She denies lightheadedness or dizziness.  Denies chest pain or shortness of breath.  States that she was evaluated for STIs and the hospital on initial admission which were all negative.  She denies that there is a concern for STI.  She denies any vaginal discharge.   Vaginal Bleeding      Prior to Admission medications   Medication Sig Start Date End Date Taking? Authorizing Provider  cephALEXin (KEFLEX) 500 MG capsule Take 1 capsule (500 mg total) by mouth 4 (four) times daily. 11/24/23  Yes Ruthell Lonni FALCON, PA-C  benzocaine-Menthol  (DERMOPLAST) 20-0.5 % AERO Apply 1 Application topically as needed for irritation (perineal discomfort). 10/15/23   Leftwich-Kirby, Olam LABOR, CNM  calcium  carbonate (TUMS - DOSED IN MG ELEMENTAL CALCIUM ) 500 MG chewable  tablet Chew 2 tablets (400 mg of elemental calcium  total) by mouth every 4 (four) hours as needed for indigestion. 10/18/23   Bennett, Christal H, NP  coconut oil OIL Apply 1 Application topically as needed. 10/18/23   Bennett, Christal H, NP  docusate sodium  (COLACE) 100 MG capsule Take 1 capsule (100 mg total) by mouth daily. 10/19/23   Bennett, Christal H, NP  ibuprofen  (ADVIL ) 600 MG tablet Take 1 tablet (600 mg total) by mouth every 6 (six) hours as needed. 10/15/23   Leftwich-Kirby, Olam LABOR, CNM  levETIRAcetam  (KEPPRA ) 500 MG tablet Take 1 tablet (500 mg total) by mouth 2 (two) times daily for 14 days. 10/18/23 11/01/23  Blair Chiquita DEL, NP  Prenatal Vit-Fe Fumarate-FA (PRENATAL MULTIVITAMIN) TABS tablet Take 1 tablet by mouth daily at 12 noon. 10/18/23   Bennett, Christal H, NP  senna-docusate (SENOKOT-S) 8.6-50 MG tablet Take 2 tablets by mouth daily. 10/19/23   Bennett, Christal H, NP  sertraline (ZOLOFT) 50 MG tablet Take 1 tablet (50 mg total) by mouth daily. 10/19/23   Blair, Christal H, NP  simethicone  (MYLICON) 80 MG chewable tablet Chew 1 tablet (80 mg total) by mouth as needed for flatulence. 10/18/23   Blair Chiquita DEL, NP  witch hazel-glycerin  (TUCKS) pad Apply 1 Application topically as needed for hemorrhoids. 10/18/23   Blair Chiquita DEL, NP    Allergies: Patient has no known allergies.    Review of Systems  Genitourinary:  Positive for vaginal bleeding.  All other systems reviewed and are negative.   Updated Vital Signs  BP 104/74   Pulse 68   Temp 97.7 F (36.5 C) (Oral)   Resp 16   Ht 4' 11 (1.499 m)   Wt 55 kg   SpO2 100%   BMI 24.49 kg/m   Physical Exam  (all labs ordered are listed, but only abnormal results are displayed) Labs Reviewed  BASIC METABOLIC PANEL WITH GFR - Abnormal; Notable for the following components:      Result Value   CO2 20 (*)    All other components within normal limits  URINALYSIS, ROUTINE W REFLEX MICROSCOPIC -  Abnormal; Notable for the following components:   APPearance HAZY (*)    Hgb urine dipstick LARGE (*)    Ketones, ur 5 (*)    Protein, ur 30 (*)    Nitrite POSITIVE (*)    Bacteria, UA MANY (*)    All other components within normal limits  URINE CULTURE  CBC WITH DIFFERENTIAL/PLATELET  HCG, QUANTITATIVE, PREGNANCY    EKG: None  Radiology: US  PELVIC COMPLETE W TRANSVAGINAL AND TORSION R/O Result Date: 11/24/2023 EXAM: US  Pelvis, Complete Transvaginal and Transabdominal with Doppler 11/24/2023 04:49:22 AM TECHNIQUE: Transabdominal and transvaginal pelvic duplex ultrasound using B-mode/gray scaled imaging with Doppler spectral analysis and color flow was obtained. COMPARISON: None available CLINICAL HISTORY: Pelvic pain status post intrauterine fetal demise FINDINGS: UTERUS: Uterus measures 7.8 x 4.7 x 6.7 cm. Uterus demonstrates normal myometrial echotexture. ENDOMETRIAL STRIPE: Endometrial stripe measures 0.8 cm. Small amount of fluid identified within the endometrial cavity. RIGHT OVARY: Right ovary measures 2.7 x 1.6 x 1.5 cm. Right ovary is within normal limits. There is normal arterial and venous Doppler flow. LEFT OVARY: Left ovary measures 3.1 x 1.6 x 1.4 cm. Left ovary is within normal limits. There is normal arterial and venous Doppler flow. FREE FLUID: Trace free fluid. IMPRESSION: 1. Small amount of fluid within the endometrial cavity, which may reflect expected post-event changes in the setting of recent intrauterine fetal demise; no acute complication identified. 2. No signs of retained products of conception. Electronically signed by: Waddell Calk MD 11/24/2023 06:10 AM EST RP Workstation: [id number]    Procedures   Medications Ordered in the ED  cephALEXin  (KEFLEX ) capsule 500 mg (500 mg Oral Given 11/24/23 0507)     Medical Decision Making Amount and/or Complexity of Data Reviewed Radiology: ordered.   28 year old female presents for evaluation of vaginal bleeding.   Patient is status post premature rupture of membranes with intrauterine fetal demise 1 month ago.   On exam patient is in no apparent distress.  She is afebrile and nontachycardic.  Her lung sounds are clear bilaterally, no hypoxia.  Abdomen is soft and compressible.  Neuroexam at baseline.  Overall she is nontoxic in appearance with reassuring vital signs.  Labs collected in triage include CBC, BMP, quantitative hCG, urinalysis.  I have added on ultrasound imaging.  CBC is without leukocytosis, hemoglobin is stable at 12.2.  Her metabolic panel is grossly unremarkable.  Quantitative hCG is 3.  Urinalysis shows large hemoglobin, nitrite positive urine and many bacteria.  Urine cultured.  Patient started on Keflex  which she will take 4 times a day for the next 10 days.  Ultrasound imaging shows no retained products of conception.  There is a small amount of fluid within the endometrial cavity which may reflect expected postevent changes in the setting of recent intrauterine fetal demise however no acute complications noted.   Patient advised of exam findings.  Patient was advised to follow-up with the  [facility] at [facility].  She was advised that if she begins to go through 2 pads an hour to return to ED for further care and reevaluation and she voiced understanding.  She was advised that she will need to follow-up for reevaluation at the women Center in the next 3 to 5 days and she voiced understanding.  She was advised that we will culture her urine.  She had opportunity did ask questions and all questions were answered to the patient satisfaction.  She is stable to discharge at this time.  Discussed with Dr. Haze prior to discharge who voices agreement with plan of management.    Final diagnoses:  Vaginal bleeding    ED Discharge Orders          Ordered    cephALEXin (KEFLEX) 500 MG capsule  4 times daily        11/24/23 0624               Jaz Laningham F,  PA-C 11/24/23 9370    Haze Lonni PARAS, MD 11/24/23 2302

## 2023-11-24 NOTE — Discharge Instructions (Addendum)
 As we discussed, continued bleeding could be result of recent miscarriage.  Please follow-up with the [facility] [place] in the next 3 to 5 days for reevaluation.  If you develop any new or worsening symptoms please return to the ED for further care.  For your UTI, please begin taking Keflex 4 times a day for 10 days.  Please return to the ED with new symptoms.

## 2023-11-26 LAB — URINE CULTURE: Culture: >=100000 — AB

## 2023-11-27 ENCOUNTER — Telehealth (HOSPITAL_BASED_OUTPATIENT_CLINIC_OR_DEPARTMENT_OTHER): Payer: Self-pay | Admitting: *Deleted

## 2023-11-27 NOTE — Telephone Encounter (Signed)
 Post ED Visit - Positive Culture Follow-up  Culture report reviewed by antimicrobial stewardship pharmacist: [facility] []  Rankin Dee, Pharm.D. []  Venetia Gully, Pharm.D., BCPS AQ-ID []  Garrel Crews, Pharm.D., BCPS []  Almarie Lunger, [address].D., BCPS []  [place], [place].D., BCPS, AAHIVP []  Rosaline Bihari, Pharm.D., BCPS, AAHIVP []  Vernell Meier, PharmD, BCPS []  Latanya Hint, PharmD, BCPS []  Donald Medley, PharmD, BCPS []  Rocky Bold, PharmD []  Dorothyann Alert, PharmD, BCPS [x]  Dorn Buttner, , PharmD  [facility] []  Rosaline Edison, PharmD []  Romona Bliss, PharmD []  Dolphus Roller, PharmD []  Veva Seip, Rph []  Vernell Daunt) Leonce, PharmD []  Eva Allis, PharmD []  Rosaline Millet, PharmD []  Iantha Batch, PharmD []  Arvin Gauss, PharmD []  Wanda Hasting, PharmD []  Ronal Rav, PharmD []  Rocky Slade, PharmD []  Bard Jeans, PharmD   Positive urine culture Treated with Cephalexin , organism sensitive to the same and no further patient follow-up is required at this time.  [name] 11/27/2023, 2:02 PM

## 2023-11-28 ENCOUNTER — Ambulatory Visit: Payer: MEDICAID | Admitting: Obstetrics and Gynecology

## 2023-12-25 ENCOUNTER — Emergency Department (HOSPITAL_COMMUNITY)
Admission: EM | Admit: 2023-12-25 | Discharge: 2023-12-25 | Discharge status: Against Medical Advice | Payer: MEDICAID | Attending: Emergency Medicine | Admitting: Emergency Medicine

## 2023-12-25 DIAGNOSIS — Z5321 Procedure and treatment not carried out due to patient leaving prior to being seen by health care provider: Secondary | ICD-10-CM | POA: Diagnosis not present

## 2023-12-25 DIAGNOSIS — Z760 Encounter for issue of repeat prescription: Secondary | ICD-10-CM | POA: Insufficient documentation

## 2023-12-25 NOTE — ED Triage Notes (Addendum)
 Pt arrived via [employer] for psych eval and med refill. Pt denied she is having SI or HI. Pt stated she just wants help with changing her anti-depression meds. Pt speech is unorganized. Pt is currently on the phone and seems distracted. Pt stated she also needs refills for keppra .

## 2023-12-25 NOTE — ED Notes (Signed)
 Pt was seen by Security leaving ED. Pt eloped.

## 2024-01-11 ENCOUNTER — Ambulatory Visit: Payer: MEDICAID
